# Patient Record
Sex: Female | Born: 1940 | Race: White | Hispanic: No | Marital: Married | Smoking: Former smoker
Health system: Southern US, Community
[De-identification: ages and names within clinical notes are randomized; demographics above are authoritative.]

## PROBLEM LIST (undated history)

## (undated) DIAGNOSIS — I10 Essential (primary) hypertension: Secondary | ICD-10-CM

## (undated) DIAGNOSIS — Z8719 Personal history of other diseases of the digestive system: Secondary | ICD-10-CM

## (undated) DIAGNOSIS — K219 Gastro-esophageal reflux disease without esophagitis: Secondary | ICD-10-CM

## (undated) DIAGNOSIS — N393 Stress incontinence (female) (male): Secondary | ICD-10-CM

## (undated) DIAGNOSIS — E785 Hyperlipidemia, unspecified: Secondary | ICD-10-CM

## (undated) DIAGNOSIS — H269 Unspecified cataract: Secondary | ICD-10-CM

## (undated) DIAGNOSIS — T7840XA Allergy, unspecified, initial encounter: Secondary | ICD-10-CM

## (undated) DIAGNOSIS — K759 Inflammatory liver disease, unspecified: Secondary | ICD-10-CM

## (undated) DIAGNOSIS — I701 Atherosclerosis of renal artery: Secondary | ICD-10-CM

## (undated) DIAGNOSIS — N952 Postmenopausal atrophic vaginitis: Secondary | ICD-10-CM

## (undated) DIAGNOSIS — M169 Osteoarthritis of hip, unspecified: Secondary | ICD-10-CM

## (undated) DIAGNOSIS — M1990 Unspecified osteoarthritis, unspecified site: Secondary | ICD-10-CM

## (undated) HISTORY — DX: Essential (primary) hypertension: I10

## (undated) HISTORY — PX: EYE SURGERY: SHX253

## (undated) HISTORY — PX: KNEE ARTHROSCOPY: SHX127

## (undated) HISTORY — PX: OTHER SURGICAL HISTORY: SHX169

## (undated) HISTORY — DX: Hyperlipidemia, unspecified: E78.5

## (undated) HISTORY — DX: Unspecified cataract: H26.9

## (undated) HISTORY — PX: CATARACT EXTRACTION: SUR2

## (undated) HISTORY — DX: Stress incontinence (female) (male): N39.3

## (undated) HISTORY — DX: Postmenopausal atrophic vaginitis: N95.2

## (undated) HISTORY — DX: Allergy, unspecified, initial encounter: T78.40XA

## (undated) HISTORY — DX: Atherosclerosis of renal artery: I70.1

---

## 1978-02-21 HISTORY — PX: AUGMENTATION MAMMAPLASTY: SUR837

## 1993-02-21 HISTORY — PX: CARPAL TUNNEL RELEASE: SHX101

## 2000-05-30 ENCOUNTER — Ambulatory Visit (HOSPITAL_BASED_OUTPATIENT_CLINIC_OR_DEPARTMENT_OTHER): Admission: RE | Admit: 2000-05-30 | Discharge: 2000-05-30 | Payer: Self-pay | Admitting: Orthopedic Surgery

## 2000-11-27 ENCOUNTER — Other Ambulatory Visit: Admission: RE | Admit: 2000-11-27 | Discharge: 2000-11-27 | Payer: Self-pay | Admitting: Obstetrics and Gynecology

## 2001-12-10 ENCOUNTER — Other Ambulatory Visit: Admission: RE | Admit: 2001-12-10 | Discharge: 2001-12-10 | Payer: Self-pay | Admitting: Obstetrics and Gynecology

## 2002-11-05 ENCOUNTER — Encounter: Admission: RE | Admit: 2002-11-05 | Discharge: 2002-11-05 | Payer: Self-pay | Admitting: Sports Medicine

## 2002-12-03 ENCOUNTER — Encounter: Admission: RE | Admit: 2002-12-03 | Discharge: 2002-12-03 | Payer: Self-pay | Admitting: Sports Medicine

## 2002-12-11 ENCOUNTER — Encounter: Payer: Self-pay | Admitting: Sports Medicine

## 2002-12-11 ENCOUNTER — Encounter: Admission: RE | Admit: 2002-12-11 | Discharge: 2002-12-11 | Payer: Self-pay | Admitting: Sports Medicine

## 2002-12-31 ENCOUNTER — Encounter: Admission: RE | Admit: 2002-12-31 | Discharge: 2002-12-31 | Payer: Self-pay | Admitting: Sports Medicine

## 2003-03-18 ENCOUNTER — Encounter: Admission: RE | Admit: 2003-03-18 | Discharge: 2003-03-18 | Payer: Self-pay | Admitting: Sports Medicine

## 2003-03-26 ENCOUNTER — Other Ambulatory Visit: Admission: RE | Admit: 2003-03-26 | Discharge: 2003-03-26 | Payer: Self-pay | Admitting: Obstetrics and Gynecology

## 2003-04-01 ENCOUNTER — Ambulatory Visit (HOSPITAL_COMMUNITY): Admission: RE | Admit: 2003-04-01 | Discharge: 2003-04-01 | Payer: Self-pay | Admitting: Gastroenterology

## 2003-05-05 ENCOUNTER — Ambulatory Visit (HOSPITAL_COMMUNITY): Admission: RE | Admit: 2003-05-05 | Discharge: 2003-05-05 | Payer: Self-pay | Admitting: Nephrology

## 2003-05-05 ENCOUNTER — Encounter: Payer: Self-pay | Admitting: Nephrology

## 2003-05-22 ENCOUNTER — Ambulatory Visit (HOSPITAL_COMMUNITY): Admission: RE | Admit: 2003-05-22 | Discharge: 2003-05-22 | Payer: Self-pay | Admitting: Nephrology

## 2004-05-10 ENCOUNTER — Ambulatory Visit (HOSPITAL_COMMUNITY): Admission: RE | Admit: 2004-05-10 | Discharge: 2004-05-10 | Payer: Self-pay | Admitting: Family Medicine

## 2004-05-13 ENCOUNTER — Other Ambulatory Visit: Admission: RE | Admit: 2004-05-13 | Discharge: 2004-05-13 | Payer: Self-pay | Admitting: Addiction Medicine

## 2004-10-06 ENCOUNTER — Other Ambulatory Visit: Admission: RE | Admit: 2004-10-06 | Discharge: 2004-10-06 | Payer: Self-pay | Admitting: Obstetrics and Gynecology

## 2004-12-22 ENCOUNTER — Other Ambulatory Visit: Admission: RE | Admit: 2004-12-22 | Discharge: 2004-12-22 | Payer: Self-pay | Admitting: Obstetrics and Gynecology

## 2005-05-04 ENCOUNTER — Ambulatory Visit (HOSPITAL_COMMUNITY): Admission: RE | Admit: 2005-05-04 | Discharge: 2005-05-04 | Payer: Self-pay | Admitting: Nephrology

## 2005-06-09 ENCOUNTER — Other Ambulatory Visit: Admission: RE | Admit: 2005-06-09 | Discharge: 2005-06-09 | Payer: Self-pay | Admitting: Obstetrics and Gynecology

## 2005-09-13 ENCOUNTER — Emergency Department (HOSPITAL_COMMUNITY): Admission: EM | Admit: 2005-09-13 | Discharge: 2005-09-13 | Payer: Self-pay | Admitting: Family Medicine

## 2006-02-21 HISTORY — PX: OTHER SURGICAL HISTORY: SHX169

## 2006-02-27 ENCOUNTER — Ambulatory Visit: Payer: Self-pay

## 2006-06-14 ENCOUNTER — Other Ambulatory Visit: Admission: RE | Admit: 2006-06-14 | Discharge: 2006-06-14 | Payer: Self-pay | Admitting: Obstetrics and Gynecology

## 2006-09-11 ENCOUNTER — Ambulatory Visit: Payer: Self-pay

## 2007-03-07 ENCOUNTER — Other Ambulatory Visit: Admission: RE | Admit: 2007-03-07 | Discharge: 2007-03-07 | Payer: Self-pay | Admitting: Obstetrics and Gynecology

## 2007-03-26 ENCOUNTER — Ambulatory Visit: Payer: Self-pay

## 2007-12-21 ENCOUNTER — Encounter: Payer: Self-pay | Admitting: Women's Health

## 2007-12-21 ENCOUNTER — Other Ambulatory Visit: Admission: RE | Admit: 2007-12-21 | Discharge: 2007-12-21 | Payer: Self-pay | Admitting: Obstetrics and Gynecology

## 2007-12-21 ENCOUNTER — Ambulatory Visit: Payer: Self-pay | Admitting: Women's Health

## 2008-03-10 ENCOUNTER — Ambulatory Visit: Payer: Self-pay

## 2008-07-17 ENCOUNTER — Ambulatory Visit: Payer: Self-pay | Admitting: Internal Medicine

## 2008-11-11 ENCOUNTER — Ambulatory Visit: Payer: Self-pay | Admitting: Obstetrics and Gynecology

## 2009-01-05 ENCOUNTER — Ambulatory Visit: Payer: Self-pay | Admitting: Internal Medicine

## 2009-02-05 ENCOUNTER — Ambulatory Visit: Payer: Self-pay | Admitting: Internal Medicine

## 2009-04-20 ENCOUNTER — Encounter: Payer: Self-pay | Admitting: Cardiovascular Disease

## 2009-04-20 ENCOUNTER — Ambulatory Visit: Payer: Self-pay

## 2009-04-20 DIAGNOSIS — N183 Chronic kidney disease, stage 3 unspecified: Secondary | ICD-10-CM | POA: Insufficient documentation

## 2009-04-30 ENCOUNTER — Ambulatory Visit: Payer: Self-pay | Admitting: Women's Health

## 2009-04-30 ENCOUNTER — Other Ambulatory Visit: Admission: RE | Admit: 2009-04-30 | Discharge: 2009-04-30 | Payer: Self-pay | Admitting: Obstetrics and Gynecology

## 2009-05-11 ENCOUNTER — Ambulatory Visit: Payer: Self-pay | Admitting: Sports Medicine

## 2009-05-11 DIAGNOSIS — M7520 Bicipital tendinitis, unspecified shoulder: Secondary | ICD-10-CM | POA: Insufficient documentation

## 2009-05-11 DIAGNOSIS — M25519 Pain in unspecified shoulder: Secondary | ICD-10-CM | POA: Insufficient documentation

## 2009-05-20 ENCOUNTER — Telehealth (INDEPENDENT_AMBULATORY_CARE_PROVIDER_SITE_OTHER): Payer: Self-pay | Admitting: *Deleted

## 2009-06-30 ENCOUNTER — Ambulatory Visit: Payer: Self-pay | Admitting: Internal Medicine

## 2009-11-21 DEATH — deceased

## 2010-01-22 ENCOUNTER — Ambulatory Visit: Payer: Self-pay | Admitting: Internal Medicine

## 2010-02-01 ENCOUNTER — Ambulatory Visit: Payer: Self-pay | Admitting: Internal Medicine

## 2010-02-23 ENCOUNTER — Ambulatory Visit: Admit: 2010-02-23 | Payer: Self-pay

## 2010-02-23 ENCOUNTER — Ambulatory Visit: Admission: RE | Admit: 2010-02-23 | Discharge: 2010-02-23 | Payer: Self-pay | Source: Home / Self Care

## 2010-02-23 DIAGNOSIS — M2420 Disorder of ligament, unspecified site: Secondary | ICD-10-CM | POA: Insufficient documentation

## 2010-02-23 DIAGNOSIS — M629 Disorder of muscle, unspecified: Secondary | ICD-10-CM | POA: Insufficient documentation

## 2010-02-23 DIAGNOSIS — M25559 Pain in unspecified hip: Secondary | ICD-10-CM | POA: Insufficient documentation

## 2010-03-01 ENCOUNTER — Encounter (INDEPENDENT_AMBULATORY_CARE_PROVIDER_SITE_OTHER): Payer: Self-pay | Admitting: *Deleted

## 2010-03-01 ENCOUNTER — Telehealth (INDEPENDENT_AMBULATORY_CARE_PROVIDER_SITE_OTHER): Payer: Self-pay | Admitting: *Deleted

## 2010-03-09 ENCOUNTER — Ambulatory Visit: Admit: 2010-03-09 | Payer: Self-pay | Admitting: Family Medicine

## 2010-03-09 ENCOUNTER — Ambulatory Visit
Admission: RE | Admit: 2010-03-09 | Discharge: 2010-03-09 | Payer: Self-pay | Source: Home / Self Care | Attending: Family Medicine | Admitting: Family Medicine

## 2010-03-09 ENCOUNTER — Encounter
Admission: RE | Admit: 2010-03-09 | Discharge: 2010-03-09 | Payer: Self-pay | Source: Home / Self Care | Attending: Family Medicine | Admitting: Family Medicine

## 2010-03-09 DIAGNOSIS — M549 Dorsalgia, unspecified: Secondary | ICD-10-CM | POA: Insufficient documentation

## 2010-03-14 ENCOUNTER — Encounter: Payer: Self-pay | Admitting: Family Medicine

## 2010-03-14 ENCOUNTER — Encounter: Payer: Self-pay | Admitting: Nephrology

## 2010-03-23 NOTE — Progress Notes (Signed)
  FAxed Doppler over to [facility] fax [phone] [facility]  May 20, 2009 3:47 PM

## 2010-03-23 NOTE — Assessment & Plan Note (Signed)
Summary: R BICEP PAIN X MARCH 4TH   Vital Signs:  Patient profile:   70 year old female Height:      61 inches Weight:      138 pounds BMI:     26.17 BP sitting:   119 / 76  Vitals Entered By: Lillia Pauls CMA (May 11, 2009 9:14 AM)  History of Present Illness: Charlotte Duarte had a sudden jerk of her arm while holding a horse on march 3 she had pain, swelling and bruising into her RT upper arm pain is much better and discoloration is almost faded comes for eval feels that RT arm is weak now has some pain up into shoulder as well - more post  Physical Exam  General:  Well-developed,well-nourished,in no acute distress; alert,appropriate and cooperative throughout examination Msk:  Inspection reveals no abnormalities or assymetry; no atrophy noted; palpation is unremarkable;  ROM is full in all planes. specific strength testing of Rotator cuff mm reveals good strength throughout; no signs of impingement;  speeds and yergason's tests mild pain but uncomfortable to any strong resistance     negative painful arc and no drop arm sign.  ER was somewhat painful to testing and seems weaker than left  RT humerus shows some discoloration in mid arm tender at distal biceps tendon and actually less so at prox biceps tendon  Additional Exam:  MSK [place] There is a split biceps tendon noted in the groove no excessive edema in that area about 4 cms distal there is fluid lining tendon sheath there is a discreet area of calcifcation in tendon and a fluid pocket with 3 calcifications  note quick scan of Infraspinatus reveals a rounded calcific density in the MM belly distally  images saved   Impression & Recommendations:  Problem # 1:  SHOULDER PAIN, RIGHT (ICD-719.41)  This is upper shoulder and arm I think most of sxs are referred from biceps I think findings in RC are from older chronic probs - I did inject her in past years for SAB  Orders: [place] LIMITED ([id number])  consider full shoulde scan  with [place] if sxs persist  Problem # 2:  BICEPS TENDINITIS, RIGHT (ICD-726.12)  Calcific change and swelling in tendon sheath suspect this is a healing partial tear no hx of abnorm calcification with her renal Dz which is not problematic at this point  Will give her a series of biceps exercises - theraband x 2 wks progress to lt weight p 2 wks ice if needed as needed pain meds  reck 1 mo to repeat scan to see if swelling resolving  should see steady progress with strength  Orders: [place] LIMITED ([id number])

## 2010-03-23 NOTE — Miscellaneous (Signed)
Summary: Orders Update  Clinical Lists Changes  Problems: Added new problem of RENAL DISEASE, CHRONIC, STAGE III (ICD-585.3) Orders: Added new Test order of Renal Artery Duplex (Renal Artery Duplex) - Signed 

## 2010-03-25 NOTE — Assessment & Plan Note (Signed)
Summary: It band problem x 3 wks/bmc   Vital Signs:  Patient profile:   70 year old female Height:      60 inches Weight:      145 pounds BMI:     28.42 Pulse rate:   76 / minute BP sitting:   112 / 75  (right arm)  Vitals Entered By: Lillia Pauls CMA (February 23, 2010 2:10 PM)  History of Present Illness: 3 weeks of increasing dull aching pain in R posterior and lateral hip that radiates down somewhat and is associated with R shin pain.  No numbness or tingling.  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    Pain limits activity to 23m/day walk.  Cannot hike or do 11m walk, gardening.    No NSAIDs normally but 3 ibuprofen in last 3 weeks. (reduced GFR baseline)  Heat does not help but ice and stretching help some.  Did some deep tissue myofascial work with Ms. Grant Ruts, 3 sessions, helped some. Has been doing some self massage with a tennis ball, R posterior buttock.  Past History:  Past medical, surgical, family and social histories (including risk factors) reviewed, and no changes noted (except as noted below).  Past Medical History: reduced GFR biceps tendonitis  Family History: Reviewed history and no changes required.  Social History: Reviewed history and no changes required. 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 Otherwise the pertinent positives of the ROS are noted above.    Physical Exam  General:  Well-developed,well-nourished,in no acute distress; alert,appropriate and cooperative throughout examination Head:  Normocephalic and atraumatic without obvious abnormalities. No apparent alopecia  or balding. Ears:  no external deformities.   Nose:  no external deformity.   Neck:  No deformities, masses, or tenderness noted. Lungs:  normal respiratory effort.   Msk:  Full ROM in back with no pain on palpation.  R Hip: Some limitation of ROM in R hip internal and external rotation. Slight on abduction, but a negative C sign   Pain along medial aspect with external rotation.    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  Will give exercise regimen to strengthen hip and leg abductors.  >25 minutes spent in face to face time with patient, >50% spent in counselling or coordination of care  The patient was given a handout from Dr. Ailene Ards book "The Sports Medicine Patient Advisor" describing the anatomy and rehabilitation of the following condition: ITBS, new book  also hip flexion, abd, ext  I think this is secondary, poor pelvic control, weak abductors, ITBS, TFL irritation.  Problem # 2:  HIP PAIN, RIGHT (ICD-719.45) Assessment: New   Orders Added: 1)  Est. Patient Level IV [16109]

## 2010-03-25 NOTE — Miscellaneous (Signed)
Summary: Orders Update  Clinical Lists Changes  Problems: Added new problem of RENAL DISEASE, CHRONIC, STAGE III (ICD-585.3) Orders: Added new Test order of Renal Artery Duplex (Renal Artery Duplex) - Signed 

## 2010-03-25 NOTE — Progress Notes (Signed)
  Per pt she would like to try tramadol to have something on had for severe pain.  [facility] [place] [facility].   ---- Converted from flag ---- ---- 03/01/2010 2:57 PM, Hannah Beat MD wrote: with a GFR of 37, i would avoid the routine NSAIDS. I am not sure how helpful they will be with her problem. Encourage continued rehab and massage, strengthening to help long term. Tylenol OK.  Tramadol 50 mg, 1 by mouth  4 times daily  #50 if needed for pain 4 times daily    ---- 03/01/2010 2:52 PM, Rochele Pages RN wrote: Her creat as of 01/19/10 was 1.4 and GFR was 37   ---- 03/01/2010 9:39 AM, Hannah Beat MD wrote: find out what her creatinine and GFR are -- i think she will know what they are. if not, try to find by calling the lab.  ---- 03/01/2010 9:31 AM, Jceon Alverio Jake Shark RN wrote: Pt called states her ITB pain is better at night, but worse during the day now.  States she feels weakness in hip and knee.  Has been compliant with exercises since appt.  She says NSAIDs are very helpful for her discomfort, but she has only taken 4 ibuprofen in the past 2 weeks d/t decreased GFR.  Wants to know if it is ok to take NSAIDs? ------------------------------

## 2010-03-25 NOTE — Assessment & Plan Note (Signed)
Summary: ITB STILL HAVING A LOT OF PAIN/MJD   Vital Signs:  Patient profile:   70 year old female BP sitting:   113 / 72  Vitals Entered By: Rochele Pages RN (March 09, 2010 2:34 PM)  History of Present Illness: 70yo female to office for f/u on R hip/leg pain.  Last evaluated 02/23/10 & dx'd with ITB syndrome.  Has been doing stretches/exercises as directed, but pain is not improving.  Pain currently in her right buttock, but radiates down lateral aspect of her leg, past the knee, & into lateral lower leg & top of foot.  Pain described as deep achey pain in the buttock area, but more of a stinging/burning pain in lower leg.  Does have some groin pain.  Pain worse through the day & is affecting simple ADLs.   Sleeping ok, but feels restless.  Taking ibuprofen 200mg  three times a day without improvement, unable to take higher doses of NSAIDs due to CKD stage III.  Denies any low back pain.  Denies any numbness or tingling.  Denies change in bowel/bladder.  Scheduled to go on trip in [place] on Saturday.  Past History:  Past Medical History: reduced GFR - CKD stage III biceps tendonitis  Social History: Reviewed history from 02/23/2010 and no changes required. 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 Otherwise the pertinent positives of the ROS are noted above.     Physical Exam  General:  Well-developed,well-nourished,in no acute distress; alert,appropriate and cooperative throughout examination Msk:  HIPS:  - R hip: slightly decreased ROM in all planes, reproducible buttock & groin pain with internal & external rotation.  (+)log roll.  Mild weakness with flexion, ext, abduction, adduction compared to L  hip, but improved from last evaluation.  No TTP over greater troch, illiac crest, or proximal ITB.  (+)medial thigh pain with FABER, neg pretzel stretch, overall good SI-joint mobility. - L hip: FROM without pain.  normal strength.  Neg FABER, neg log roll, neg pretzel stretch.  good SI-joint mobility.  KNEES: FROM without pain.  Tight ITB on right.  No joint line tenderness.  No effusion.  No ligamentous laxity.  BACK: no deformity or scoliosis.  ROM with normal flexion, decreased extension with some pain, decreases Rt sidebending, decreased Rt rotation.  No midline or paraspinal muscle tenderness.  No tenderness over SI-joint, piriformis b/l.  neg SLR.  Able to toe walk & heel walk. Pulses:  +2/4 lower ext b/l Neurologic:  alert & oriented X3 and sensation intact to light touch.   DTR +2/4 achilles, patella b/l   Impression & Recommendations:  Problem # 1:  HIP PAIN, RIGHT (ICD-719.45) - R leg symptoms may be related to referred pain from the hip or possible the back. - check x-ray of R hip to evaluate for arthritits - Cont. activity as tolerated - Start prednisone dose pack to help with symptoms.  Should not take ibuprofen while using this medication.  Ok to stop medication if having any signs of psychosis related to steroids. - f/u 47-month for re-evaluation.  Her updated medication list for this problem includes:    Tramadol Hcl 50 Mg Tabs (Tramadol hcl) .Marland Kitchen... Take 1 by mouth four times per day  as needed for pain  Orders: Radiology other (Radiology Other)  Her updated medication list for this problem includes:    Tramadol Hcl 50 Mg Tabs (Tramadol hcl) .Marland Kitchen... Take 1 by mouth four times per day as needed for pain  Problem # 2:  BACK PAIN (ICD-724.5) - R leg symptoms may be related to referred pain from the back or hip - Check x-ray of lumbar spine - Cont. activity as tolerated - Prednisone dose pack as stated above - f/u 25-month for re-evaluation  Her updated medication list for  this problem includes:    Tramadol Hcl 50 Mg Tabs (Tramadol hcl) .Marland Kitchen... Take 1 by mouth four times per day as needed for pain  Her updated medication list for this problem includes:    Tramadol Hcl 50 Mg Tabs (Tramadol hcl) .Marland Kitchen... Take 1 by mouth four times per day as needed for pain  Complete Medication List: 1)  Tramadol Hcl 50 Mg Tabs (Tramadol hcl) .... Take 1 by mouth four times per day as needed for pain 2)  Prednisone 10 Mg Tabs (Prednisone) .... 4 tabs by mouth x 5 days, then 3 tabs by mouth x 3 days, then 2 tabs by mouth x 3 days, then 1 tab by mouth x 3 days 3)  Benazepril  4)  Triamterine  5)  Plavix 75 Mg Tabs (Clopidogrel bisulfate) .Marland Kitchen.. 1 tab by mouth daily 6)  Lipitor   Patient Instructions: 1)  XRAYS 2)  follow-up in 1 month Prescriptions: PREDNISONE 10 MG TABS (PREDNISONE) 4 tabs by mouth x 5 days, then 3 tabs by mouth x 3 days, then 2 tabs by mouth x 3 days, then 1 tab by mouth x 3 days  #38 x 0   Entered and Authorized by:   Hannah Beat MD   Signed by:   Hannah Beat MD on 03/09/2010   Method used:   [facility]  [place] [address]       [address] [place] [address]       [place], [place]  [address]       Ph: [phone] or [phone]       Fax: [phone]   RxID:   [id number]    Orders Added: 1)  Radiology other [Radiology Other] 2)  Est. Patient Level IV [03474]

## 2010-03-25 NOTE — Miscellaneous (Signed)
  Clinical Lists Changes  Medications: Added new medication of TRAMADOL HCL 50 MG TABS (TRAMADOL HCL) take 1 by mouth four times per day as needed for pain - Signed Rx of TRAMADOL HCL 50 MG TABS (TRAMADOL HCL) take 1 by mouth four times per day as needed for pain;  #50 x 0;  Signed;  Entered by: Rochele Pages RN;  Authorized by: Hannah Beat MD;  Method used: [facility]  [place] [address], [place], [place]  [address], Ph: [phone] or [phone], Fax: [phone]    Prescriptions: TRAMADOL HCL 50 MG TABS (TRAMADOL HCL) take 1 by mouth four times per day as needed for pain  #50 x 0   Entered by:   Rochele Pages RN   Authorized by:   Hannah Beat MD   Signed by:   Rochele Pages RN on 03/01/2010   Method used:   [facility]phoned to ...       [facility]  [place] [address] (retail)       [address]       [place], [place]  [address]       Ph: [phone] or [phone]       Fax: [phone]   RxID:   [id number]  Called into pharmacy per Dr. Patsy Lager and pt request. Rochele Pages RN  March 01, 2010 4:24 PM

## 2010-07-09 NOTE — Op Note (Signed)
[facility]  Patient:    GRACIELA, PLATO [facility]                         MRN: [id number] Proc. Date: 05/30/00 Attending:  Elana Alm. Thurston Hole, M.D.                           Operative Report  PREOPERATIVE DIAGNOSIS:  Left knee medial meniscus tear.  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  PROCEDURE: 1. Left knee examination under anesthesia followed by arthroscopic partial    medial meniscectomy. 2. Left knee chondroplasty.  SURGEON:  Elana Alm. Thurston Hole, M.D.  ASSISTANT:  Julien Girt, P.A.  ANESTHESIA:  Local and MAC.  OPERATIVE TIME:  30 minutes.  COMPLICATIONS:  None.  INDICATIONS:  Ms. Crosley is a 70 year old woman, who has had significant left knee pain for the past three months, increasing in nature, with signs and symptoms consistent with medial meniscus tear, confirmed by MRI, who has failed conservative care and is now to undergo arthroscopy.  DESCRIPTION OF PROCEDURE:  Ms. Pfarr was brought to the operating room on May 30, 2000, after a block had been placed in the holding room.  Placed on the operating table in the supine position.  Her left knee was examined under anesthesia.  Range of motion 0-125 degrees, 1-2+ crepitation.  Knee stable. Ligament is examined and normal patella tracking.  Left leg was prepped using sterile Betadine and draped using sterile technique.  Originally through an inferolateral portal, the arthroscope with the pump attachment was placed in through an inferior medial portal and arthroscopic probe was placed.  On initial inspection of the medial compartment, she was found to have 50-60% grade 3 chondromalacia which was thoroughly debrided.  Medial meniscus was probed.  She had a complex tear of the posterior medial horn of which 50% was resected back to a stable rim.  Intercondylar notch inspected.  Anterior and posterior  cruciate ligaments were normal.  Lateral compartment inspected. Only mild grade 1 and 2 chondromalacia noted.  Lateral meniscus was intact. Patellofemoral joint inspected.  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  The patella tracked normally.  Moderate synovitis in the medial and lateral gutters was debrided. Otherwise this was free of pathology.  After this was done, it was felt that all pathology had been satisfactorily addressed.  The instruments were removed.  The portals were closed with 3-0 nylon suture and injected with 0.25% Marcaine with epinephrine and 4 mg of morphine.  Sterile dressing applied, and the patient awakened and taken to the recovery room in stable condition.  FOLLOW-UP CARE:  Ms. Stuckert will be followed as an outpatient on Vicodin and Naprosyn.  See her back in the office for sutures out and follow-up. DD:  05/30/00 TD:  05/30/00 Job: [id number]

## 2010-07-26 ENCOUNTER — Other Ambulatory Visit: Payer: Medicare Other | Admitting: Internal Medicine

## 2010-07-26 ENCOUNTER — Other Ambulatory Visit: Payer: Self-pay | Admitting: Internal Medicine

## 2010-07-26 DIAGNOSIS — Z79899 Other long term (current) drug therapy: Secondary | ICD-10-CM

## 2010-07-26 DIAGNOSIS — E785 Hyperlipidemia, unspecified: Secondary | ICD-10-CM

## 2010-07-26 LAB — HEPATIC FUNCTION PANEL
ALT: 20 U/L (ref 0–35)
AST: 28 U/L (ref 0–37)
Albumin: 4.5 g/dL (ref 3.5–5.2)
Total Protein: 6.6 g/dL (ref 6.0–8.3)

## 2010-07-26 LAB — LIPID PANEL
Cholesterol: 157 mg/dL (ref 0–200)
HDL: 45 mg/dL (ref >39)
Total CHOL/HDL Ratio: 3.5 Ratio
Triglycerides: 136 mg/dL (ref <150)
VLDL: 27 mg/dL (ref 0–40)

## 2010-07-27 ENCOUNTER — Other Ambulatory Visit: Payer: Self-pay | Admitting: Internal Medicine

## 2010-07-27 ENCOUNTER — Encounter: Payer: Self-pay | Admitting: Internal Medicine

## 2010-07-27 ENCOUNTER — Ambulatory Visit (INDEPENDENT_AMBULATORY_CARE_PROVIDER_SITE_OTHER): Payer: Medicare Other | Admitting: Internal Medicine

## 2010-07-27 DIAGNOSIS — E785 Hyperlipidemia, unspecified: Secondary | ICD-10-CM

## 2010-07-27 DIAGNOSIS — I10 Essential (primary) hypertension: Secondary | ICD-10-CM

## 2010-07-27 LAB — BASIC METABOLIC PANEL
CO2: 25 mEq/L (ref 19–32)
Calcium: 9.9 mg/dL (ref 8.4–10.5)
Chloride: 102 mEq/L (ref 96–112)
Glucose, Bld: 93 mg/dL (ref 70–99)
Potassium: 4.1 mEq/L (ref 3.5–5.3)
Sodium: 139 mEq/L (ref 135–145)

## 2010-07-27 NOTE — Progress Notes (Signed)
  Subjective:    Patient ID: Charlotte Duarte, female    DOB: 30-Jan-1941, 70 y.o.   MRN: [id number]  HPI Pt has had considerable problem with right hip pain. Had MRI at [facility] and no herniated disc was found. For four months had considerable pain. She cancelled appt with Dr. Despina Hick for consult re: hip pain. Has been doing rolffing which has helped. Also here for followup of hyperlipidemia, CKD Stage 3, and HTN.    Review of SystemsNoncontributory- retired at end of December- able to walk and exercise again.     Objective:   Physical ExamNeck: No JVD, No thyromegaly, Chest clear Cor RRR without murmur. Ext without edema.          Assessment & Plan:  1-CKD Stage 3 with hx renal artery stenosis--B-met added to labs 2-Hyperlipidemia- Lipid panel and liver fxs reviewed with pt. 3- Right hip pain- improving with conservative therapy 4- Health maintenance-order given for Mammogram at [facility], Pt to contact [facility] about colonoscopy. Last one was with Dr. Sherin Quarry who has retired. Book CPE early Dec 2012 here and see Dr. Eda Paschal, GYN soon

## 2010-07-27 NOTE — Patient Instructions (Signed)
Continue same meds. Return in 6 months. See GYN. Get mammogram and colonoscopy.

## 2010-08-02 ENCOUNTER — Other Ambulatory Visit: Payer: Self-pay | Admitting: *Deleted

## 2010-08-02 MED ORDER — ATORVASTATIN CALCIUM 20 MG PO TABS: 20.0000 mg | ORAL_TABLET | Freq: Every day | ORAL | Status: DC

## 2010-08-02 MED ORDER — TRIAMTERENE-HCTZ 75-50 MG PO TABS: 1.0000 | ORAL_TABLET | Freq: Every day | ORAL | Status: DC

## 2010-08-02 MED ORDER — BENAZEPRIL HCL 10 MG PO TABS: 10.0000 mg | ORAL_TABLET | Freq: Every day | ORAL | Status: DC

## 2010-08-02 MED ORDER — FLUTICASONE PROPIONATE 50 MCG/ACT NA SUSP: 2.0000 | Freq: Every day | NASAL | Status: DC

## 2010-08-02 MED ORDER — CLOPIDOGREL BISULFATE 75 MG PO TABS: 75.0000 mg | ORAL_TABLET | Freq: Every day | ORAL | Status: DC

## 2010-08-27 ENCOUNTER — Encounter: Payer: Medicare Other | Admitting: Obstetrics and Gynecology

## 2010-09-03 ENCOUNTER — Encounter (INDEPENDENT_AMBULATORY_CARE_PROVIDER_SITE_OTHER): Payer: Medicare Other | Admitting: Obstetrics and Gynecology

## 2010-09-03 ENCOUNTER — Other Ambulatory Visit: Payer: Self-pay | Admitting: Obstetrics and Gynecology

## 2010-09-03 ENCOUNTER — Other Ambulatory Visit (HOSPITAL_COMMUNITY)
Admission: RE | Admit: 2010-09-03 | Discharge: 2010-09-03 | Disposition: A | Discharge status: Home / Self Care | Payer: Medicare Other | Source: Ambulatory Visit | Attending: Obstetrics and Gynecology | Admitting: Obstetrics and Gynecology

## 2010-09-03 DIAGNOSIS — N952 Postmenopausal atrophic vaginitis: Secondary | ICD-10-CM

## 2010-09-03 DIAGNOSIS — Z124 Encounter for screening for malignant neoplasm of cervix: Secondary | ICD-10-CM | POA: Insufficient documentation

## 2010-09-03 DIAGNOSIS — R350 Frequency of micturition: Secondary | ICD-10-CM

## 2010-09-03 DIAGNOSIS — N393 Stress incontinence (female) (male): Secondary | ICD-10-CM

## 2010-09-07 DIAGNOSIS — N393 Stress incontinence (female) (male): Secondary | ICD-10-CM | POA: Insufficient documentation

## 2010-09-07 DIAGNOSIS — N952 Postmenopausal atrophic vaginitis: Secondary | ICD-10-CM | POA: Insufficient documentation

## 2010-09-14 ENCOUNTER — Encounter: Payer: Self-pay | Admitting: Internal Medicine

## 2011-01-25 ENCOUNTER — Other Ambulatory Visit: Payer: Medicare Other | Admitting: Internal Medicine

## 2011-01-25 DIAGNOSIS — E785 Hyperlipidemia, unspecified: Secondary | ICD-10-CM

## 2011-01-25 DIAGNOSIS — I10 Essential (primary) hypertension: Secondary | ICD-10-CM

## 2011-01-25 DIAGNOSIS — Z0000 Encounter for general adult medical examination without abnormal findings: Secondary | ICD-10-CM

## 2011-01-25 LAB — COMPREHENSIVE METABOLIC PANEL
AST: 27 U/L (ref 0–37)
Alkaline Phosphatase: 70 U/L (ref 39–117)
BUN: 33 mg/dL — ABNORMAL HIGH (ref 6–23)
Creat: 1.69 mg/dL — ABNORMAL HIGH (ref 0.50–1.10)

## 2011-01-25 LAB — CBC WITH DIFFERENTIAL/PLATELET
Basophils Absolute: 0 10*3/uL (ref 0.0–0.1)
Basophils Relative: 1 % (ref 0–1)
Eosinophils Relative: 4 % (ref 0–5)
HCT: 39.1 % (ref 36.0–46.0)
MCHC: 33.2 g/dL (ref 30.0–36.0)
MCV: 88.7 fL (ref 78.0–100.0)
Monocytes Absolute: 0.7 10*3/uL (ref 0.1–1.0)
Neutro Abs: 5.1 10*3/uL (ref 1.7–7.7)
Platelets: 258 10*3/uL (ref 150–400)
RDW: 13.3 % (ref 11.5–15.5)

## 2011-01-25 LAB — LIPID PANEL
Cholesterol: 159 mg/dL (ref 0–200)
HDL: 51 mg/dL (ref >39)
Total CHOL/HDL Ratio: 3.1 Ratio
Triglycerides: 86 mg/dL (ref <150)
VLDL: 17 mg/dL (ref 0–40)

## 2011-01-27 ENCOUNTER — Ambulatory Visit (INDEPENDENT_AMBULATORY_CARE_PROVIDER_SITE_OTHER): Payer: Medicare Other | Admitting: Internal Medicine

## 2011-01-27 VITALS — BP 118/74 | HR 68 | Temp 97.9°F | Resp 12 | Ht 60.0 in | Wt 147.5 lb

## 2011-01-27 DIAGNOSIS — K219 Gastro-esophageal reflux disease without esophagitis: Secondary | ICD-10-CM

## 2011-01-27 DIAGNOSIS — E785 Hyperlipidemia, unspecified: Secondary | ICD-10-CM

## 2011-01-27 DIAGNOSIS — N183 Chronic kidney disease, stage 3 unspecified: Secondary | ICD-10-CM

## 2011-01-27 DIAGNOSIS — I10 Essential (primary) hypertension: Secondary | ICD-10-CM

## 2011-01-27 DIAGNOSIS — Z0000 Encounter for general adult medical examination without abnormal findings: Secondary | ICD-10-CM

## 2011-01-27 LAB — POCT URINALYSIS DIPSTICK
Protein, UA: NEGATIVE
Spec Grav, UA: 1.01
Urobilinogen, UA: NEGATIVE
pH, UA: 7

## 2011-02-02 ENCOUNTER — Other Ambulatory Visit: Payer: Self-pay | Admitting: Internal Medicine

## 2011-02-17 ENCOUNTER — Other Ambulatory Visit: Payer: Self-pay | Admitting: Internal Medicine

## 2011-02-21 ENCOUNTER — Encounter: Payer: Self-pay | Admitting: Internal Medicine

## 2011-02-21 DIAGNOSIS — K219 Gastro-esophageal reflux disease without esophagitis: Secondary | ICD-10-CM | POA: Insufficient documentation

## 2011-02-21 NOTE — Patient Instructions (Signed)
Continue same medications and return in 6 months 

## 2011-02-28 ENCOUNTER — Other Ambulatory Visit: Payer: Self-pay | Admitting: Women's Health

## 2011-02-28 ENCOUNTER — Encounter: Payer: Self-pay | Admitting: Women's Health

## 2011-02-28 ENCOUNTER — Ambulatory Visit (INDEPENDENT_AMBULATORY_CARE_PROVIDER_SITE_OTHER): Payer: Medicare Other | Admitting: Women's Health

## 2011-02-28 DIAGNOSIS — R350 Frequency of micturition: Secondary | ICD-10-CM

## 2011-02-28 DIAGNOSIS — N899 Noninflammatory disorder of vagina, unspecified: Secondary | ICD-10-CM

## 2011-02-28 DIAGNOSIS — N898 Other specified noninflammatory disorders of vagina: Secondary | ICD-10-CM

## 2011-02-28 LAB — URINALYSIS, ROUTINE W REFLEX MICROSCOPIC
Protein, ur: >300 mg/dL — AB
Urobilinogen, UA: 0.2 mg/dL (ref 0.0–1.0)

## 2011-02-28 LAB — WET PREP, GENITAL: Yeast Wet Prep HPF POC: NONE SEEN

## 2011-02-28 LAB — URINALYSIS, MICROSCOPIC ONLY: Casts: NONE SEEN

## 2011-02-28 MED ORDER — NITROFURANTOIN MONOHYD MACRO 100 MG PO CAPS: 100.0000 mg | ORAL_CAPSULE | Freq: Two times a day (BID) | ORAL | Status: AC

## 2011-02-28 MED ORDER — ESTRADIOL 0.1 MG/GM VA CREA: 2.0000 g | TOPICAL_CREAM | Freq: Every day | VAGINAL | Status: DC

## 2011-02-28 NOTE — Progress Notes (Signed)
Patient ID: Charlotte Duarte, female   DOB: 1940-10-08, 71 y.o.   MRN: [id number] Presents with a complaint of increased frequency, urgency, and  blood with urination for one day. Denies a fever. States has some vaginal irritation, mostly external, no discharge or odor. Uses Estrace cream twice weekly, small amount for dryness.  Exam: No CVAT, UA: large amount of blood, trace leukocytes. 11 to 20 - WBCs, TNTC - RBCs. External genitalia erythemic at introitus. Wet prep done with a Q-tip. Wet prep negative. No noted blood.  UTI  Plan: Macrobid one by mouth twice a day for 7 days with food. Will check urine culture. Encouraged to use A&D ointment at introitus. Aware of UTI prevention. Instructed to call if symptoms do not resolve.

## 2011-03-03 ENCOUNTER — Telehealth: Payer: Self-pay | Admitting: *Deleted

## 2011-03-03 MED ORDER — FLUCONAZOLE 150 MG PO TABS: 150.0000 mg | ORAL_TABLET | Freq: Once | ORAL | Status: AC

## 2011-03-03 NOTE — Telephone Encounter (Signed)
Patient was just treated with Macrobid for UTI this week.  Now c/o yeast.  Wants rx called in.  Please advise

## 2011-03-03 NOTE — Telephone Encounter (Signed)
Lm for patient to call

## 2011-03-03 NOTE — Telephone Encounter (Signed)
Patient informed.  Will use Diflucan.  Rx called in

## 2011-03-03 NOTE — Telephone Encounter (Signed)
Please call in Diflucan 150 mg for patient or Terazol 3 if patient prefers.

## 2011-03-20 ENCOUNTER — Encounter: Payer: Self-pay | Admitting: Internal Medicine

## 2011-03-20 NOTE — Progress Notes (Signed)
Subjective:    Patient ID: Charlotte Duarte, female    DOB: 1940/10/15, 71 y.o.   MRN: [id number]  HPI 71 year old White female with a hypertension, hyperlipidemia, history of renal artery stenosis, history of chronic kidney disease, history of right leg pain. History of MRI ordered by orthopedist February 2012. She had moderate canal stenosis with asymmetric disc bulge to the right with mild mass effect on the L5 nerve root at the L4-L5 spinal level. A grade 1 spondylolisthesis at L4-L5 due to severe facet arthrosis. Had anterior listhesis L3-L4 due to severe facet arthrosis.  Patient has history of renal artery stenosis with left renal artery PTCA with stent placed 05/22/2003. Had PCI of in stent stenosis 05/04/2005.Marland Kitchen She is a lifelong Plavix. Blood pressure control has been excellent. Stable creatinine in the 1.46-1.48 range. No proteinuria. Cholesterol has been well controlled. Estimated GFR has been 37 cc per minute on prior labs. She is followed by Dr. Eliott Nine for kidney disease. Has serial Doppler reassessments of renal artery since 2008. Kidney size is been stable.  Patient had breast augmentation in 1980 and subsequently had implant removal in 2007. Has had arthroscopy of both knees in 1990 and 1995 by Dr. Thurston Hole. History of allergic rhinitis. Had prior colonoscopy by Dr. Barnett Abu who has since retired. She is to check and see when next that he is due.  Patient has social alcohol consumption consisting approximately 10 alcoholic beverages per week. Has a lacto- ovo vegetarian. History of right bundle branch block. History of GE reflux. Zostavax vaccine given 10/27/2005.  Patient quit smoking in 1975. History of carpal, release 1993. Flexible sigmoidoscopy 1998. Old records indicate patient was scheduled to see Dr. Barnett Abu for colonoscopy in 2003. All records indicate she had tetanus immunization 1998.  Family history: Father died of acute MI at age 27. Mother with history of breast cancer and  aneurysm in her leg. Brother with history of brain aneurysm. Another brother with history of ALS who has been to [place] for stem cell transplantation. Patient has accompanied him to [place] for that treatment. Both parents with history of hypertension.  Patient is employed as a Theatre stage manager admission. Has a 4 year college degree. Is married.    Review of Systems  Constitutional: Negative.   HENT: Negative.   Eyes: Negative.   Respiratory: Negative.   Cardiovascular: Negative.   Gastrointestinal: Negative.   Genitourinary: Negative.   Musculoskeletal: Negative.   Neurological: Negative.   Hematological: Negative.   Psychiatric/Behavioral: Negative.        Objective:   Physical Exam  Vitals reviewed. Constitutional: She is oriented to person, place, and time. She appears well-developed and well-nourished. No distress.  HENT:  Head: Normocephalic and atraumatic.  Right Ear: External ear normal.  Left Ear: External ear normal.  Mouth/Throat: Oropharynx is clear and moist. No oropharyngeal exudate.  Eyes: Conjunctivae are normal. Pupils are equal, round, and reactive to light. Right eye exhibits no discharge. Left eye exhibits no discharge.  Neck: Normal range of motion. Neck supple. No JVD present. No thyromegaly present.  Cardiovascular: Normal rate, regular rhythm, normal heart sounds and intact distal pulses.   No murmur heard. Pulmonary/Chest: Effort normal and breath sounds normal. She has no wheezes. She has no rales.       Normal female   Abdominal: Soft. She exhibits no mass. There is no tenderness. There is no rebound.  Genitourinary:       Deferred to GYN  Lymphadenopathy:    She  has no cervical adenopathy.  Neurological: She is alert and oriented to person, place, and time. She has normal reflexes. No cranial nerve deficit. Coordination normal.  Skin: Skin is warm and dry. No rash noted.  Psychiatric: She has a normal mood and affect. Her behavior is normal. Judgment  and thought content normal.          Assessment & Plan:  Chronic kidney disease stage III with creatinine stable. Generally runs approximately 1.46. History of renal artery stent placement and PCI. On chronic Plavix therapy for that.  Hypertension  Hyperlipidemia  Allergic rhinitis  Lumbar spinal stenosis and anterioro listhesis  Lumbar disc L5 impingement her MRI February 2012 treated by [facility]  History of breast augmentation with removal of implants in 2007  Plan: Patient is to return for followup in 6 months with fasting lipid panel, liver functions, blood pressure check, and office visit. Continue followup for kidney disease with Dr. Eliott Nine.

## 2011-04-24 ENCOUNTER — Encounter: Payer: Self-pay | Admitting: Internal Medicine

## 2011-05-20 ENCOUNTER — Other Ambulatory Visit: Payer: Self-pay | Admitting: Internal Medicine

## 2011-06-03 ENCOUNTER — Other Ambulatory Visit: Payer: Self-pay | Admitting: Cardiology

## 2011-06-03 DIAGNOSIS — N189 Chronic kidney disease, unspecified: Secondary | ICD-10-CM

## 2011-06-03 DIAGNOSIS — I701 Atherosclerosis of renal artery: Secondary | ICD-10-CM

## 2011-06-09 ENCOUNTER — Encounter (INDEPENDENT_AMBULATORY_CARE_PROVIDER_SITE_OTHER): Payer: Medicare Other

## 2011-06-09 DIAGNOSIS — N183 Chronic kidney disease, stage 3 unspecified: Secondary | ICD-10-CM

## 2011-06-09 DIAGNOSIS — N189 Chronic kidney disease, unspecified: Secondary | ICD-10-CM

## 2011-06-09 DIAGNOSIS — I701 Atherosclerosis of renal artery: Secondary | ICD-10-CM

## 2011-08-02 ENCOUNTER — Other Ambulatory Visit: Payer: Medicare Other | Admitting: Internal Medicine

## 2011-08-02 DIAGNOSIS — E785 Hyperlipidemia, unspecified: Secondary | ICD-10-CM

## 2011-08-02 DIAGNOSIS — R69 Illness, unspecified: Secondary | ICD-10-CM

## 2011-08-02 DIAGNOSIS — I10 Essential (primary) hypertension: Secondary | ICD-10-CM

## 2011-08-02 DIAGNOSIS — N183 Chronic kidney disease, stage 3 unspecified: Secondary | ICD-10-CM

## 2011-08-02 LAB — LIPID PANEL
Cholesterol: 160 mg/dL (ref 0–200)
HDL: 50 mg/dL (ref >39)
LDL Cholesterol: 87 mg/dL (ref 0–99)
Triglycerides: 115 mg/dL (ref <150)

## 2011-08-02 LAB — HEPATIC FUNCTION PANEL
ALT: 21 U/L (ref 0–35)
AST: 29 U/L (ref 0–37)
Albumin: 4.4 g/dL (ref 3.5–5.2)
Alkaline Phosphatase: 67 U/L (ref 39–117)
Total Protein: 6.7 g/dL (ref 6.0–8.3)

## 2011-08-04 ENCOUNTER — Ambulatory Visit (INDEPENDENT_AMBULATORY_CARE_PROVIDER_SITE_OTHER): Payer: Medicare Other | Admitting: Internal Medicine

## 2011-08-04 ENCOUNTER — Encounter: Payer: Self-pay | Admitting: Internal Medicine

## 2011-08-04 VITALS — BP 106/68 | HR 80 | Temp 97.7°F | Wt 144.0 lb

## 2011-08-04 DIAGNOSIS — N183 Chronic kidney disease, stage 3 unspecified: Secondary | ICD-10-CM

## 2011-08-04 DIAGNOSIS — R7309 Other abnormal glucose: Secondary | ICD-10-CM

## 2011-08-04 DIAGNOSIS — M4800 Spinal stenosis, site unspecified: Secondary | ICD-10-CM

## 2011-08-04 DIAGNOSIS — Z8679 Personal history of other diseases of the circulatory system: Secondary | ICD-10-CM

## 2011-08-04 DIAGNOSIS — J309 Allergic rhinitis, unspecified: Secondary | ICD-10-CM

## 2011-08-04 DIAGNOSIS — E785 Hyperlipidemia, unspecified: Secondary | ICD-10-CM

## 2011-08-04 DIAGNOSIS — I10 Essential (primary) hypertension: Secondary | ICD-10-CM

## 2011-08-04 DIAGNOSIS — R7302 Impaired glucose tolerance (oral): Secondary | ICD-10-CM

## 2011-08-04 DIAGNOSIS — Z87448 Personal history of other diseases of urinary system: Secondary | ICD-10-CM

## 2011-08-04 DIAGNOSIS — K219 Gastro-esophageal reflux disease without esophagitis: Secondary | ICD-10-CM

## 2011-08-14 ENCOUNTER — Other Ambulatory Visit: Payer: Self-pay | Admitting: Internal Medicine

## 2011-08-15 ENCOUNTER — Other Ambulatory Visit: Payer: Self-pay

## 2011-08-15 MED ORDER — ATORVASTATIN CALCIUM 20 MG PO TABS: 20.0000 mg | ORAL_TABLET | Freq: Every day | ORAL | Status: DC

## 2011-08-16 ENCOUNTER — Other Ambulatory Visit: Payer: Self-pay | Admitting: Internal Medicine

## 2011-09-08 ENCOUNTER — Other Ambulatory Visit: Payer: Self-pay

## 2011-09-08 MED ORDER — TRIAMTERENE-HCTZ 75-50 MG PO TABS: 1.0000 | ORAL_TABLET | Freq: Every day | ORAL | Status: DC

## 2011-09-08 MED ORDER — ATORVASTATIN CALCIUM 20 MG PO TABS: 20.0000 mg | ORAL_TABLET | Freq: Every day | ORAL | Status: DC

## 2011-09-08 MED ORDER — FLUTICASONE PROPIONATE 50 MCG/ACT NA SUSP: 2.0000 | Freq: Every day | NASAL | Status: DC

## 2011-09-08 MED ORDER — BENAZEPRIL HCL 10 MG PO TABS: 10.0000 mg | ORAL_TABLET | Freq: Every day | ORAL | Status: DC

## 2011-09-08 MED ORDER — CLOPIDOGREL BISULFATE 75 MG PO TABS: 75.0000 mg | ORAL_TABLET | Freq: Every day | ORAL | Status: DC

## 2011-09-20 DIAGNOSIS — R7302 Impaired glucose tolerance (oral): Secondary | ICD-10-CM | POA: Insufficient documentation

## 2011-09-20 DIAGNOSIS — M4800 Spinal stenosis, site unspecified: Secondary | ICD-10-CM | POA: Insufficient documentation

## 2011-09-20 DIAGNOSIS — J309 Allergic rhinitis, unspecified: Secondary | ICD-10-CM | POA: Insufficient documentation

## 2011-09-20 DIAGNOSIS — Z8679 Personal history of other diseases of the circulatory system: Secondary | ICD-10-CM | POA: Insufficient documentation

## 2011-09-20 NOTE — Progress Notes (Signed)
  Subjective:    Patient ID: Charlotte Duarte, female    DOB: 1941/02/20, 71 y.o.   MRN: [id number]  HPI 71 year old white female with history of hypertension, hyperlipidemia, chronic kidney disease stage III, GE reflux in today for six-month recheck appointment. Blood pressure is under excellent control on Lotensin and Maxzide 75/50. She takes generic Lipitor for hyperlipidemia. Is on Plavix and baby aspirin. History of atrophic vaginitis treated with Estrace. History of allergic rhinitis treated with generic Allegra and generic Flonase nasal spray. History of GE reflux treated with Prilosec.  History of renal artery stenosis. Had left renal artery PTCA and stenting March 2005 with PCI of an in stent stenosis 05/04/2005. She is to be on Plavix for life.  She has not had any problems with fluid retention, no problems with potassium balance, no proteinuria. Dr. Eliott Nine has suggested she have another duplex study in 2014.  History of lumbar spinal stenosis with mass effect on L5 nerve root diagnosed July 2012 with grade 1 spondylolisthesis at L4-L5 do to facet arthrosis. She will need to be off Plavix for 5 days if she needs an epidural steroid injection.  Nonsmoker, 2 glasses of wine daily.    Review of Systems     Objective:   Physical Exam skin is warm and dry; nodes none. Neck is supple without JVD thyromegaly or carotid bruits; chest clear to auscultation; cardiac exam regular rate and rhythm normal S1 and S2 extremities without edema        Assessment & Plan:  Lab work reviewed: Hemoglobin A1c 5.7%. Patient needs to watch diet. TSH normal. Lipid panel liver functions are within normal limits.  History of chronic kidney disease stage III in the setting of renal artery stenosis status post PCI and stenting followed by Dr. Eliott Nine  Spinal stenosis with L5 radiculopathy  Hypertension  Hyperlipidemia  GE reflux  Allergic rhinitis  Plan: Continue same medications and return in 6 months  for physical examination. Needs to be off Plavix for 5 days if she decides on epidural steroid injection for spinal stenosis with radiculopathy

## 2011-09-20 NOTE — Patient Instructions (Addendum)
Continue same medications. Watch diet because of prediabetes. Return in 6 months for physical exam. He will need to be off Plavix for 5 days should he decide on an epidural steroid injection for spinal stenosis with radiculopathy.

## 2011-09-23 ENCOUNTER — Telehealth: Payer: Self-pay

## 2011-09-23 NOTE — Telephone Encounter (Signed)
Left message for patient to the office to schedule lab work/ C Met needed.

## 2011-10-26 ENCOUNTER — Other Ambulatory Visit (HOSPITAL_COMMUNITY): Payer: Self-pay | Admitting: Orthopaedic Surgery

## 2011-11-01 ENCOUNTER — Encounter (HOSPITAL_COMMUNITY): Payer: Self-pay | Admitting: Pharmacy Technician

## 2011-11-04 ENCOUNTER — Ambulatory Visit (HOSPITAL_COMMUNITY)
Admission: RE | Admit: 2011-11-04 | Discharge: 2011-11-04 | Disposition: A | Discharge status: Home / Self Care | Payer: Medicare Other | Source: Ambulatory Visit | Attending: Orthopaedic Surgery | Admitting: Orthopaedic Surgery

## 2011-11-04 ENCOUNTER — Encounter (HOSPITAL_COMMUNITY): Payer: Self-pay

## 2011-11-04 ENCOUNTER — Telehealth: Payer: Self-pay

## 2011-11-04 ENCOUNTER — Encounter (HOSPITAL_COMMUNITY)
Admission: RE | Admit: 2011-11-04 | Discharge: 2011-11-04 | Disposition: A | Discharge status: Home / Self Care | Payer: Medicare Other | Source: Ambulatory Visit | Attending: Orthopaedic Surgery | Admitting: Orthopaedic Surgery

## 2011-11-04 DIAGNOSIS — M169 Osteoarthritis of hip, unspecified: Secondary | ICD-10-CM | POA: Insufficient documentation

## 2011-11-04 DIAGNOSIS — M1610 Unilateral primary osteoarthritis, unspecified hip: Secondary | ICD-10-CM | POA: Insufficient documentation

## 2011-11-04 DIAGNOSIS — M47814 Spondylosis without myelopathy or radiculopathy, thoracic region: Secondary | ICD-10-CM | POA: Insufficient documentation

## 2011-11-04 DIAGNOSIS — Z01812 Encounter for preprocedural laboratory examination: Secondary | ICD-10-CM | POA: Insufficient documentation

## 2011-11-04 HISTORY — DX: Unspecified osteoarthritis, unspecified site: M19.90

## 2011-11-04 HISTORY — DX: Gastro-esophageal reflux disease without esophagitis: K21.9

## 2011-11-04 HISTORY — DX: Personal history of other diseases of the digestive system: Z87.19

## 2011-11-04 HISTORY — DX: Inflammatory liver disease, unspecified: K75.9

## 2011-11-04 LAB — URINALYSIS, ROUTINE W REFLEX MICROSCOPIC
Bilirubin Urine: NEGATIVE
Hgb urine dipstick: NEGATIVE
Ketones, ur: NEGATIVE mg/dL
Protein, ur: NEGATIVE mg/dL
Urobilinogen, UA: 0.2 mg/dL (ref 0.0–1.0)

## 2011-11-04 LAB — CBC
MCH: 29.6 pg (ref 26.0–34.0)
MCHC: 34.3 g/dL (ref 30.0–36.0)
Platelets: 320 10*3/uL (ref 150–400)
RBC: 4.59 MIL/uL (ref 3.87–5.11)

## 2011-11-04 LAB — APTT: aPTT: 30 seconds (ref 24–37)

## 2011-11-04 LAB — BASIC METABOLIC PANEL
BUN: 39 mg/dL — ABNORMAL HIGH (ref 6–23)
Calcium: 10.2 mg/dL (ref 8.4–10.5)
GFR calc non Af Amer: 36 mL/min — ABNORMAL LOW (ref >90)
Glucose, Bld: 88 mg/dL (ref 70–99)
Sodium: 137 mEq/L (ref 135–145)

## 2011-11-04 NOTE — Patient Instructions (Addendum)
20 Hibo Blasdell  11/04/2011   Your procedure is scheduled on:  11-11-2011  Report to [facility] at 1000  AM.  Call this number if you have problems the morning of surgery: [phone]   Remember:   Do not eat food or drink liquids:After Midnight.  .  Take these medicines the morning of surgery with A SIP OF WATER: flonase nasal spray if needed, lipitor, allergra, prilosec   Do not wear jewelry or make up.  Do not wear lotions, powders, or perfumes.Do not wear deodorant.    Do not bring valuables to the hospital.  Contacts, dentures or bridgework may not be worn into surgery.  Leave suitcase in the car. After surgery it may be brought to your room.  For patients admitted to the hospital, checkout time is 11:00 AM the day of discharge                             Patients discharged the day of surgery will not be allowed to drive home. If going home same day of surgery, you must have someone stay with you the first 24 hours at home and arrange for some one to drive you home from hospital.    Special Instructions: CHG Shower Use Special Wash: 1/2 bottle night before surgery and 1/2 bottle morning  of surgery, use regular soap on face and front and back private area. Women do not shave legs or underarms for 2 days before showers. Men may shave face morning of surgery.    Please read over the following fact sheets that you were given: MRSA Information, blood  fact sheet  Cain Sieve [facility] pre op nurse phone number [phone], call if needed

## 2011-11-04 NOTE — Telephone Encounter (Signed)
Spoke with patient today re: lab work. Has not had a Bmet since 04/2011 at Dr. Elza Rafter office. She is having hip replacement surgery in a few weeks, and will request that it be done with her pre-op labs.

## 2011-11-04 NOTE — Pre-Procedure Instructions (Signed)
bmet results faxed to dr c blackman, fax confirmation received and placed on pt chart

## 2011-11-11 ENCOUNTER — Encounter (HOSPITAL_COMMUNITY): Payer: Self-pay | Admitting: Anesthesiology

## 2011-11-11 ENCOUNTER — Inpatient Hospital Stay (HOSPITAL_COMMUNITY): Payer: Medicare Other

## 2011-11-11 ENCOUNTER — Inpatient Hospital Stay (HOSPITAL_COMMUNITY)
Admission: RE | Admit: 2011-11-11 | Discharge: 2011-11-14 | DRG: 470 | Disposition: A | Discharge status: Home Health | Payer: Medicare Other | Source: Ambulatory Visit | Attending: Orthopaedic Surgery | Admitting: Orthopaedic Surgery

## 2011-11-11 ENCOUNTER — Encounter (HOSPITAL_COMMUNITY)
Admission: RE | Disposition: A | Discharge status: Home Health | Payer: Self-pay | Source: Ambulatory Visit | Attending: Orthopaedic Surgery

## 2011-11-11 ENCOUNTER — Inpatient Hospital Stay (HOSPITAL_COMMUNITY): Payer: Medicare Other | Admitting: Anesthesiology

## 2011-11-11 ENCOUNTER — Encounter (HOSPITAL_COMMUNITY): Payer: Self-pay | Admitting: *Deleted

## 2011-11-11 DIAGNOSIS — I10 Essential (primary) hypertension: Secondary | ICD-10-CM | POA: Diagnosis present

## 2011-11-11 DIAGNOSIS — E785 Hyperlipidemia, unspecified: Secondary | ICD-10-CM | POA: Diagnosis present

## 2011-11-11 DIAGNOSIS — N393 Stress incontinence (female) (male): Secondary | ICD-10-CM | POA: Diagnosis present

## 2011-11-11 DIAGNOSIS — K449 Diaphragmatic hernia without obstruction or gangrene: Secondary | ICD-10-CM | POA: Diagnosis present

## 2011-11-11 DIAGNOSIS — Z7902 Long term (current) use of antithrombotics/antiplatelets: Secondary | ICD-10-CM

## 2011-11-11 DIAGNOSIS — K219 Gastro-esophageal reflux disease without esophagitis: Secondary | ICD-10-CM | POA: Diagnosis present

## 2011-11-11 DIAGNOSIS — Z87891 Personal history of nicotine dependence: Secondary | ICD-10-CM

## 2011-11-11 DIAGNOSIS — M1610 Unilateral primary osteoarthritis, unspecified hip: Principal | ICD-10-CM | POA: Diagnosis present

## 2011-11-11 DIAGNOSIS — Z79899 Other long term (current) drug therapy: Secondary | ICD-10-CM

## 2011-11-11 DIAGNOSIS — Z7982 Long term (current) use of aspirin: Secondary | ICD-10-CM

## 2011-11-11 DIAGNOSIS — M169 Osteoarthritis of hip, unspecified: Secondary | ICD-10-CM

## 2011-11-11 DIAGNOSIS — Z8619 Personal history of other infectious and parasitic diseases: Secondary | ICD-10-CM

## 2011-11-11 DIAGNOSIS — N289 Disorder of kidney and ureter, unspecified: Secondary | ICD-10-CM | POA: Diagnosis present

## 2011-11-11 HISTORY — DX: Osteoarthritis of hip, unspecified: M16.9

## 2011-11-11 HISTORY — PX: TOTAL HIP ARTHROPLASTY: SHX124

## 2011-11-11 LAB — ABO/RH: ABO/RH(D): AB POS

## 2011-11-11 LAB — TYPE AND SCREEN

## 2011-11-11 SURGERY — ARTHROPLASTY, HIP, TOTAL, ANTERIOR APPROACH
Anesthesia: Spinal | Site: Hip | Laterality: Right | Wound class: Clean

## 2011-11-11 MED ORDER — METHOCARBAMOL 500 MG PO TABS
500.0000 mg | ORAL_TABLET | Freq: Four times a day (QID) | ORAL | Status: DC | PRN
  Administered 2011-11-12 – 2011-11-14 (×4): 500 mg via ORAL
  Filled 2011-11-11 (×4): qty 1

## 2011-11-11 MED ORDER — HYDROMORPHONE HCL PF 1 MG/ML IJ SOLN: 0.2500 mg | INTRAMUSCULAR | Status: DC | PRN

## 2011-11-11 MED ORDER — PHENOL 1.4 % MT LIQD: 1.0000 | OROMUCOSAL | Status: DC | PRN

## 2011-11-11 MED ORDER — PROMETHAZINE HCL 25 MG/ML IJ SOLN: 6.2500 mg | INTRAMUSCULAR | Status: DC | PRN

## 2011-11-11 MED ORDER — CEFAZOLIN SODIUM-DEXTROSE 2-3 GM-% IV SOLR
2.0000 g | INTRAVENOUS | Status: AC
  Administered 2011-11-11: 2 g via INTRAVENOUS

## 2011-11-11 MED ORDER — PROPOFOL INFUSION 10 MG/ML OPTIME
INTRAVENOUS | Status: DC | PRN
  Administered 2011-11-11: 50 ug/kg/min via INTRAVENOUS

## 2011-11-11 MED ORDER — CLOPIDOGREL BISULFATE 75 MG PO TABS
75.0000 mg | ORAL_TABLET | Freq: Every day | ORAL | Status: DC
  Administered 2011-11-12 – 2011-11-14 (×3): 75 mg via ORAL
  Filled 2011-11-11 (×4): qty 1

## 2011-11-11 MED ORDER — KETOROLAC TROMETHAMINE 15 MG/ML IJ SOLN
7.5000 mg | Freq: Four times a day (QID) | INTRAMUSCULAR | Status: AC
  Administered 2011-11-11 – 2011-11-12 (×4): 7.5 mg via INTRAVENOUS
  Filled 2011-11-11 (×5): qty 1

## 2011-11-11 MED ORDER — DEXTROSE 5 % IV SOLN
500.0000 mg | Freq: Four times a day (QID) | INTRAVENOUS | Status: DC | PRN
  Administered 2011-11-11: 500 mg via INTRAVENOUS
  Filled 2011-11-11 (×2): qty 5

## 2011-11-11 MED ORDER — ONDANSETRON HCL 4 MG/2ML IJ SOLN: 4.0000 mg | Freq: Four times a day (QID) | INTRAMUSCULAR | Status: DC | PRN

## 2011-11-11 MED ORDER — METOCLOPRAMIDE HCL 5 MG/ML IJ SOLN: 5.0000 mg | Freq: Three times a day (TID) | INTRAMUSCULAR | Status: DC | PRN

## 2011-11-11 MED ORDER — 0.9 % SODIUM CHLORIDE (POUR BTL) OPTIME
TOPICAL | Status: DC | PRN
  Administered 2011-11-11: 1000 mL

## 2011-11-11 MED ORDER — TRIAMTERENE-HCTZ 75-50 MG PO TABS
1.0000 | ORAL_TABLET | Freq: Every day | ORAL | Status: DC
  Filled 2011-11-11 (×4): qty 1

## 2011-11-11 MED ORDER — ATORVASTATIN CALCIUM 20 MG PO TABS
20.0000 mg | ORAL_TABLET | Freq: Every day | ORAL | Status: DC
  Administered 2011-11-12: 20 mg via ORAL
  Filled 2011-11-11 (×2): qty 1

## 2011-11-11 MED ORDER — MIDAZOLAM HCL 5 MG/5ML IJ SOLN
INTRAMUSCULAR | Status: DC | PRN
  Administered 2011-11-11: 2 mg via INTRAVENOUS

## 2011-11-11 MED ORDER — MORPHINE SULFATE 2 MG/ML IJ SOLN
2.0000 mg | INTRAMUSCULAR | Status: DC | PRN
  Administered 2011-11-11: 1 mg via INTRAVENOUS
  Administered 2011-11-11 (×2): 2 mg via INTRAVENOUS
  Filled 2011-11-11 (×3): qty 1

## 2011-11-11 MED ORDER — ZOLPIDEM TARTRATE 5 MG PO TABS: 5.0000 mg | ORAL_TABLET | Freq: Every evening | ORAL | Status: DC | PRN

## 2011-11-11 MED ORDER — ACETAMINOPHEN 650 MG RE SUPP: 650.0000 mg | Freq: Four times a day (QID) | RECTAL | Status: DC | PRN

## 2011-11-11 MED ORDER — FLUTICASONE PROPIONATE 50 MCG/ACT NA SUSP
2.0000 | NASAL | Status: DC | PRN
  Filled 2011-11-11: qty 16

## 2011-11-11 MED ORDER — ONDANSETRON HCL 4 MG PO TABS: 4.0000 mg | ORAL_TABLET | Freq: Four times a day (QID) | ORAL | Status: DC | PRN

## 2011-11-11 MED ORDER — ALUM & MAG HYDROXIDE-SIMETH 200-200-20 MG/5ML PO SUSP: 30.0000 mL | ORAL | Status: DC | PRN

## 2011-11-11 MED ORDER — ONDANSETRON HCL 4 MG/2ML IJ SOLN
INTRAMUSCULAR | Status: DC | PRN
  Administered 2011-11-11: 4 mg via INTRAVENOUS

## 2011-11-11 MED ORDER — ASPIRIN EC 325 MG PO TBEC
325.0000 mg | DELAYED_RELEASE_TABLET | Freq: Every day | ORAL | Status: DC
  Administered 2011-11-12 – 2011-11-14 (×3): 325 mg via ORAL
  Filled 2011-11-11 (×4): qty 1

## 2011-11-11 MED ORDER — METOCLOPRAMIDE HCL 10 MG PO TABS
5.0000 mg | ORAL_TABLET | Freq: Three times a day (TID) | ORAL | Status: DC | PRN
  Administered 2011-11-12: 10 mg via ORAL
  Filled 2011-11-11: qty 1

## 2011-11-11 MED ORDER — BUPIVACAINE HCL (PF) 0.5 % IJ SOLN
INTRAMUSCULAR | Status: AC
  Filled 2011-11-11: qty 30

## 2011-11-11 MED ORDER — CEFAZOLIN SODIUM 1-5 GM-% IV SOLN
1.0000 g | Freq: Four times a day (QID) | INTRAVENOUS | Status: AC
  Administered 2011-11-11 – 2011-11-12 (×2): 1 g via INTRAVENOUS
  Filled 2011-11-11 (×2): qty 50

## 2011-11-11 MED ORDER — ACETAMINOPHEN 325 MG PO TABS: 650.0000 mg | ORAL_TABLET | Freq: Four times a day (QID) | ORAL | Status: DC | PRN

## 2011-11-11 MED ORDER — EPHEDRINE SULFATE 50 MG/ML IJ SOLN
INTRAMUSCULAR | Status: DC | PRN
  Administered 2011-11-11 (×2): 5 mg via INTRAVENOUS
  Administered 2011-11-11: 10 mg via INTRAVENOUS
  Administered 2011-11-11 (×3): 5 mg via INTRAVENOUS

## 2011-11-11 MED ORDER — LACTATED RINGERS IV SOLN
INTRAVENOUS | Status: DC
Start: 2011-11-11 — End: 2011-11-11
  Administered 2011-11-11: 1000 mL via INTRAVENOUS
  Administered 2011-11-11: 14:00:00 via INTRAVENOUS

## 2011-11-11 MED ORDER — BENAZEPRIL HCL 10 MG PO TABS
10.0000 mg | ORAL_TABLET | Freq: Every day | ORAL | Status: DC
Start: 2011-11-12 — End: 2011-11-14
  Filled 2011-11-11 (×4): qty 1

## 2011-11-11 MED ORDER — MENTHOL 3 MG MT LOZG: 1.0000 | LOZENGE | OROMUCOSAL | Status: DC | PRN

## 2011-11-11 MED ORDER — LIDOCAINE HCL (CARDIAC) 20 MG/ML IV SOLN
INTRAVENOUS | Status: DC | PRN
  Administered 2011-11-11: 60 mg via INTRAVENOUS

## 2011-11-11 MED ORDER — BUPIVACAINE HCL (PF) 0.5 % IJ SOLN
INTRAMUSCULAR | Status: DC | PRN
  Administered 2011-11-11: 3 mL

## 2011-11-11 MED ORDER — DOCUSATE SODIUM 100 MG PO CAPS
100.0000 mg | ORAL_CAPSULE | Freq: Two times a day (BID) | ORAL | Status: DC
  Administered 2011-11-11 – 2011-11-14 (×6): 100 mg via ORAL

## 2011-11-11 MED ORDER — FERROUS SULFATE 325 (65 FE) MG PO TABS
325.0000 mg | ORAL_TABLET | Freq: Three times a day (TID) | ORAL | Status: DC
  Administered 2011-11-12 – 2011-11-14 (×7): 325 mg via ORAL
  Filled 2011-11-11 (×11): qty 1

## 2011-11-11 MED ORDER — SODIUM CHLORIDE 0.9 % IV SOLN
INTRAVENOUS | Status: DC
  Administered 2011-11-11 – 2011-11-13 (×3): via INTRAVENOUS

## 2011-11-11 MED ORDER — LORATADINE 10 MG PO TABS
10.0000 mg | ORAL_TABLET | Freq: Every day | ORAL | Status: DC
  Administered 2011-11-12 – 2011-11-14 (×3): 10 mg via ORAL
  Filled 2011-11-11 (×3): qty 1

## 2011-11-11 MED ORDER — PANTOPRAZOLE SODIUM 40 MG PO TBEC
40.0000 mg | DELAYED_RELEASE_TABLET | Freq: Every day | ORAL | Status: DC
  Administered 2011-11-12 – 2011-11-13 (×2): 40 mg via ORAL
  Filled 2011-11-11 (×3): qty 1

## 2011-11-11 MED ORDER — DIPHENHYDRAMINE HCL 12.5 MG/5ML PO ELIX: 12.5000 mg | ORAL_SOLUTION | ORAL | Status: DC | PRN

## 2011-11-11 MED ORDER — LACTATED RINGERS IV SOLN: INTRAVENOUS | Status: DC

## 2011-11-11 MED ORDER — ACETAMINOPHEN 10 MG/ML IV SOLN
INTRAVENOUS | Status: DC | PRN
  Administered 2011-11-11: 1000 mg via INTRAVENOUS

## 2011-11-11 MED ORDER — FENTANYL CITRATE 0.05 MG/ML IJ SOLN
INTRAMUSCULAR | Status: DC | PRN
  Administered 2011-11-11 (×2): 50 ug via INTRAVENOUS

## 2011-11-11 MED ORDER — MEPERIDINE HCL 50 MG/ML IJ SOLN: 6.2500 mg | INTRAMUSCULAR | Status: DC | PRN

## 2011-11-11 MED ORDER — OXYCODONE HCL 5 MG PO TABS
5.0000 mg | ORAL_TABLET | ORAL | Status: DC | PRN
  Administered 2011-11-11: 10 mg via ORAL
  Administered 2011-11-11: 5 mg via ORAL
  Administered 2011-11-12: 10 mg via ORAL
  Administered 2011-11-12: 5 mg via ORAL
  Administered 2011-11-12: 10 mg via ORAL
  Administered 2011-11-13 – 2011-11-14 (×2): 5 mg via ORAL
  Filled 2011-11-11: qty 1
  Filled 2011-11-11 (×2): qty 2
  Filled 2011-11-11: qty 1
  Filled 2011-11-11 (×2): qty 2
  Filled 2011-11-11: qty 1

## 2011-11-11 SURGICAL SUPPLY — 32 items
BAG ZIPLOCK 12X15 (MISCELLANEOUS) ×4 IMPLANT
BLADE SAW SGTL 18X1.27X75 (BLADE) ×2 IMPLANT
CLOTH BEACON ORANGE TIMEOUT ST (SAFETY) ×2 IMPLANT
DRAPE C-ARM 42X72 X-RAY (DRAPES) ×2 IMPLANT
DRAPE STERI IOBAN 125X83 (DRAPES) ×2 IMPLANT
DRAPE U-SHAPE 47X51 STRL (DRAPES) ×6 IMPLANT
DRSG MEPILEX BORDER 4X8 (GAUZE/BANDAGES/DRESSINGS) ×2 IMPLANT
DURAPREP 26ML APPLICATOR (WOUND CARE) ×2 IMPLANT
ELECT BLADE TIP CTD 4 INCH (ELECTRODE) ×2 IMPLANT
ELECT REM PT RETURN 9FT ADLT (ELECTROSURGICAL) ×2
ELECTRODE REM PT RTRN 9FT ADLT (ELECTROSURGICAL) ×1 IMPLANT
FACESHIELD LNG OPTICON STERILE (SAFETY) ×8 IMPLANT
GAUZE XEROFORM 1X8 LF (GAUZE/BANDAGES/DRESSINGS) ×2 IMPLANT
GLOVE BIO SURGEON STRL SZ7 (GLOVE) ×2 IMPLANT
GLOVE BIO SURGEON STRL SZ7.5 (GLOVE) ×2 IMPLANT
GLOVE BIOGEL PI IND STRL 7.5 (GLOVE) IMPLANT
GLOVE BIOGEL PI IND STRL 8 (GLOVE) ×1 IMPLANT
GLOVE BIOGEL PI INDICATOR 7.5 (GLOVE)
GLOVE BIOGEL PI INDICATOR 8 (GLOVE) ×1
GLOVE ECLIPSE 7.0 STRL STRAW (GLOVE) ×2 IMPLANT
GOWN STRL REIN XL XLG (GOWN DISPOSABLE) ×4 IMPLANT
KIT BASIN OR (CUSTOM PROCEDURE TRAY) ×2 IMPLANT
PACK TOTAL JOINT (CUSTOM PROCEDURE TRAY) ×2 IMPLANT
PADDING CAST COTTON 6X4 STRL (CAST SUPPLIES) ×2 IMPLANT
STAPLER VISISTAT 35W (STAPLE) IMPLANT
SUT ETHIBOND NAB CT1 #1 30IN (SUTURE) ×4 IMPLANT
SUT VIC AB 1 CT1 36 (SUTURE) ×4 IMPLANT
SUT VIC AB 2-0 CT1 27 (SUTURE) ×2
SUT VIC AB 2-0 CT1 TAPERPNT 27 (SUTURE) ×2 IMPLANT
TOWEL OR 17X26 10 PK STRL BLUE (TOWEL DISPOSABLE) ×4 IMPLANT
TOWEL OR NON WOVEN STRL DISP B (DISPOSABLE) ×2 IMPLANT
TRAY FOLEY CATH 14FRSI W/METER (CATHETERS) ×2 IMPLANT

## 2011-11-11 NOTE — Anesthesia Postprocedure Evaluation (Signed)
  Anesthesia Post-op Note  Patient: Charlotte Duarte  Procedure(s) Performed: Procedure(s) (LRB): TOTAL HIP ARTHROPLASTY ANTERIOR APPROACH (Right)  Patient Location: PACU  Anesthesia Type: Spinal  Level of Consciousness: awake and alert   Airway and Oxygen Therapy: Patient Spontanous Breathing  Post-op Pain: mild  Post-op Assessment: Post-op Vital signs reviewed, Patient's Cardiovascular Status Stable, Respiratory Function Stable, Patent Airway and No signs of Nausea or vomiting  Post-op Vital Signs: stable  Complications: No apparent anesthesia complications

## 2011-11-11 NOTE — Anesthesia Procedure Notes (Signed)
Spinal  Patient location during procedure: OR Staffing Anesthesiologist: Dacian Orrico Performed by: anesthesiologist  Preanesthetic Checklist Completed: patient identified, site marked, surgical consent, pre-op evaluation, timeout performed, IV checked, risks and benefits discussed and monitors and equipment checked Spinal Block Patient position: sitting Prep: Betadine Patient monitoring: heart rate, continuous pulse ox and blood pressure Approach: right paramedian Location: L3-4 Injection technique: single-shot Needle Needle type: Spinocan  Needle gauge: 22 G Needle length: 9 cm Additional Notes Expiration date of kit checked and confirmed. Patient tolerated procedure well, without complications.     

## 2011-11-11 NOTE — Preoperative (Signed)
Beta Blockers   Reason not to administer Beta Blockers:Not Applicable 

## 2011-11-11 NOTE — Plan of Care (Signed)
Problem: Consults Goal: Diagnosis- Total Joint Replacement Outcome: Completed/Met Date Met:  11/11/11 Right anterior hip

## 2011-11-11 NOTE — Transfer of Care (Signed)
Immediate Anesthesia Transfer of Care Note  Patient: Charlotte Duarte  Procedure(s) Performed: Procedure(s) (LRB) with comments: TOTAL HIP ARTHROPLASTY ANTERIOR APPROACH (Right) - Right Total Hip Arthroplasty  Patient Location: PACU  Anesthesia Type: MAC and Spinal  Level of Consciousness: awake, alert , oriented and patient cooperative  Airway & Oxygen Therapy: Patient Spontanous Breathing and Patient connected to face mask oxygen  Post-op Assessment: Report given to PACU RN and Post -op Vital signs reviewed and stable  Post vital signs: Reviewed and stable  Complications: No apparent anesthesia complications

## 2011-11-11 NOTE — Anesthesia Preprocedure Evaluation (Addendum)
Anesthesia Evaluation  Patient identified by MRN, date of birth, ID band Patient awake    Reviewed: Allergy & Precautions, H&P , NPO status , Patient's Chart, lab work & pertinent test results  Airway Mallampati: II TM Distance: >3 FB Neck ROM: Full    Dental No notable dental hx.    Pulmonary neg pulmonary ROS,  breath sounds clear to auscultation  Pulmonary exam normal       Cardiovascular hypertension, Pt. on medications negative cardio ROS  Rhythm:Regular Rate:Normal     Neuro/Psych negative neurological ROS  negative psych ROS   GI/Hepatic negative GI ROS, Neg liver ROS, hiatal hernia, GERD-  Controlled,(+) Hepatitis -, B  Endo/Other  negative endocrine ROS  Renal/GU Renal InsufficiencyRenal diseasenegative Renal ROS  negative genitourinary   Musculoskeletal negative musculoskeletal ROS (+)   Abdominal   Peds negative pediatric ROS (+)  Hematology negative hematology ROS (+)   Anesthesia Other Findings Multiple crowns  Reproductive/Obstetrics negative OB ROS                           Anesthesia Physical Anesthesia Plan  ASA: II  Anesthesia Plan: Spinal   Post-op Pain Management:    Induction:   Airway Management Planned: Simple Face Mask  Additional Equipment:   Intra-op Plan:   Post-operative Plan:   Informed Consent: I have reviewed the patients History and Physical, chart, labs and discussed the procedure including the risks, benefits and alternatives for the proposed anesthesia with the patient or authorized representative who has indicated his/her understanding and acceptance.   Dental advisory given  Plan Discussed with: CRNA  Anesthesia Plan Comments:         Anesthesia Quick Evaluation

## 2011-11-11 NOTE — Brief Op Note (Signed)
11/11/2011  2:55 PM  PATIENT:  Charlotte Duarte  71 y.o. female  PRE-OPERATIVE DIAGNOSIS:  Severe osteoarthritis right hip  POST-OPERATIVE DIAGNOSIS:  Severe osteoarthritis right hip  PROCEDURE:  Procedure(s) (LRB) with comments: TOTAL HIP ARTHROPLASTY ANTERIOR APPROACH (Right) - Right Total Hip Arthroplasty  SURGEON:  Surgeon(s) and Role:    * Kathryne Hitch, MD - Primary  PHYSICIAN ASSISTANT:   ASSISTANTS: none   ANESTHESIA:   spinal  EBL:  Total I/O In: 2300 [I.V.:2300] Out: 775 [Urine:350; Blood:425]  BLOOD ADMINISTERED:none  DRAINS: none   LOCAL MEDICATIONS USED:  NONE  SPECIMEN:  No Specimen  DISPOSITION OF SPECIMEN:  N/A  COUNTS:  YES  TOURNIQUET:  * No tourniquets in log *  DICTATION: .Other Dictation: Dictation Number [id number]  PLAN OF CARE: Admit to inpatient   PATIENT DISPOSITION:  PACU - hemodynamically stable.   Delay start of Pharmacological VTE agent (>24hrs) due to surgical blood loss or risk of bleeding: no

## 2011-11-11 NOTE — H&P (Signed)
Charlotte Duarte is an 71 y.o. female.   Chief Complaint:   Severe right hip pain; known OA HPI:   71 yo female with well-documented bone-on-bone wear of her right hip.  Given the failure of conservative treatment, she wishes to proceed with a right total hip replacement.  The risks are blood loss, nerve injury, fracture, DVT and infection.  The goals are increased mobility/quality of life and decreased pain.  Past Medical History  Diagnosis Date  . Hyperlipidemia   . Hypertension   . Atrophic vaginitis   . SUI (stress urinary incontinence, female)   . GERD (gastroesophageal reflux disease)   . H/O hiatal hernia   . Arthritis   . 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 Renal artery stenosis   . Degenerative arthritis of hip 11/11/2011    Past Surgical History  Procedure Date  . Carpal tunnel release 1995    right  . Knee arthroscopy     BOTH KNEES  . Cataract extraction 20 yrs ago    both eyes   . Renal stint     RENAL STINT LEFT RENAL ARTERY IN 2005.  RENAL STINT REPLACE 03/2005.  . Bilateral capsulectomy 2008  . Augmentation mammaplasty 1980     both breasts implants REMOVED IN 2005    Family History  Problem Relation Age of Onset  . Mental illness Mother   . Breast cancer Mother   . Hypertension Mother   . Heart disease Father   . Stroke Brother    Social History:  reports that she quit smoking about 41 years ago. Her smoking use included Cigarettes. She has a 23 pack-year smoking history. She has never used smokeless tobacco. She reports that she drinks about 4.2 ounces of alcohol per week. She reports that she does not use illicit drugs.  Allergies: No Known Allergies  Medications Prior to Admission  Medication Sig Dispense Refill  . aspirin 81 MG EC tablet Take 81 mg by mouth daily.        Marland Kitchen atorvastatin (LIPITOR) 20 MG tablet Take 20 mg by mouth daily with breakfast.      . benazepril (LOTENSIN) 10 MG tablet Take 10 mg by mouth daily with breakfast.      .  calcium carbonate (TUMS - DOSED IN MG ELEMENTAL CALCIUM) 500 MG chewable tablet Chew 1 tablet by mouth as needed. Prn reflux      . fexofenadine (ALLEGRA) 180 MG tablet Take 180 mg by mouth every morning.       . fluticasone (FLONASE) 50 MCG/ACT nasal spray Place 2 sprays into the nose as needed.      Marland Kitchen ibuprofen (ADVIL,MOTRIN) 200 MG tablet Take 200 mg by mouth every 6 (six) hours as needed. Pain      . omeprazole (PRILOSEC) 20 MG capsule Take 20 mg by mouth as needed. Prn only      . triamterene-hydrochlorothiazide (MAXZIDE) 75-50 MG per tablet Take 1 tablet by mouth daily with breakfast.      . clopidogrel (PLAVIX) 75 MG tablet Take 75 mg by mouth daily with breakfast.        Results for orders placed during the hospital encounter of 11/11/11 (from the past 48 hour(s))  ABO/RH     Status: Normal   Collection Time   11/11/11 10:30 AM      Component Value Range Comment   ABO/RH(D) AB POS     TYPE AND SCREEN  Status: Normal   Collection Time   11/11/11 10:35 AM      Component Value Range Comment   ABO/RH(D) AB POS      Antibody Screen NEG      Sample Expiration 11/14/2011      No results found.  Review of Systems  All other systems reviewed and are negative.    Blood pressure 120/75, pulse 69, temperature 97.2 F (36.2 C), resp. rate 20, SpO2 100.00%. Physical Exam  Constitutional: She is oriented to person, place, and time. She appears well-developed and well-nourished.  HENT:  Head: Normocephalic and atraumatic.  Eyes: EOM are normal. Pupils are equal, round, and reactive to light.  Neck: Normal range of motion. Neck supple.  Cardiovascular: Normal rate and regular rhythm.   Respiratory: Effort normal and breath sounds normal.  GI: Soft. Bowel sounds are normal.  Musculoskeletal:       Right hip: She exhibits decreased range of motion, decreased strength, bony tenderness and crepitus.  Neurological: She is alert and oriented to person, place, and time.  Skin: Skin is  warm and dry.  Psychiatric: She has a normal mood and affect.     Assessment/Plan Severe end-stage arthritis right hip with bone-on-bone wear 1)  To the OR today for a right total hip replacement  Charlotte Duarte Y 11/11/2011, 12:26 PM

## 2011-11-11 NOTE — Progress Notes (Signed)
Utilization review completed.  

## 2011-11-12 LAB — CBC
Hemoglobin: 9.8 g/dL — ABNORMAL LOW (ref 12.0–15.0)
MCH: 30.1 pg (ref 26.0–34.0)
MCHC: 34.8 g/dL (ref 30.0–36.0)
MCV: 86.5 fL (ref 78.0–100.0)
RBC: 3.26 MIL/uL — ABNORMAL LOW (ref 3.87–5.11)

## 2011-11-12 LAB — BASIC METABOLIC PANEL
CO2: 24 mEq/L (ref 19–32)
Calcium: 8.1 mg/dL — ABNORMAL LOW (ref 8.4–10.5)
Creatinine, Ser: 1.44 mg/dL — ABNORMAL HIGH (ref 0.50–1.10)
Glucose, Bld: 118 mg/dL — ABNORMAL HIGH (ref 70–99)

## 2011-11-12 MED ORDER — SODIUM CHLORIDE 0.9 % IV SOLN
Freq: Once | INTRAVENOUS | Status: AC
  Administered 2011-11-12: 500 mL via INTRAVENOUS

## 2011-11-12 MED ORDER — ATORVASTATIN CALCIUM 20 MG PO TABS
20.0000 mg | ORAL_TABLET | Freq: Every day | ORAL | Status: DC
  Administered 2011-11-13: 20 mg via ORAL
  Filled 2011-11-12 (×2): qty 1

## 2011-11-12 NOTE — Evaluation (Signed)
Occupational Therapy Evaluation Patient Details Name: Charlotte Duarte MRN: [id number] DOB: 1940/09/21 Today's Date: 11/12/2011 Time: 4098-1191 OT Time Calculation (min): 22 min  OT Assessment / Plan / Recommendation Clinical Impression  Pt doing very well POD 1 R THR. All education completed. Pt will have necessary level of A from family upon d/c.    OT Assessment  Patient does not need any further OT services    Follow Up Recommendations  No OT follow up    Barriers to Discharge      Equipment Recommendations  None recommended by OT    Recommendations for Other Services    Frequency       Precautions / Restrictions Precautions Precautions: None   Pertinent Vitals/Pain No c/o pain    ADL  Grooming: Performed;Wash/dry hands;Supervision/safety Where Assessed - Grooming: Supported standing Upper Body Bathing: Simulated;Set up Where Assessed - Upper Body Bathing: Unsupported sitting Lower Body Bathing: Simulated;Minimal assistance Where Assessed - Lower Body Bathing: Supported sit to stand Upper Body Dressing: Simulated;Set up Where Assessed - Upper Body Dressing: Unsupported sitting Lower Body Dressing: Performed;Set up Where Assessed - Lower Body Dressing: Supported sit to stand Toilet Transfer: Performed;Min Pension scheme manager Method: Sit to Barista: Regular height toilet;Grab bars Toileting - Architect and Hygiene: Performed;Supervision/safety Where Assessed - Engineer, mining and Hygiene: Sit to stand from 3-in-1 or toilet Transfers/Ambulation Related to ADLs: Pt ambulated to the bathroom with supervision. Educated pt how to safely step into tub and how to don/doff pants with good return demo.    OT Diagnosis:    OT Problem List:   OT Treatment Interventions:     OT Goals    Visit Information  Last OT Received On: 11/12/11 Assistance Needed: +1    Subjective Data  Subjective: Its amazing. Im not having any  pain. Patient Stated Goal: I want to hike , garden, walk the dog & ride my motorcycle.   Prior Functioning  Vision/Perception  Home Living Lives With: Spouse Available Help at Discharge: Friend(s);Family Type of Home: House Home Access: Stairs to enter Secretary/administrator of Steps: 3 Entrance Stairs-Rails: None Home Layout: One level Bathroom Shower/Tub: Network engineer:  (will borrow toilet riser from friend) Prior Function Level of Independence: Independent Able to Take Stairs?: Yes Driving: Yes Vocation: Retired Musician: No difficulties Dominant Hand: Right      Cognition  Overall Cognitive Status: Appears within functional limits for tasks assessed/performed Arousal/Alertness: Awake/alert Orientation Level: Appears intact for tasks assessed Behavior During Session: [facility] for tasks performed    Extremity/Trunk Assessment Right Upper Extremity Assessment RUE ROM/Strength/Tone: [facility] for tasks assessed Left Upper Extremity Assessment LUE ROM/Strength/Tone: [facility] for tasks assessed   Mobility  Shoulder Instructions  Transfers Transfers: Sit to Stand;Stand to Sit Sit to Stand: 4: Min guard;With upper extremity assist;From chair/3-in-1;From toilet Stand to Sit: 5: Supervision;With upper extremity assist;To toilet;To chair/3-in-1 Details for Transfer Assistance: min VCs for hand placement RLE management and technique.       Exercise     Balance     End of Session OT - End of Session Equipment Utilized During Treatment: Gait belt Activity Tolerance: Patient tolerated treatment well Patient left: in chair;with call bell/phone within reach;with family/visitor present  GO     Brevan Luberto A OTR/L [phone] 11/12/2011, 12:37 PM

## 2011-11-12 NOTE — Progress Notes (Signed)
PT Cancellation Note  Treatment cancelled today due to pt  vomiting this pm, assisted back to bed.  Charlotte Duarte 11/12/2011, 2:09 PM

## 2011-11-12 NOTE — Op Note (Signed)
NAME:  KASHONDA, SARKISYAN NO.:  [id number]  MEDICAL RECORD NO.:  [id number]  LOCATION:  1616                         FACILITY:  [facility]  PHYSICIAN:  Vanita Panda. Magnus Ivan, M.D.DATE OF BIRTH:  09/17/1940  DATE OF PROCEDURE:  11/11/2011 DATE OF DISCHARGE:                              OPERATIVE REPORT   PREOPERATIVE DIAGNOSIS:  End-stage arthritis, right hip.  POSTOPERATIVE DIAGNOSIS:  End-stage arthritis, right hip.  PROCEDURE:  Right total hip arthroplasty through direct anterior approach.  IMPLANTS:  DePuy Sector Gription acetabular component size 52, size 36+ 4 neutral polyethylene liner, size 9 Corail femoral component with standard offset, size 36 -2 metal hip ball.  SURGEON:  Vanita Panda. Magnus Ivan, MD  ANESTHESIA:  Spinal.  BLOOD LOSS:  Between 500 and 700 mL.  COMPLICATIONS:  None.  INDICATIONS:  Ms. Rizzolo is a 71 year old female with end-stage arthritis and debilitating pain involving her right hip.  This has greatly affected her activities of daily living, and she has failed conservative treatment.  At this point, she wished to proceed with a total hip arthroplasty.  The risks and benefits of surgery were explained to her in detail, and she does wish to proceed with surgery.  PROCEDURE DESCRIPTION:  After informed consent was obtained, appropriate right hip was marked.  She was brought to the operating room, and while she was on her stretcher, spinal anesthesia was obtained.  She was then laid in supine position, a Foley catheter was placed and in-line traction boots were placed on both feet.  She was then placed supine on the Hana table with both feet in inline skeletal traction with no traction applied, placed in the traction device and a peroneal post was placed as well.  The right hip was then assessed fluoroscopically so we could obtain the hip center and be able to measure the leg length and offset.  We then prepped the right hip with  DuraPrep and sterile drapes. A time-out was called to identify the correct patient, correct right hip.  I then made an incision just distal and posterior to the anterior- superior iliac spine and carried this obliquely down the leg.  I dissected down to the tensor fascia lata.  The tensor fascia was divided obliquely.  I then proceeded with a direct anterior approach to the hip. A Cobra retractor was placed around the lateral neck and up underneath the rectus femoris, Cobra retractor was placed medially.  I then cauterized the lateral femoral circumflex vessels.  I removed the tissue from about the joint capsule and then divided the joint capsule.  I placed the Cobra retractor within the hip capsule and then made my femoral neck cut just proximal to the lesser trochanter.  This was made with an oscillating saw and I finished this with an osteotome.  I then placed a corkscrew guide in the femoral head and removed the femoral head in its entirety.  I cleaned the acetabulum debris and remnants of the labrum and then began reaming from a size 42 reamer in 2 mm increments all the way up to a size 50 with all reamers placed under direct visualization and the last  reamer placed under direct fluoroscopy.  I then obtained my depth of reaming, my inclination and version.  I then placed the real DePuy Sector Gription acetabular component size 52.  I then placed the apex hole eliminator guide and a single screw.  I placed the real 36+ 4 neutral polyethylene liner and attention was turned to the femur.  With the leg externally rotated to 90 degrees, extended and adducted, I was able to gain access to the femoral canal using a box cutting guide releasing the piriformis and placing retractors medially and laterally.  I then lateralized with a rongeur and began broaching from a size 8 broached up to only a size 9 to fill the canal.  I then trialed various hip balls so I was pleased with my hip ball size,  bringing the leg up and over reduced the hip.  We measured under direct fluoroscopy and this was placed in a leg length and positioning of the components.  I then re-dislocated the hip and placed the real size 9 femoral component which is a Corail with standard offset and the real 36 -2 metal hip ball.  We reduced this back into the acetabulum and was stable.  I was pleased with the range of motion, as well as minimal shuck and again under direct fluoroscopy, I was pleased with the alignment.  We then copiously irrigated the soft tissues with normal saline solution.  I closed the joint capsule with interrupted #1 Ethibond suture, followed by a running 0 V-Loc to the tensor fascia lata, 2-0 Vicryl in subcutaneous tissue, and staples on the skin.  Well- padded sterile dressing was applied.  She was taken off the [facility] table. Her leg lengths were measured to be equal.  She was taken to the recovery room in stable condition.  All final counts correct.  There were no complications noted.     Vanita Panda. Magnus Ivan, M.D.     CYB/MEDQ  D:  11/11/2011  T:  11/12/2011  Job:  [id number]

## 2011-11-12 NOTE — Progress Notes (Signed)
Subjective: 1 Day Post-Op Procedure(s) (LRB): TOTAL HIP ARTHROPLASTY ANTERIOR APPROACH (Right) Patient reports pain as mild.   Has been controlled with PO pain meds.   Objective: Vital signs in last 24 hours: Temp:  [94.4 F (34.7 C)-98.8 F (37.1 C)] 98.5 F (36.9 C) (09/21 1014) Pulse Rate:  [66-114] 85  (09/21 1014) Resp:  [14-18] 14  (09/21 1014) BP: (69-130)/(52-95) 100/63 mmHg (09/21 1014) SpO2:  [97 %-100 %] 100 % (09/21 1014) Weight:  [64.864 kg (143 lb)] 64.864 kg (143 lb) (09/20 1545)  Intake/Output from previous day: 09/20 0701 - 09/21 0700 In: 3887.5 [P.O.:460; I.V.:3272.5; IV Piggyback:155] Out: 1525 [Urine:1100; Blood:425] Intake/Output this shift: Total I/O In: 120 [P.O.:120] Out: -    Basename 11/12/11 0530  HGB 9.8*    Basename 11/12/11 0530  WBC 7.4  RBC 3.26*  HCT 28.2*  PLT 196    Basename 11/12/11 0530  NA 133*  K 3.4*  CL 100  CO2 24  BUN 28*  CREATININE 1.44*  GLUCOSE 118*  CALCIUM 8.1*   No results found for this basename: LABPT:2,INR:2 in the last 72 hours  Neurologically intact  Assessment/Plan: 1 Day Post-Op Procedure(s) (LRB): TOTAL HIP ARTHROPLASTY ANTERIOR APPROACH (Right) Up with therapy  Recheck Hgb in AM,      Saline lock IV  Maxie Debose C 11/12/2011, 10:48 AM

## 2011-11-12 NOTE — Progress Notes (Signed)
Pt not yet voided. Bladder soft, not distended, scanned for 38cc. Dr Ophelia Charter notified & order received. Azir Muzyka, [employer]

## 2011-11-13 LAB — CBC
MCH: 30.5 pg (ref 26.0–34.0)
MCHC: 35.4 g/dL (ref 30.0–36.0)
MCV: 86.1 fL (ref 78.0–100.0)
Platelets: 175 10*3/uL (ref 150–400)
RDW: 13.5 % (ref 11.5–15.5)

## 2011-11-13 NOTE — Progress Notes (Signed)
Physical Therapy Treatment 11/13/11 1122  PT Visit Information  Last PT Received On 11/13/11  Assistance Needed +1  PT Time Calculation  PT Start Time 1055  PT Stop Time 1122  PT Time Calculation (min) 27 min  Subjective Data  Subjective "I'm ready"  Patient Stated Goal to go home tomorrow  Precautions  Precautions None  Restrictions  Weight Bearing Restrictions No  Cognition  Overall Cognitive Status Appears within functional limits for tasks assessed/performed  Arousal/Alertness Awake/alert  Orientation Level Appears intact for tasks assessed  Behavior During Session [facility] for tasks performed  Bed Mobility  Bed Mobility Supine to Sit  Supine to Sit 5: Supervision  Transfers  Transfers Sit to Stand;Stand to Sit  Sit to Stand 5: Supervision  Stand to Sit 5: Supervision  Ambulation/Gait  Ambulation/Gait Assistance 5: Supervision  Ambulation Distance (Feet) 150 Feet  Assistive device Rolling walker  Gait Pattern Step-through pattern  Gait velocity decreased  General Gait Details pt received her RW for home and is able to manage well with it  Stairs Yes  Stairs Assistance Other (comment);4: Min assist (husband present and able to assist pt)  Stair Management Technique No rails;With walker;Backwards  Number of Stairs 3   Wheelchair Mobility  Wheelchair Mobility No  Exercises  Exercises Total Joint  Total Joint Exercises  Ankle Circles/Pumps AROM;Both;10 reps;Supine  Quad Sets AROM;Both;10 reps  Gluteal Sets AROM;Both;10 reps;Standing  Short Arc Quad AROM;Both;15 reps;Supine  Hip ABduction/ADduction Standing;10 reps;AROM;Right  Long Arc Quad Both;10 reps;Seated;AROM  Marching in Standing AROM;Right;10 reps;Standing  Other Exercises  Other Exercises bilateral hip to hip for core activation  PT - End of Session  Activity Tolerance Patient tolerated treatment well  Patient left in chair;with call bell/phone within reach;with family/visitor present  Nurse Communication  Mobility status  PT - Assessment/Plan  Comments on Treatment Session pt improving.  she still c/o some swelling in hip with decreased ability to do heel slide exercise, but she is at supervision level with gait .  Recommend she walk ad lib with nursing and family. Anticipate d/c in am  PT Plan Discharge plan remains appropriate;Frequency remains appropriate  PT Frequency 7X/week  Follow Up Recommendations Home health PT  Acute Rehab PT Goals  PT Goal: Supine/Side to Sit - Progress Progressing toward goal  PT Goal: Sit to Supine/Side - Progress Progressing toward goal  PT Goal: Sit to Stand - Progress Progressing toward goal  PT Goal: Stand to Sit - Progress Progressing toward goal  PT Goal: Ambulate - Progress Progressing toward goal  PT Goal: Up/Down Stairs - Progress Met  PT Goal: Perform Home Exercise Program - Progress Met

## 2011-11-13 NOTE — Progress Notes (Addendum)
Subjective: 2 Days Post-Op Procedure(s) (LRB): TOTAL HIP ARTHROPLASTY ANTERIOR APPROACH (Right) Patient reports pain as mild.    Objective: Vital signs in last 24 hours: Temp:  [97 F (36.1 C)-99.7 F (37.6 C)] 99.7 F (37.6 C) (09/22 0624) Pulse Rate:  [77-98] 90  (09/22 0624) Resp:  [14-20] 20  (09/22 0624) BP: (100-104)/(62-69) 103/69 mmHg (09/22 0624) SpO2:  [92 %-98 %] 92 % (09/22 0624)  Intake/Output from previous day: 09/21 0701 - 09/22 0700 In: 2458.8 [P.O.:960; I.V.:1498.8] Out: 1450 [Urine:950] Intake/Output this shift: Total I/O In: 240 [P.O.:240] Out: 850 [Urine:850]   Basename 11/13/11 0500 11/12/11 0530  HGB 9.2* 9.8*    Basename 11/13/11 0500 11/12/11 0530  WBC 7.6 7.4  RBC 3.02* 3.26*  HCT 26.0* 28.2*  PLT 175 196    Basename 11/12/11 0530  NA 133*  K 3.4*  CL 100  CO2 24  BUN 28*  CREATININE 1.44*  GLUCOSE 118*  CALCIUM 8.1*   No results found for this basename: LABPT:2,INR:2 in the last 72 hours  Neurologically intact  Assessment/Plan: 2 Days Post-Op Procedure(s) (LRB): TOTAL HIP ARTHROPLASTY ANTERIOR APPROACH (Right) Up with therapy  Did stairs,  Wants to go home in AM , has friend who will be helping her.   Charlotte Duarte C 11/13/2011, 11:51 AM  D/C IV

## 2011-11-13 NOTE — Plan of Care (Signed)
Problem: Phase III Progression Outcomes Goal: Anticoagulant follow-up in place Outcome: Not Applicable Date Met:  11/13/11 Not on coumadin

## 2011-11-13 NOTE — Progress Notes (Signed)
Cm spoke with patient concerning dc planning. Per pt choice [facility] to provide [facility] services upon discharge. Cm spoke with on-call RN Maralyn Sago confirming new referral. H/P, demographics, & progress notes faxed to [facility] at [phone]. Awaiting MD orders to fax to agency. Pt request RW. [facility] notified of DME referral. Dme scheduled delivery to room prior to discharge.   Charlotte Duarte [phone]

## 2011-11-13 NOTE — Progress Notes (Signed)
PT Cancellation Note  Treatment cancelled today due to pt sleeping this afternoon. Will walk later with husband.  Donnetta Hail 11/13/2011, 4:10 PM

## 2011-11-13 NOTE — Progress Notes (Signed)
Physical Therapy Evaluation: late note entry 11/12/11 1130  PT Visit Information  Last PT Received On 11/12/11  Assistance Needed +1  PT Time Calculation  PT Start Time 1110  PT Stop Time 1130  PT Time Calculation (min) 20 min  Subjective Data  Subjective pt is anxious to walk  Patient Stated Goal to hike, to ride on a motorcycle  Precautions  Precautions None  Restrictions  Weight Bearing Restrictions No  Home Living  Lives With Spouse  Available Help at Discharge Friend(s);Family  Type of Home House  Home Access Stairs to enter  Entrance Stairs-Number of Steps 3  Entrance Stairs-Rails None  Home Layout One level  Bathroom Radiation protection practitioner chair without back;Walker - rolling  Prior Function  Level of Independence Independent  Able to Take Stairs? Yes  Driving Yes  Vocation Retired  Probation officer Status Appears within functional limits for tasks assessed/performed  Arousal/Alertness Awake/alert  Orientation Level Oriented X4 / Intact  Behavior During Session [facility] for tasks performed  Right Lower Extremity Assessment  RLE ROM/Strength/Tone WFL for tasks assessed  RLE Sensation WFL - Light Touch;WFL - Proprioception  RLE Coordination WFL - gross/fine motor  Left Lower Extremity Assessment  LLE ROM/Strength/Tone WFL  LLE Sensation WFL - Light Touch;WFL - Proprioception  LLE Coordination WFL - gross/fine motor  Trunk Assessment  Trunk Assessment Normal  Bed Mobility  Bed Mobility Supine to Sit  Supine to Sit 4: Min assist  Details for Bed Mobility Assistance pt assisted to EOB, pt with some c/o swelling in RLE  Transfers  Transfers Sit to Stand;Stand to Sit  Sit to Stand 4: Min assist  Stand to Sit 5: Supervision  Details for Transfer Assistance verbal cues for hand placement  Ambulation/Gait  Ambulation/Gait Assistance 4: Min guard  Ambulation Distance (Feet) 100 Feet  Assistive  device Rolling walker  Gait Pattern Step-through pattern  Gait velocity decreased  General Gait Details pt did well with intial gait with RW  Stairs No  Wheelchair Mobility  Wheelchair Mobility No  Balance  Balance Assessed No  PT - End of Session  Equipment Utilized During Treatment Gait belt  Activity Tolerance Patient tolerated treatment well  Patient left in chair;with call bell/phone within reach  Nurse Communication Mobility status  PT Assessment  Clinical Impression Statement 71 yo female s/p anterior THR who is doing very well initially.  Anticipate she will pregress according to path for post op recovery and d/c to home with HHPT  PT Recommendation/Assessment Patient needs continued PT services  PT Problem List Decreased strength;Decreased activity tolerance;Decreased mobility;Decreased knowledge of use of DME  PT Therapy Diagnosis  Difficulty walking;Abnormality of gait  PT Plan  PT Frequency 7X/week  PT Treatment/Interventions DME instruction;Gait training;Stair training;Therapeutic exercise  PT Recommendation  Follow Up Recommendations Home health PT  Equipment Recommended Rolling walker with 5" wheels  Individuals Consulted  Consulted and Agree with Results and Recommendations Patient  Acute Rehab PT Goals  PT Goal Formulation With patient  Time For Goal Achievement 11/14/11  Potential to Achieve Goals Good  Pt will go Supine/Side to Sit Independently  PT Goal: Supine/Side to Sit - Progress Goal set today  Pt will go Sit to Supine/Side Independently  PT Goal: Sit to Supine/Side - Progress Goal set today  Pt will go Sit to Stand Independently  PT Goal: Sit to Stand - Progress Goal set today  Pt will go Stand to  Sit Independently  PT Goal: Stand to Sit - Progress Goal set today  Pt will Ambulate >150 feet;with modified independence  PT Goal: Ambulate - Progress Goal set today  Pt will Go Up / Down Stairs 3-5 stairs;with modified independence;with least restrictive  assistive device  PT Goal: Up/Down Stairs - Progress Goal set today  Pt will Perform Home Exercise Program with supervision, verbal cues required/provided  PT Goal: Perform Home Exercise Program - Progress Goal set today  PT General Charges  $$ ACUTE PT VISIT 1 Procedure  PT Evaluation  $Initial PT Evaluation Tier I 1 Procedure  PT Treatments  $Gait Training 8-22 mins

## 2011-11-14 ENCOUNTER — Encounter (HOSPITAL_COMMUNITY): Payer: Self-pay | Admitting: Orthopaedic Surgery

## 2011-11-14 LAB — CBC
MCHC: 34.5 g/dL (ref 30.0–36.0)
Platelets: 183 10*3/uL (ref 150–400)
RDW: 13.3 % (ref 11.5–15.5)

## 2011-11-14 MED ORDER — HYDROCODONE-ACETAMINOPHEN 5-325 MG PO TABS: 1.0000 | ORAL_TABLET | ORAL | Status: DC | PRN

## 2011-11-14 MED ORDER — FERROUS SULFATE 325 (65 FE) MG PO TABS: 325.0000 mg | ORAL_TABLET | Freq: Three times a day (TID) | ORAL | Status: DC

## 2011-11-14 MED ORDER — METHOCARBAMOL 500 MG PO TABS: 500.0000 mg | ORAL_TABLET | Freq: Four times a day (QID) | ORAL | Status: DC | PRN

## 2011-11-14 MED ORDER — ASPIRIN 325 MG PO TBEC: 325.0000 mg | DELAYED_RELEASE_TABLET | Freq: Every day | ORAL | Status: DC

## 2011-11-14 NOTE — Progress Notes (Signed)
Patient ID: Charlotte Duarte, female   DOB: [birth year], 71 y.o.   MRN: [id number] Looks good. Asymptomatic acute blood loss anemia.  Doing well. Incision C/D/I.  D/C to home today with HHPT.

## 2011-11-14 NOTE — Discharge Summary (Signed)
Patient ID: Charlotte Duarte MRN: [id number] DOB/AGE: 69-20-1942 69 y.o.  Admit date: 11/11/2011 Discharge date: 11/14/2011  Admission Diagnoses:  Principal Problem:  *Degenerative arthritis of hip   Discharge Diagnoses:  Same  Past Medical History  Diagnosis Date  . Hyperlipidemia   . Hypertension   . Atrophic vaginitis   . SUI (stress urinary incontinence, female)   . GERD (gastroesophageal reflux disease)   . H/O hiatal hernia   . Arthritis   . 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 Renal artery stenosis   . Degenerative arthritis of hip 11/11/2011    Surgeries: Procedure(s): TOTAL HIP ARTHROPLASTY ANTERIOR APPROACH on 11/11/2011   Consultants:    Discharged Condition: Improved  Hospital Course: Charlotte Duarte is an 71 y.o. female who was admitted 11/11/2011 for operative treatment ofDegenerative arthritis of hip. Patient has severe unremitting pain that affects sleep, daily activities, and work/hobbies. After pre-op clearance the patient was taken to the operating room on 11/11/2011 and underwent  Procedure(s): TOTAL HIP ARTHROPLASTY ANTERIOR APPROACH.    Patient was given perioperative antibiotics: Anti-infectives     Start     Dose/Rate Route Frequency Ordered Stop   11/11/11 1930   ceFAZolin (ANCEF) IVPB 1 g/50 mL premix        1 g 100 mL/hr over 30 Minutes Intravenous Every 6 hours 11/11/11 1616 11/12/11 0158   11/11/11 1006   ceFAZolin (ANCEF) IVPB 2 g/50 mL premix        2 g 100 mL/hr over 30 Minutes Intravenous 60 min pre-op 11/11/11 1006 11/11/11 1317           Patient was given sequential compression devices, early ambulation, and chemoprophylaxis to prevent DVT.  Patient benefited maximally from hospital stay and there were no complications.    Recent vital signs: Patient Vitals for the past 24 hrs:  BP Temp Temp src Pulse Resp SpO2  11/14/11 0457 100/64 mmHg 99.7 F (37.6 C) Oral 73  16  94 %  12-11-2011 2149 105/66 mmHg 99.5 F (37.5 C) Oral  75  16  97 %  12-11-11 1415 110/53 mmHg 98.4 F (36.9 C) Oral 92  18  95 %     Recent laboratory studies:  Basename 11/14/11 0403 12-11-2011 0500 11/12/11 0530  WBC 6.8 7.6 --  HGB 8.8* 9.2* --  HCT 25.5* 26.0* --  PLT 183 175 --  NA -- -- 133*  K -- -- 3.4*  CL -- -- 100  CO2 -- -- 24  BUN -- -- 28*  CREATININE -- -- 1.44*  GLUCOSE -- -- 118*  INR -- -- --  CALCIUM -- -- 8.1*     Discharge Medications:     Medication List     As of 11/14/2011  6:43 AM    TAKE these medications         aspirin 325 MG EC tablet   Take 1 tablet (325 mg total) by mouth daily with breakfast.      atorvastatin 20 MG tablet   Commonly known as: LIPITOR   Take 20 mg by mouth daily with breakfast.      benazepril 10 MG tablet   Commonly known as: LOTENSIN   Take 10 mg by mouth daily with breakfast.      calcium carbonate 500 MG chewable tablet   Commonly known as: TUMS - dosed in mg elemental calcium   Chew 1 tablet by mouth as needed. Prn reflux  clopidogrel 75 MG tablet   Commonly known as: PLAVIX   Take 75 mg by mouth daily with breakfast.      ferrous sulfate 325 (65 FE) MG tablet   Take 1 tablet (325 mg total) by mouth 3 (three) times daily after meals.      fexofenadine 180 MG tablet   Commonly known as: ALLEGRA   Take 180 mg by mouth every morning.      fluticasone 50 MCG/ACT nasal spray   Commonly known as: FLONASE   Place 2 sprays into the nose as needed.      HYDROcodone-acetaminophen 5-325 MG per tablet   Commonly known as: NORCO/VICODIN   Take 1-2 tablets by mouth every 4 (four) hours as needed for pain.      ibuprofen 200 MG tablet   Commonly known as: ADVIL,MOTRIN   Take 200 mg by mouth every 6 (six) hours as needed. Pain      methocarbamol 500 MG tablet   Commonly known as: ROBAXIN   Take 1 tablet (500 mg total) by mouth every 6 (six) hours as needed.      omeprazole 20 MG capsule   Commonly known as: PRILOSEC   Take 20 mg by mouth as needed. Prn only       triamterene-hydrochlorothiazide 75-50 MG per tablet   Commonly known as: MAXZIDE   Take 1 tablet by mouth daily with breakfast.        Diagnostic Studies: Dg Chest 2 View  11/04/2011  *RADIOLOGY REPORT*  Clinical Data: Preop for right hip replacement  CHEST - 2 VIEW  Comparison: None.  Findings: The cardiomediastinal silhouette is unremarkable.  No acute infiltrate or pleural effusion.  No pulmonary edema. Degenerative changes are noted mid and lower thoracic spine.  IMPRESSION: No active disease.  Degenerative changes mid and lower thoracic spine.   Original Report Authenticated By: Natasha Mead, M.D.    Dg Hip Complete Right  11/11/2011  *RADIOLOGY REPORT*  Clinical Data: Right-sided hip arthroplasty.  RIGHT HIP - COMPLETE 2+ VIEW  Comparison: 03/09/2010.  Findings: Two intraoperative fluoroscopic spot films demonstrates interval right total hip arthroplasty.  The femoral and acetabular components of the prosthesis appear well seated, without obvious periprosthetic fracture or other immediate complicating features. The prosthetic femoral head appears to be located on these two AP projections (no lateral view was submitted).  IMPRESSION: 1.  Postoperative changes of a right total hip arthroplasty without immediate complicating features, as above.   Original Report Authenticated By: Florencia Reasons, M.D.    Dg Pelvis Portable  11/11/2011  *RADIOLOGY REPORT*  Clinical Data: Postop right hip replacement  PORTABLE PELVIS  Comparison: None.  Findings: The right total hip replacement is in good position.  Air is noted in the adjacent soft tissues postoperatively.  No acute bony abnormality is seen.  The pelvic rami are intact.  The SI joints appear normal.  IMPRESSION: Right total hip replacement in good position.   Original Report Authenticated By: Juline Patch, M.D.    Dg Hip Portable 1 View Right  11/11/2011  *RADIOLOGY REPORT*  Clinical Data: Postop right hip replacement  PORTABLE RIGHT HIP -  1 VIEW  Comparison: Intraoperative films of 11/11/2011  Findings: A cross-table lateral view portably shows the femoral stem of the right total hip replacement to be in good position.  No acute fracture is seen.  IMPRESSION: The femoral stem of the right hip replacement is in good position.   Original Report Authenticated By: Renae Fickle  D. Gery Pray, M.D.    Dg C-arm 1-60 Min-no Report  11/11/2011  CLINICAL DATA: right anteriorhip   C-ARM 1-60 MINUTES  Fluoroscopy was utilized by the requesting physician.  No radiographic  interpretation.      Disposition:   To home      Discharge Orders    Future Appointments: Provider: Department: Dept Phone: Center:   01/30/2012 9:05 AM Margaree Mackintosh, MD [facility] [phone] [facility]   01/31/2012 11:00 AM Margaree Mackintosh, MD [facility] [phone] [facility]     Future Orders Please Complete By Expires   Diet - low sodium heart healthy      Call MD / Call 911      Comments:   If you experience chest pain or shortness of breath, CALL 911 and be transported to the hospital emergency room.  If you develope a fever above 101 F, pus (white drainage) or increased drainage or redness at the wound, or calf pain, call your surgeon's office.   Constipation Prevention      Comments:   Drink plenty of fluids.  Prune juice may be helpful.  You may use a stool softener, such as Colace (over the counter) 100 mg twice a day.  Use MiraLax (over the counter) for constipation as needed.   Increase activity slowly as tolerated      Discharge instructions      Comments:   Increase your activities as comfort allows. You can get your current dressing wet in the shower. You can get your actual incision wet starting 11/16/11. Expect thigh and leg/foot swelling.   Discharge patient         Follow-up Information    Follow up with Kathryne Hitch, MD. In 2 weeks.   Contact information:   [facility] [address] [place]  [address] [phone]           Signed: Kathryne Hitch 11/14/2011, 6:43 AM

## 2011-11-14 NOTE — Progress Notes (Signed)
Physical Therapy Treatment Patient Details Name: Charlotte Duarte MRN: [id number] DOB: [birth year] Today's Date: 11/14/2011 Time: 4010-2725 PT Time Calculation (min): 24 min  PT Assessment / Plan / Recommendation Comments on Treatment Session  Pt plans to D/c to home today.  Practiced stairs.    Follow Up Recommendations  Home health PT    Barriers to Discharge        Equipment Recommendations  Other (comment) (RW delivered and in room)    Recommendations for Other Services    Frequency 7X/week   Plan Discharge plan remains appropriate    Precautions / Restrictions Precautions Precautions: None Precaution Comments: Direct Anterior Approach Restrictions Weight Bearing Restrictions: No   Pertinent Vitals/Pain C/o "soreness"    Mobility  Bed Mobility Bed Mobility: Not assessed Details for Bed Mobility Assistance: Pt OOB in recliner  Transfers Transfers: Sit to Stand;Stand to Sit Sit to Stand: 6: Modified independent (Device/Increase time);From chair/3-in-1 Stand to Sit: 6: Modified independent (Device/Increase time);To chair/3-in-1 Details for Transfer Assistance: good safety tech and use of hands  Ambulation/Gait Ambulation/Gait Assistance: 6: Modified independent (Device/Increase time) Ambulation Distance (Feet): 225 Feet Assistive device: Rolling walker Ambulation/Gait Assistance Details: <25% VC's on safety with turns and backward gait. Gait Pattern: Step-through pattern;Decreased stance time - right;Decreased stride length  Stairs: Yes Stairs Assistance: 4: Min assist Stairs Assistance Details (indicate cue type and reason): one initial VC on sequencing, otherwise very well done as she had practiced yesterday with spouse. Stair Management Technique: No rails;Backwards Number of Stairs: 4      PT Goals    progressing    Visit Information  Last PT Received On: 11/14/11 Assistance Needed: +1                   End of Session PT - End of Session Equipment  Utilized During Treatment: Gait belt Activity Tolerance: Patient tolerated treatment well Patient left: in chair;with call bell/phone within reach;with family/visitor present Nurse Communication:  (Pt ready for D/C to home)  Felecia Shelling  PTA [facility]  Acute  Rehab Pager     [phone]

## 2011-11-14 NOTE — Progress Notes (Signed)
CARE MANAGEMENT NOTE 11/14/2011  Patient:  [facility]   Account Number:  [id number]  Date Initiated:  11/13/2011  Documentation initiated by:  DAVIS,TYMEEKA  Subjective/Objective Assessment:   71 yo female admitted s/p total right hip replacement.     Action/Plan:   Home when stable   Anticipated DC Date:  11/14/2011   Anticipated DC Plan:  HOME W HOME HEALTH SERVICES  In-house referral  NA      DC Planning Services  CM consult      PAC Choice  DURABLE MEDICAL EQUIPMENT  HOME HEALTH   Choice offered to / List presented to:  C-1 Patient   DME arranged  Levan Hurst      DME agency  [facility]     HH arranged  HH-2 PT      [facility] agency  [facility]   Status of service:  Completed, signed off Medicare Important Message given?  NA - LOS <3 / Initial given by admissions (If response is "NO", the following Medicare IM given date fields will be blank) Date Medicare IM given:   Date Additional Medicare IM given:    Discharge Disposition:  HOME W HOME HEALTH SERVICES  Per UR Regulation:  Reviewed for med. necessity/level of care/duration of stay  Comments:  11/14/2011 Raynelle Bring BSN CCM [facility] notified of patient 's discharge to home. They will call MD office to get telephone order for HHPT.

## 2011-11-23 DIAGNOSIS — Z96649 Presence of unspecified artificial hip joint: Secondary | ICD-10-CM

## 2011-12-29 ENCOUNTER — Encounter: Payer: Self-pay | Admitting: Obstetrics and Gynecology

## 2012-01-30 ENCOUNTER — Other Ambulatory Visit: Payer: Medicare Other | Admitting: Internal Medicine

## 2012-01-31 ENCOUNTER — Encounter: Payer: Medicare Other | Admitting: Internal Medicine

## 2012-02-22 ENCOUNTER — Ambulatory Visit (INDEPENDENT_AMBULATORY_CARE_PROVIDER_SITE_OTHER): Payer: Medicare Other | Admitting: Family Medicine

## 2012-02-22 VITALS — BP 100/65 | HR 87 | Temp 98.4°F | Resp 18 | Wt 149.0 lb

## 2012-02-22 DIAGNOSIS — I10 Essential (primary) hypertension: Secondary | ICD-10-CM

## 2012-02-22 DIAGNOSIS — Z1159 Encounter for screening for other viral diseases: Secondary | ICD-10-CM

## 2012-02-22 DIAGNOSIS — E7800 Pure hypercholesterolemia, unspecified: Secondary | ICD-10-CM

## 2012-02-22 DIAGNOSIS — J029 Acute pharyngitis, unspecified: Secondary | ICD-10-CM

## 2012-02-22 DIAGNOSIS — E781 Pure hyperglyceridemia: Secondary | ICD-10-CM

## 2012-02-22 LAB — POCT CBC
Granulocyte percent: 72.3 %G (ref 37–80)
Lymph, poc: 1.7 (ref 0.6–3.4)
MCHC: 31.1 g/dL — AB (ref 31.8–35.4)
MPV: 8.6 fL (ref 0–99.8)
POC Granulocyte: 5.6 (ref 2–6.9)
POC LYMPH PERCENT: 21.3 %L (ref 10–50)
POC MID %: 6.4 %M (ref 0–12)
Platelet Count, POC: 338 10*3/uL (ref 142–424)
RDW, POC: 13.7 %

## 2012-02-22 NOTE — Patient Instructions (Addendum)
Let us know if you are not better in the next couple of weeks- sooner if you get worse.  Please come by at your convenience for your blood work in the next couple of months.  Then we can do a physical when it is convenient for you.

## 2012-02-22 NOTE — Progress Notes (Signed)
[facility] [address] [place] [address] [phone] [phone]  Date:  02/22/2012   Name:  Charlotte Duarte   DOB:  11/11/1940   MRN:  [id number]  PCP:  Margaree Mackintosh, MD    Chief Complaint: URI   History of Present Illness:  Charlotte Duarte is a 72 y.o. very pleasant female patient who presents with the following:  She is here today with a URI.  She will be visiting her brother in [place] soon and wants to be sure she does not have anything contagious.  He is quite ill with ALS and is just getting out of the ICU and planning to come home.    She notes a little bit of sinus pressure, some ear discomfort and congestion in her throat.  She is coughing a little bit.  No fever, no aches or chills. She has had these symptoms for 2 or 3 weeks and thinks they may be due to allergies.  She does take allegra every day.   No GI symptoms.   She is interested in becoming our regular patient here.  Let her know I am happy to take care of her- she wants to come in for fasting labs in the next few weeks so I will order these today.  She also wishes to have a Hep C screening   Patient Active Problem List  Diagnosis  . RENAL DISEASE, CHRONIC, STAGE III  . SHOULDER PAIN, RIGHT  . BICEPS TENDINITIS, RIGHT  . HIP PAIN, RIGHT  . BACK PAIN  . ITBS, RIGHT KNEE  . Hypertension  . Hyperlipidemia  . Atrophic vaginitis  . SUI (stress urinary incontinence, female)  . GE reflux  . Spinal stenosis  . History of renal artery stenosis  . Impaired glucose tolerance  . Allergic rhinitis  . Degenerative arthritis of hip    Past Medical History  Diagnosis Date  . Hyperlipidemia   . Hypertension   . Atrophic vaginitis   . SUI (stress urinary incontinence, female)   . GERD (gastroesophageal reflux disease)   . H/O hiatal hernia   . Arthritis   . 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 Renal artery stenosis   . Degenerative arthritis of hip 11/11/2011    Past Surgical History   Procedure Date  . Carpal tunnel release 1995    right  . Knee arthroscopy     BOTH KNEES  . Cataract extraction 20 yrs ago    both eyes   . Renal stint     RENAL STINT LEFT RENAL ARTERY IN 2005.  RENAL STINT REPLACE 03/2005.  . Bilateral capsulectomy 2008  . Augmentation mammaplasty 1980     both breasts implants REMOVED IN 2005  . Total hip arthroplasty 11/11/2011    Procedure: TOTAL HIP ARTHROPLASTY ANTERIOR APPROACH;  Surgeon: Kathryne Hitch, MD;  Location: [facility];  Service: Orthopedics;  Laterality: Right;  Right Total Hip Arthroplasty    History  Substance Use Topics  . Smoking status: Former Smoker -- 1.0 packs/day for 23 years    Types: Cigarettes    Quit date: 07/27/1970  . Smokeless tobacco: Never Used  . Alcohol Use: 4.2 oz/week    7 Glasses of wine per week     Comment: 1-2 glasses of wine daily    Family History  Problem Relation Age of Onset  . Mental illness Mother   . Breast cancer Mother   . Hypertension Mother   .  Heart disease Father   . Stroke Brother     No Known Allergies  Medication list has been reviewed and updated.  Current Outpatient Prescriptions on File Prior to Visit  Medication Sig Dispense Refill  . aspirin EC 325 MG EC tablet Take 1 tablet (325 mg total) by mouth daily with breakfast.  30 tablet  0  . atorvastatin (LIPITOR) 20 MG tablet Take 20 mg by mouth daily with breakfast.      . benazepril (LOTENSIN) 10 MG tablet Take 10 mg by mouth daily with breakfast.      . calcium carbonate (TUMS - DOSED IN MG ELEMENTAL CALCIUM) 500 MG chewable tablet Chew 1 tablet by mouth as needed. Prn reflux      . clopidogrel (PLAVIX) 75 MG tablet Take 75 mg by mouth daily with breakfast.      . ferrous sulfate 325 (65 FE) MG tablet Take 1 tablet (325 mg total) by mouth 3 (three) times daily after meals.  30 tablet  0  . fexofenadine (ALLEGRA) 180 MG tablet Take 180 mg by mouth every morning.       . fluticasone (FLONASE) 50 MCG/ACT nasal spray  Place 2 sprays into the nose as needed.      Marland Kitchen ibuprofen (ADVIL,MOTRIN) 200 MG tablet Take 200 mg by mouth every 6 (six) hours as needed. Pain      . omeprazole (PRILOSEC) 20 MG capsule Take 20 mg by mouth as needed. Prn only      . triamterene-hydrochlorothiazide (MAXZIDE) 75-50 MG per tablet Take 1 tablet by mouth daily with breakfast.        Review of Systems:  As per HPI- otherwise negative.   Physical Examination: Filed Vitals:   02/22/12 1530  BP: 100/65  Pulse: 87  Temp: 98.4 F (36.9 C)  Resp: 18   Filed Vitals:   02/22/12 1530  Weight: 149 lb (67.586 kg)   There is no height on file to calculate BMI. Ideal Body Weight:    GEN: WDWN, NAD, Non-toxic, A & O x 3 HEENT: Atraumatic, Normocephalic. Neck supple. No masses, No LAD. Bilateral TM wnl, oropharynx normal.  PEERL,EOMI.   Ears and Nose: No external deformity. CV: RRR, No M/G/R. No JVD. No thrill. No extra heart sounds. PULM: CTA B, no wheezes, crackles, rhonchi. No retractions. No resp. distress. No accessory muscle use. ABD: S, NT, ND. No rebound. No HSM. EXTR: No c/c/e NEURO Normal gait.  PSYCH: Normally interactive. Conversant. Not depressed or anxious appearing.  Calm demeanor.   Results for orders placed in visit on 02/22/12  POCT RAPID Duarte A (OFFICE)      Component Value Range   Rapid Duarte A Screen Negative  Negative  POCT CBC      Component Value Range   WBC 7.8  4.6 - 10.2 K/uL   Lymph, poc 1.7  0.6 - 3.4   POC LYMPH PERCENT 21.3  10 - 50 %L   MID (cbc) 0.5  0 - 0.9   POC MID % 6.4  0 - 12 %M   POC Granulocyte 5.6  2 - 6.9   Granulocyte percent 72.3  37 - 80 %G   RBC 4.49  4.04 - 5.48 M/uL   Hemoglobin 12.3  12.2 - 16.2 g/dL   HCT, POC 96.0  45.4 - 47.9 %   MCV 87.9  80 - 97 fL   MCH, POC 27.4  27 - 31.2 pg   MCHC 31.1 (*) 31.8 - 35.4  g/dL   RDW, POC 16.1     Platelet Count, POC 338  142 - 424 K/uL   MPV 8.6  0 - 99.8 fL   Anemia resolved- she had a total hip a few months ago and lost  blood during her operation.    Assessment and Plan: 1. Sore throat  POCT rapid Duarte A, POCT CBC  2. High cholesterol  Lipid panel  3. HTN (hypertension)  Comprehensive metabolic panel, TSH  4. Need for hepatitis C screening test  Hepatitis C antibody   Likely viral URI or allergies.  Reassured Charlotte Duarte, but any viral illness can be contagious.  She will practice careful hand hygiene and use a mask if she is coughing.    She would like to have fasting labs ordered for later- we will check Hep C as well per her request.  These labs are in the system and she will return at her convenience for a blood draw.   Abbe Amsterdam, MD

## 2012-03-26 ENCOUNTER — Other Ambulatory Visit: Payer: Medicare Other | Admitting: Internal Medicine

## 2012-03-26 DIAGNOSIS — Z0000 Encounter for general adult medical examination without abnormal findings: Secondary | ICD-10-CM

## 2012-03-26 DIAGNOSIS — Z79899 Other long term (current) drug therapy: Secondary | ICD-10-CM

## 2012-03-26 DIAGNOSIS — E785 Hyperlipidemia, unspecified: Secondary | ICD-10-CM

## 2012-03-26 LAB — CBC WITH DIFFERENTIAL/PLATELET
Basophils Absolute: 0 10*3/uL (ref 0.0–0.1)
Eosinophils Relative: 6 % — ABNORMAL HIGH (ref 0–5)
Lymphocytes Relative: 27 % (ref 12–46)
Neutro Abs: 2.5 10*3/uL (ref 1.7–7.7)
Neutrophils Relative %: 55 % (ref 43–77)
Platelets: 284 10*3/uL (ref 150–400)
RDW: 14.9 % (ref 11.5–15.5)
WBC: 4.6 10*3/uL (ref 4.0–10.5)

## 2012-03-26 LAB — TSH: TSH: 1.517 u[IU]/mL (ref 0.350–4.500)

## 2012-03-26 LAB — COMPREHENSIVE METABOLIC PANEL
ALT: 18 U/L (ref 0–35)
AST: 25 U/L (ref 0–37)
Calcium: 9.8 mg/dL (ref 8.4–10.5)
Chloride: 105 mEq/L (ref 96–112)
Creat: 1.44 mg/dL — ABNORMAL HIGH (ref 0.50–1.10)
Potassium: 4.2 mEq/L (ref 3.5–5.3)
Sodium: 140 mEq/L (ref 135–145)

## 2012-03-26 LAB — LIPID PANEL: Total CHOL/HDL Ratio: 2.9 Ratio

## 2012-03-27 ENCOUNTER — Encounter: Payer: Self-pay | Admitting: Internal Medicine

## 2012-03-27 ENCOUNTER — Ambulatory Visit (INDEPENDENT_AMBULATORY_CARE_PROVIDER_SITE_OTHER): Payer: Medicare Other | Admitting: Internal Medicine

## 2012-03-27 VITALS — BP 118/74 | HR 76 | Temp 98.6°F | Ht 60.5 in | Wt 148.0 lb

## 2012-03-27 DIAGNOSIS — Z8679 Personal history of other diseases of the circulatory system: Secondary | ICD-10-CM

## 2012-03-27 DIAGNOSIS — Z87448 Personal history of other diseases of urinary system: Secondary | ICD-10-CM

## 2012-03-27 DIAGNOSIS — N189 Chronic kidney disease, unspecified: Secondary | ICD-10-CM

## 2012-03-27 DIAGNOSIS — Z0000 Encounter for general adult medical examination without abnormal findings: Secondary | ICD-10-CM

## 2012-03-27 DIAGNOSIS — E785 Hyperlipidemia, unspecified: Secondary | ICD-10-CM

## 2012-03-27 DIAGNOSIS — M48061 Spinal stenosis, lumbar region without neurogenic claudication: Secondary | ICD-10-CM

## 2012-03-27 DIAGNOSIS — R7302 Impaired glucose tolerance (oral): Secondary | ICD-10-CM

## 2012-03-27 DIAGNOSIS — K219 Gastro-esophageal reflux disease without esophagitis: Secondary | ICD-10-CM

## 2012-03-27 LAB — POCT URINALYSIS DIPSTICK
Blood, UA: NEGATIVE
Glucose, UA: NEGATIVE
Nitrite, UA: NEGATIVE
Urobilinogen, UA: NEGATIVE
pH, UA: 6

## 2012-03-27 LAB — VITAMIN D 25 HYDROXY (VIT D DEFICIENCY, FRACTURES): Vit D, 25-Hydroxy: 32 ng/mL (ref 30–89)

## 2012-03-27 NOTE — Progress Notes (Signed)
Subjective:    Patient ID: Charlotte Duarte, female    DOB: 1940/11/20, 72 y.o.   MRN: [id number]  HPI Patient  Had  total right hip replacement by Dr. Rayburn Ma Sept 2013 and has done well. Labs reviewed and are acceptable. Hx chronic kidney disease Stage III, HTN, hyperlipidemia, GE reflux. BP stable on Lotensin and Maxzide. Also takes Plavix for hx of renal artery stenosis s/p left renal artery PTCA and stenting in 2005 with PCI of an in stent stenosis 2007. Had epidural steroid injection for lumbar spinal stenosis 2013.  Her creatinine ranges around 1.4. No proteinuria. Blood pressure control as been excellent. Kidney size stable. Serial Doppler assessments of renal artery since 2008 stable.  Patient had breast augmentation in 1980 and subsequently had implant removal in 2007. Has had arthroscopy of both knees in 1990 and 1995 by Dr. Wyline Mood. History of allergic rhinitis. Prior colonoscopy by Dr. Sherin Quarry who has since retired. She needs to check about when next colonoscopy is due.  Social alcohol consumption consisting approximately 10 alcoholic beverages per week. She is a lack toe ovo-vegetarian. History of right bundle branch block on EKG. History of GE reflux. Zostavax vaccine given 10/27/2005. History of carpal tunnel release 1993. Sigmoidoscopy 1998. . Quit smoking in 1975.  Family history: Father died of acute MI at age 54. Mother with history of breast cancer and aneurysm in her leg. Brother with history of brain aneurysm. Another brother with history of ALS. Both parents with history of hypertension.  Patient is employed as a Human resources officer. Has a 4 year college degree and is married.    Review of Systems  Constitutional: Negative.   HENT: Negative.   Eyes: Negative.   Respiratory: Negative.   Cardiovascular: Negative.   Gastrointestinal: Negative.   Endocrine: Negative.   Genitourinary: Negative.   Allergic/Immunologic: Negative.   Neurological: Negative.    Hematological: Negative.   Psychiatric/Behavioral: Negative.        Objective:   Physical Exam  Vitals reviewed. Constitutional: She is oriented to person, place, and time. She appears well-developed and well-nourished. No distress.  HENT:  Head: Normocephalic and atraumatic.  Right Ear: External ear normal.  Left Ear: External ear normal.  Mouth/Throat: No oropharyngeal exudate.  Eyes: Conjunctivae and EOM are normal. Pupils are equal, round, and reactive to light. Right eye exhibits no discharge. Left eye exhibits no discharge. No scleral icterus.  Neck: Neck supple. No JVD present. No thyromegaly present.  Cardiovascular: Normal rate, regular rhythm, normal heart sounds and intact distal pulses.   No murmur heard. Pulmonary/Chest: Effort normal and breath sounds normal. She has no wheezes. She has no rales.  Abdominal: Soft. Bowel sounds are normal. She exhibits no distension and no mass. There is no tenderness. There is no rebound and no guarding.  Musculoskeletal: She exhibits no edema.  Lymphadenopathy:    She has no cervical adenopathy.  Neurological: She is alert and oriented to person, place, and time. She has normal reflexes. No cranial nerve deficit.  Skin: Skin is warm and dry. No rash noted. She is not diaphoretic.  Psychiatric: She has a normal mood and affect. Her behavior is normal. Judgment and thought content normal.          Assessment & Plan:  Chronic kidney disease stage III.  Hypertension-stable  Hyperlipidemia-stable  History of allergic rhinitis  50s lumbar spinal stenosis  History of right hip replacement  History of breast augmentation with removal of implants 2007  Plan: Patient  is to return in 6 months for fasting lipid panel liver functions blood pressure check office visit hemoglobin A1c. Continues to see Dr. Eliott Nine for followup of kidney disease.

## 2012-04-02 ENCOUNTER — Telehealth: Payer: Self-pay | Admitting: Internal Medicine

## 2012-04-02 NOTE — Telephone Encounter (Signed)
Please refill meds requested for one year

## 2012-04-04 ENCOUNTER — Other Ambulatory Visit: Payer: Self-pay

## 2012-04-04 MED ORDER — TRIAMTERENE-HCTZ 75-50 MG PO TABS: 1.0000 | ORAL_TABLET | Freq: Every day | ORAL | Status: DC

## 2012-04-04 MED ORDER — CLOPIDOGREL BISULFATE 75 MG PO TABS: 75.0000 mg | ORAL_TABLET | Freq: Every day | ORAL | Status: DC

## 2012-04-04 MED ORDER — ATORVASTATIN CALCIUM 20 MG PO TABS: 20.0000 mg | ORAL_TABLET | Freq: Every day | ORAL | Status: DC

## 2012-04-04 MED ORDER — BENAZEPRIL HCL 10 MG PO TABS: 10.0000 mg | ORAL_TABLET | Freq: Every day | ORAL | Status: DC

## 2012-05-16 NOTE — Patient Instructions (Addendum)
Continue same medications and return in 6 months 

## 2012-06-21 ENCOUNTER — Ambulatory Visit: Payer: Medicare Other

## 2012-06-21 ENCOUNTER — Ambulatory Visit (INDEPENDENT_AMBULATORY_CARE_PROVIDER_SITE_OTHER): Payer: Medicare Other | Admitting: Family Medicine

## 2012-06-21 VITALS — BP 118/62 | HR 84 | Temp 98.1°F | Resp 16 | Ht 61.5 in | Wt 150.6 lb

## 2012-06-21 DIAGNOSIS — M7989 Other specified soft tissue disorders: Secondary | ICD-10-CM

## 2012-06-21 DIAGNOSIS — M79609 Pain in unspecified limb: Secondary | ICD-10-CM

## 2012-06-21 DIAGNOSIS — M79674 Pain in right toe(s): Secondary | ICD-10-CM

## 2012-06-21 LAB — POCT CBC
Granulocyte percent: 72.3 %G (ref 37–80)
HCT, POC: 42.8 % (ref 37.7–47.9)
Hemoglobin: 13.5 g/dL (ref 12.2–16.2)
Lymph, poc: 1.7 (ref 0.6–3.4)
POC Granulocyte: 5.8 (ref 2–6.9)
RBC: 4.87 M/uL (ref 4.04–5.48)

## 2012-06-21 LAB — URIC ACID: Uric Acid, Serum: 7.1 mg/dL — ABNORMAL HIGH (ref 2.4–7.0)

## 2012-06-21 MED ORDER — CEPHALEXIN 500 MG PO CAPS: 500.0000 mg | ORAL_CAPSULE | Freq: Three times a day (TID) | ORAL | Status: DC

## 2012-06-21 NOTE — Progress Notes (Signed)
[address]   [place], [place]  [address]   [phone]  Subjective:    Patient ID: Charlotte Duarte, female    DOB: 1941-01-30, 72 y.o.   MRN: [id number]  HPI This 72 y.o. female presents for evaluation of R second toe distal pain, swelling, redness.  No injury; no trauma.  No history of gout.  Some mild pain with ROM of DIP.  Did have pedicure three weeks ago.  No drainage from nail.   No ingrown toenail.  Has artificial hip so worried about infection; also has renal stent so cannot use NSAIDs regularly; did take Advil last night for pain, swelling.   Review of Systems  Constitutional: Negative for fever, chills, diaphoresis and fatigue.  Musculoskeletal: Positive for joint swelling and arthralgias.  Skin: Positive for color change. Negative for pallor, rash and wound.    Past Medical History  Diagnosis Date  . Hyperlipidemia   . Hypertension   . Atrophic vaginitis   . SUI (stress urinary incontinence, female)   . GERD (gastroesophageal reflux disease)   . H/O hiatal hernia   . Arthritis   . 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 Renal artery stenosis   . Degenerative arthritis of hip 11/11/2011    Past Surgical History  Procedure Laterality Date  . Carpal tunnel release  1995    right  . Knee arthroscopy      BOTH KNEES  . Cataract extraction  20 yrs ago    both eyes   . Renal stint      RENAL STINT LEFT RENAL ARTERY IN 2005.  RENAL STINT REPLACE 03/2005.  . Bilateral capsulectomy  2008  . Augmentation mammaplasty  1980     both breasts implants REMOVED IN 2005  . Total hip arthroplasty  11/11/2011    Procedure: TOTAL HIP ARTHROPLASTY ANTERIOR APPROACH;  Surgeon: Kathryne Hitch, MD;  Location: [facility];  Service: Orthopedics;  Laterality: Right;  Right Total Hip Arthroplasty    Prior to Admission medications   Medication Sig Start Date End Date Taking? Authorizing Provider  aspirin EC 325 MG EC tablet Take 1 tablet (325 mg total) by mouth daily with  breakfast. 11/14/11  Yes Kathryne Hitch, MD  atorvastatin (LIPITOR) 20 MG tablet Take 1 tablet (20 mg total) by mouth daily with breakfast. 04/04/12  Yes Margaree Mackintosh, MD  benazepril (LOTENSIN) 10 MG tablet Take 1 tablet (10 mg total) by mouth daily with breakfast. 04/04/12  Yes Margaree Mackintosh, MD  calcium carbonate (TUMS - DOSED IN MG ELEMENTAL CALCIUM) 500 MG chewable tablet Chew 1 tablet by mouth as needed. Prn reflux   Yes Historical Provider, MD  clopidogrel (PLAVIX) 75 MG tablet Take 1 tablet (75 mg total) by mouth daily with breakfast. 04/04/12  Yes Margaree Mackintosh, MD  fexofenadine (ALLEGRA) 180 MG tablet Take 180 mg by mouth every morning.    Yes Historical Provider, MD  fluticasone (FLONASE) 50 MCG/ACT nasal spray Place 2 sprays into the nose as needed. 09/08/11  Yes Margaree Mackintosh, MD  ibuprofen (ADVIL,MOTRIN) 200 MG tablet Take 200 mg by mouth every 6 (six) hours as needed. Pain   Yes Historical Provider, MD  omeprazole (PRILOSEC) 20 MG capsule Take 20 mg by mouth as needed. Prn only   Yes Historical Provider, MD  triamterene-hydrochlorothiazide (MAXZIDE) 75-50 MG per tablet Take 1 tablet by mouth daily with breakfast. 04/04/12  Yes Margaree Mackintosh, MD  cephALEXin (KEFLEX) 500 MG capsule Take 1 capsule (500 mg total) by mouth 3 (three) times daily. 06/21/12   Ethelda Chick, MD    No Known Allergies  History   Social History  . Marital Status: Married    Spouse Name: N/A    Number of Children: N/A  . Years of Education: N/A   Occupational History  . Not on file.   Social History Main Topics  . Smoking status: Former Smoker -- 1.00 packs/day for 23 years    Types: Cigarettes    Quit date: 07/27/1970  . Smokeless tobacco: Never Used  . Alcohol Use: 4.2 oz/week    7 Glasses of wine per week     Comment: 1-2 glasses of wine daily  . Drug Use: No  . Sexually Active: Yes    Birth Control/ Protection: Post-menopausal   Other Topics Concern  . Not on file   Social History  Narrative  . No narrative on file    Family History  Problem Relation Age of Onset  . Mental illness Mother   . Breast cancer Mother   . Hypertension Mother   . Heart disease Father   . Stroke Brother        Objective:   Physical Exam  Nursing note and vitals reviewed. Constitutional: She is oriented to person, place, and time. She appears well-developed and well-nourished. No distress.  Musculoskeletal:  R SECOND TOE:  SWELLING DISTAL TOE WITH ASSOCIATED ERYTHEMA, SWELLING; MILD PAIN WITH ROM OF DIP.  Neurological: She is alert and oriented to person, place, and time.  Skin: Skin is warm. No rash noted. She is not diaphoretic. There is erythema.  R second distal toe with erythema, swelling, tenderness proximal to nailbed.  No fluctuants; no streaking.  No drainage along nailbed.  Psychiatric: She has a normal mood and affect. Her behavior is normal.       Results for orders placed in visit on 06/21/12  POCT CBC      Result Value Range   WBC 8.0  4.6 - 10.2 K/uL   Lymph, poc 1.7  0.6 - 3.4   POC LYMPH PERCENT 21.1  10 - 50 %L   MID (cbc) 0.5  0 - 0.9   POC MID % 6.6  0 - 12 %M   POC Granulocyte 5.8  2 - 6.9   Granulocyte percent 72.3  37 - 80 %G   RBC 4.87  4.04 - 5.48 M/uL   Hemoglobin 13.5  12.2 - 16.2 g/dL   HCT, POC 40.9  81.1 - 47.9 %   MCV 87.9  80 - 97 fL   MCH, POC 27.7  27 - 31.2 pg   MCHC 31.5 (*) 31.8 - 35.4 g/dL   RDW, POC 91.4     Platelet Count, POC 340  142 - 424 K/uL   MPV 7.9  0 - 99.8 fL   [facility] reading (PRIMARY) by  Dr. Katrinka Blazing.  R 2nd toe:  Old fracture distal phalynx; no acute fracture or bony abnormality.   Assessment & Plan:  Toe pain, right - Plan: POCT CBC, Uric acid, DG Toe 2nd Right  Pain in toe of right foot  Toe swelling - Plan: cephALEXin (KEFLEX) 500 MG capsule   1. Pain R toe 2nd:  New. Obtain CBC, Uric Acid.  Recommend Tylenol PRN. 2.  Toe swelling R with erythema:  New. Most consistent with cellulitis/paronychia/ingrown  toenail. Recommend Tylenol for pain; recommend soaking toe twice daily; also  prescribed Keflex.  RTC increased swelling, pain, redness.  Obtain uric acid yet unusual location for gout.   Meds ordered this encounter  Medications  . cephALEXin (KEFLEX) 500 MG capsule    Sig: Take 1 capsule (500 mg total) by mouth 3 (three) times daily.    Dispense:  30 capsule    Refill:  0

## 2012-06-21 NOTE — Patient Instructions (Addendum)
1. Soak foot for 15-20 minutes twice daily for next week. 2. Return for increased swelling, pain, redness.

## 2012-09-13 ENCOUNTER — Encounter: Payer: Self-pay | Admitting: Internal Medicine

## 2012-09-13 ENCOUNTER — Ambulatory Visit (INDEPENDENT_AMBULATORY_CARE_PROVIDER_SITE_OTHER): Payer: Medicare Other | Admitting: Internal Medicine

## 2012-09-13 VITALS — BP 114/76 | HR 76 | Temp 98.4°F | Wt 148.0 lb

## 2012-09-13 DIAGNOSIS — M25551 Pain in right hip: Secondary | ICD-10-CM

## 2012-09-13 DIAGNOSIS — M19049 Primary osteoarthritis, unspecified hand: Secondary | ICD-10-CM | POA: Insufficient documentation

## 2012-09-13 DIAGNOSIS — R5381 Other malaise: Secondary | ICD-10-CM

## 2012-09-13 DIAGNOSIS — M79609 Pain in unspecified limb: Secondary | ICD-10-CM

## 2012-09-13 DIAGNOSIS — M25559 Pain in unspecified hip: Secondary | ICD-10-CM

## 2012-09-13 LAB — COMPREHENSIVE METABOLIC PANEL
AST: 23 U/L (ref 0–37)
Albumin: 4.1 g/dL (ref 3.5–5.2)
BUN: 34 mg/dL — ABNORMAL HIGH (ref 6–23)
Calcium: 9.8 mg/dL (ref 8.4–10.5)
Chloride: 101 mEq/L (ref 96–112)
Glucose, Bld: 86 mg/dL (ref 70–99)
Potassium: 3.9 mEq/L (ref 3.5–5.3)
Sodium: 138 mEq/L (ref 135–145)
Total Protein: 6.6 g/dL (ref 6.0–8.3)

## 2012-09-13 MED ORDER — TRIAMTERENE-HCTZ 75-50 MG PO TABS: 1.0000 | ORAL_TABLET | Freq: Every day | ORAL | Status: DC

## 2012-09-13 NOTE — Patient Instructions (Addendum)
Await lab results for further instructions. Stop Lipitor x 3 weeks and see if joint pain improves

## 2012-09-14 LAB — ANA: Anti Nuclear Antibody(ANA): NEGATIVE

## 2012-09-14 LAB — CBC WITH DIFFERENTIAL/PLATELET
Basophils Absolute: 0 10*3/uL (ref 0.0–0.1)
HCT: 37.4 % (ref 36.0–46.0)
Hemoglobin: 12.6 g/dL (ref 12.0–15.0)
Lymphocytes Relative: 14 % (ref 12–46)
Monocytes Absolute: 0.8 10*3/uL (ref 0.1–1.0)
Monocytes Relative: 9 % (ref 3–12)
Neutro Abs: 6.8 10*3/uL (ref 1.7–7.7)
RBC: 4.34 MIL/uL (ref 3.87–5.11)
WBC: 9 10*3/uL (ref 4.0–10.5)

## 2012-09-14 LAB — ROCKY MTN SPOTTED FVR ABS PNL(IGG+IGM): RMSF IgM: 0.12 IV

## 2012-10-11 ENCOUNTER — Other Ambulatory Visit: Payer: Medicare Other | Admitting: Internal Medicine

## 2012-10-11 DIAGNOSIS — Z79899 Other long term (current) drug therapy: Secondary | ICD-10-CM

## 2012-10-11 DIAGNOSIS — I10 Essential (primary) hypertension: Secondary | ICD-10-CM

## 2012-10-11 DIAGNOSIS — E785 Hyperlipidemia, unspecified: Secondary | ICD-10-CM

## 2012-10-11 LAB — HEPATIC FUNCTION PANEL
ALT: 16 U/L (ref 0–35)
AST: 21 U/L (ref 0–37)
Albumin: 4.3 g/dL (ref 3.5–5.2)
Alkaline Phosphatase: 72 U/L (ref 39–117)
Total Protein: 6.5 g/dL (ref 6.0–8.3)

## 2012-10-11 LAB — LIPID PANEL
Cholesterol: 209 mg/dL — ABNORMAL HIGH (ref 0–200)
HDL: 48 mg/dL (ref >39)
LDL Cholesterol: 136 mg/dL — ABNORMAL HIGH (ref 0–99)
Triglycerides: 124 mg/dL (ref <150)

## 2012-10-11 LAB — BASIC METABOLIC PANEL
CO2: 25 mEq/L (ref 19–32)
Calcium: 10 mg/dL (ref 8.4–10.5)
Chloride: 102 mEq/L (ref 96–112)
Glucose, Bld: 78 mg/dL (ref 70–99)
Potassium: 4.4 mEq/L (ref 3.5–5.3)
Sodium: 137 mEq/L (ref 135–145)

## 2012-10-12 ENCOUNTER — Encounter: Payer: Self-pay | Admitting: Internal Medicine

## 2012-10-12 ENCOUNTER — Ambulatory Visit (INDEPENDENT_AMBULATORY_CARE_PROVIDER_SITE_OTHER): Payer: Medicare Other | Admitting: Internal Medicine

## 2012-10-12 VITALS — BP 108/72 | HR 76 | Wt 149.0 lb

## 2012-10-12 DIAGNOSIS — I10 Essential (primary) hypertension: Secondary | ICD-10-CM

## 2012-10-12 DIAGNOSIS — N183 Chronic kidney disease, stage 3 unspecified: Secondary | ICD-10-CM

## 2012-10-12 DIAGNOSIS — E785 Hyperlipidemia, unspecified: Secondary | ICD-10-CM

## 2012-10-12 DIAGNOSIS — Z789 Other specified health status: Secondary | ICD-10-CM

## 2012-10-12 DIAGNOSIS — Z888 Allergy status to other drugs, medicaments and biological substances status: Secondary | ICD-10-CM

## 2012-10-12 NOTE — Progress Notes (Signed)
  Subjective:    Patient ID: Charlotte Duarte, female    DOB: [birth year], 72 y.o.   MRN: [id number]  HPI At last visit, we decided she would discontinue statin therapy because of complaint of arthralgias and myalgias. We did extensive lab work and did not turn up any sort of inflammatory arthritis or tickborne illness. She's feeling better off Lipitor 20 mg daily. Myalgias and arthralgias have improved. However she is willing to try Crestor. With her history of chronic kidney disease stage III and stent placement, she likely needs cholesterol-lowering medication. Fatigue has improved somewhat. She's worried about her blood pressure being too low. Sometimes feels a bit dizzy when she stands. 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 She really can't take anti-inflammatory medications because of stage III chronic kidney disease. She could take some Tylenol sparingly.    Review of Systems     Objective:   Physical Exam not examined today. Spent 20 minutes speaking with patient about her issues. I do not want to change her antihypertensive medication at this point in time. I am willing to try her on Crestor 5 mg daily and have her return in 3 months for office visit, lipid panel and liver functions. Patient had lipid panel drawn off statin medication just recently. Total cholesterol is now 209 off statin medication and previously was 143 on Lipitor 20 mg daily. LDL cholesterol is now 136 and previously was 77 on Lipitor 20 mg daily.        Assessment & Plan:  Hyperlipidemia-intolerant of Lipitor 20 mg daily. Try Crestor 5 mg daily  Hypertension  Stage III chronic kidney disease  Plan: Samples of Crestor 5 mg daily plus prescription for same. Remote she'll have fasting lipid panel liver functions without office visit. Schedule physical examination for spring 2015 approximately March.

## 2012-10-12 NOTE — Patient Instructions (Addendum)
Start Crestor 5 mg daily. Return in 3 months for fasting lipid panel and liver functions without office visit. Return for physical examination March 2015

## 2012-11-05 ENCOUNTER — Other Ambulatory Visit: Payer: Self-pay

## 2012-12-27 ENCOUNTER — Other Ambulatory Visit: Payer: Self-pay

## 2013-01-03 ENCOUNTER — Encounter: Payer: Self-pay | Admitting: Gynecology

## 2013-01-03 ENCOUNTER — Ambulatory Visit (INDEPENDENT_AMBULATORY_CARE_PROVIDER_SITE_OTHER): Payer: Medicare Other | Admitting: Gynecology

## 2013-01-03 VITALS — BP 110/66 | Ht 60.5 in | Wt 147.0 lb

## 2013-01-03 DIAGNOSIS — N393 Stress incontinence (female) (male): Secondary | ICD-10-CM

## 2013-01-03 DIAGNOSIS — Z780 Asymptomatic menopausal state: Secondary | ICD-10-CM

## 2013-01-03 DIAGNOSIS — N952 Postmenopausal atrophic vaginitis: Secondary | ICD-10-CM

## 2013-01-03 DIAGNOSIS — Z23 Encounter for immunization: Secondary | ICD-10-CM

## 2013-01-03 DIAGNOSIS — IMO0002 Reserved for concepts with insufficient information to code with codable children: Secondary | ICD-10-CM

## 2013-01-03 NOTE — Patient Instructions (Signed)
Followup for bone density as scheduled. Followup in one year for annual exam 

## 2013-01-03 NOTE — Addendum Note (Signed)
Addended by: Dayna Barker on: 01/03/2013 03:27 PM   Modules accepted: Orders

## 2013-01-03 NOTE — Progress Notes (Signed)
Charlotte Duarte 08/16/1940 [id number]        72 y.o.  G1P0010 for followup exam.  Former patient of Dr. Eda Paschal. Several issues noted below.  Past medical history,surgical history, problem list, medications, allergies, family history and social history were all reviewed and documented in the EPIC chart.  ROS:  Performed and pertinent positives and negatives are included in the history, assessment and plan .  Exam: Kim assistant Filed Vitals:   01/03/13 1359  BP: 110/66  Height: 5' 0.5" (1.537 m)  Weight: 147 lb (66.679 kg)   General appearance  Normal Skin grossly normal Head/Neck normal with no cervical or supraclavicular adenopathy thyroid normal Lungs  clear Cardiac RR, without RMG Abdominal  soft, nontender, without masses, organomegaly or hernia Breasts  examined lying and sitting without masses, retractions, discharge or axillary adenopathy. Pelvic  Ext/BUS/vagina  normal with atrophic changes  Cervix  normal with atrophic changes  Uterus  anteverted, normal size, shape and contour, midline and mobile nontender   Adnexa  Without masses or tenderness    Anus and perineum  normal   Rectovaginal  normal sphincter tone without palpated masses or tenderness.    Assessment/Plan:  72 y.o. G1P0010 female for followup exam.   1. Postmenopausal/atrophic genital changes. Patient is having some issues with dyspareunia. Had transiently used Estrace vaginal cream but does not want to do this. OTC lubricant options reviewed. Patient will try these and followup as needed. Otherwise doing well without significant hot flashes, night sweats or any vaginal bleeding. Patient knows to report any vaginal bleeding. 2. Stress urinary incontinence. Patient with mild symptoms. Kegel exercises reviewed previously by Dr. Eda Paschal. No surgical intervention desired. 3. Pap smear 2012. No Pap smear done today. No history of abnormal Pap smears previously. Options to stop screening altogether as she is over  the age of 46 versus less frequent screening intervals reviewed. Will readdress on an annual basis. 4. Mammography 12/2011. Patient is in the process of scheduling. SBE monthly reviewed. 5. Colonoscopy 8 years ago with recommended repeat at 10 year interval. 6. DEXA 2005 normal. Recommend repeat DEXA now and she agrees to schedule. Increased calcium vitamin D discussed. 7. Health maintenance. No blood work done as this is done through her primary physician's office. Followup one year, sooner as needed.  Note: This document was prepared with digital dictation and possible smart phrase technology. Any transcriptional errors that result from this process are unintentional.   Dara Lords MD, 2:51 PM 01/03/2013

## 2013-01-09 ENCOUNTER — Other Ambulatory Visit: Payer: Self-pay | Admitting: *Deleted

## 2013-01-09 DIAGNOSIS — I10 Essential (primary) hypertension: Secondary | ICD-10-CM

## 2013-01-09 DIAGNOSIS — M1610 Unilateral primary osteoarthritis, unspecified hip: Secondary | ICD-10-CM

## 2013-01-09 DIAGNOSIS — M169 Osteoarthritis of hip, unspecified: Secondary | ICD-10-CM

## 2013-01-09 DIAGNOSIS — R7302 Impaired glucose tolerance (oral): Secondary | ICD-10-CM

## 2013-01-09 DIAGNOSIS — E785 Hyperlipidemia, unspecified: Secondary | ICD-10-CM

## 2013-01-09 DIAGNOSIS — Z1329 Encounter for screening for other suspected endocrine disorder: Secondary | ICD-10-CM

## 2013-01-09 DIAGNOSIS — N183 Chronic kidney disease, stage 3 unspecified: Secondary | ICD-10-CM

## 2013-01-14 ENCOUNTER — Other Ambulatory Visit: Payer: Medicare Other | Admitting: Internal Medicine

## 2013-01-14 DIAGNOSIS — N183 Chronic kidney disease, stage 3 unspecified: Secondary | ICD-10-CM

## 2013-01-14 DIAGNOSIS — I10 Essential (primary) hypertension: Secondary | ICD-10-CM

## 2013-01-14 DIAGNOSIS — Z79899 Other long term (current) drug therapy: Secondary | ICD-10-CM

## 2013-01-14 DIAGNOSIS — Z8679 Personal history of other diseases of the circulatory system: Secondary | ICD-10-CM

## 2013-01-14 LAB — HEPATIC FUNCTION PANEL
Albumin: 4.4 g/dL (ref 3.5–5.2)
Alkaline Phosphatase: 59 U/L (ref 39–117)
Bilirubin, Direct: 0.1 mg/dL (ref 0.0–0.3)
Indirect Bilirubin: 0.3 mg/dL (ref 0.0–0.9)
Total Bilirubin: 0.4 mg/dL (ref 0.3–1.2)

## 2013-01-14 LAB — LIPID PANEL: LDL Cholesterol: 91 mg/dL (ref 0–99)

## 2013-01-20 ENCOUNTER — Ambulatory Visit (INDEPENDENT_AMBULATORY_CARE_PROVIDER_SITE_OTHER): Payer: Medicare Other | Admitting: Internal Medicine

## 2013-01-20 VITALS — BP 126/74 | HR 78 | Temp 98.3°F | Resp 18 | Ht 61.0 in | Wt 147.0 lb

## 2013-01-20 DIAGNOSIS — R350 Frequency of micturition: Secondary | ICD-10-CM

## 2013-01-20 DIAGNOSIS — N390 Urinary tract infection, site not specified: Secondary | ICD-10-CM

## 2013-01-20 LAB — POCT UA - MICROSCOPIC ONLY
Crystals, Ur, HPF, POC: NEGATIVE
Mucus, UA: NEGATIVE

## 2013-01-20 LAB — POCT URINALYSIS DIPSTICK
Bilirubin, UA: NEGATIVE
Glucose, UA: NEGATIVE
Ketones, UA: NEGATIVE
Nitrite, UA: POSITIVE
Spec Grav, UA: 1.01

## 2013-01-20 MED ORDER — CIPROFLOXACIN HCL 250 MG PO TABS: 250.0000 mg | ORAL_TABLET | Freq: Two times a day (BID) | ORAL | Status: DC

## 2013-01-20 NOTE — Progress Notes (Signed)
Dysuria/pressure/frequency for 2 days No fever,abd pain,flank pain Last uti long ago  Ex- BP 126/74  Pulse 78  Temp(Src) 98.3 F (36.8 C) (Oral)  Resp 18  Ht 5\' 1"  (1.549 m)  Wt 147 lb (66.679 kg)  BMI 27.79 kg/m2  SpO2 98% abd-benign No cva tend to perc  Results for orders placed in visit on 01/20/13  POCT URINALYSIS DIPSTICK      Result Value Range   Color, UA yellow     Clarity, UA cloudy     Glucose, UA neg     Bilirubin, UA neg     Ketones, UA neg     Spec Grav, UA 1.010     Blood, UA large     pH, UA 5.0     Protein, UA 30     Urobilinogen, UA 0.2     Nitrite, UA positive     Leukocytes, UA large (3+)    POCT UA - MICROSCOPIC ONLY      Result Value Range   WBC, Ur, HPF, POC tntc     RBC, urine, microscopic tntc     Bacteria, U Microscopic 1+     Mucus, UA neg     Epithelial cells, urine per micros 0-2     Crystals, Ur, HPF, POC neg     Casts, Ur, LPF, POC renal tubular     Yeast, UA neg     P#1 UTI  Meds ordered this encounter  Medications         . ciprofloxacin (CIPRO) 250 MG tablet    Sig: Take 1 tablet (250 mg total) by mouth 2 (two) times daily.    Dispense:  10 tablet    Refill:  0

## 2013-01-21 ENCOUNTER — Other Ambulatory Visit: Payer: Self-pay | Admitting: *Deleted

## 2013-01-21 MED ORDER — ROSUVASTATIN CALCIUM 5 MG PO TABS: 5.0000 mg | ORAL_TABLET | Freq: Every day | ORAL | Status: DC

## 2013-01-22 ENCOUNTER — Telehealth: Payer: Self-pay | Admitting: Radiology

## 2013-01-22 NOTE — Telephone Encounter (Signed)
Partial response awaiting sens---also needs dental work thurs req clinda Will call back w/ labs

## 2013-01-22 NOTE — Telephone Encounter (Signed)
Patient states not better after the 5 days course of the Cipro, sensitivity pending. Please advise.

## 2013-01-23 LAB — URINE CULTURE: Colony Count: >=100000

## 2013-03-23 NOTE — Progress Notes (Signed)
   Subjective:    Patient ID: Charlotte Duarte, female    DOB: May 27, 1940, 73 y.o.   MRN: [id number]  HPI She has stage III kidney disease and hypertension. 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 She is on statin medication and complaining bitterly of myalgias and arthralgias. Thinks it may be related to statin medication. Likely needs to have statin medication because she has a renal stent. Symptoms have been going on a few weeks now. No known tick bite but is concerned about Lyme disease.      Review of Systems     Objective:   Physical Exam Heberden's and Bouchard's nodes in her hands. No tender joints or red joints. Neck is supple without thyromegaly. Chest clear. Cardiac exam regular rate and rhythm normal S1 and S2. Extremities without edema. No rash.        Assessment & Plan:  Arthralgias and myalgias-patient thinks is related to statin medication  Plan: Rheumatology studies will be done including sedimentation rate, ANA, CCP, rheumatoid factor. Check for Lyme disease and [place] spotted fever She will stop statin medication and return for followup in a few weeks. CBC with differential, C-met TSH drawn as well  Addendum: No evidence of Lyme disease or [facility] spotted fever. ANA negative. CCP negative. Sedimentation rate within normal limits. CBC with differential is normal, TSH is normal. Creatinine stable.  25 minutes spent with patient

## 2013-05-19 ENCOUNTER — Other Ambulatory Visit: Payer: Self-pay | Admitting: Internal Medicine

## 2013-07-01 ENCOUNTER — Ambulatory Visit (INDEPENDENT_AMBULATORY_CARE_PROVIDER_SITE_OTHER): Payer: Medicare Other | Admitting: Family Medicine

## 2013-07-01 VITALS — BP 102/70 | HR 73 | Temp 98.1°F | Resp 16 | Ht 61.0 in | Wt 155.6 lb

## 2013-07-01 DIAGNOSIS — J302 Other seasonal allergic rhinitis: Secondary | ICD-10-CM

## 2013-07-01 DIAGNOSIS — J309 Allergic rhinitis, unspecified: Secondary | ICD-10-CM

## 2013-07-01 DIAGNOSIS — R0981 Nasal congestion: Secondary | ICD-10-CM

## 2013-07-01 DIAGNOSIS — J3489 Other specified disorders of nose and nasal sinuses: Secondary | ICD-10-CM

## 2013-07-01 MED ORDER — MONTELUKAST SODIUM 10 MG PO TABS: 10.0000 mg | ORAL_TABLET | Freq: Every day | ORAL | Status: DC

## 2013-07-01 MED ORDER — FLUTICASONE PROPIONATE 50 MCG/ACT NA SUSP: 2.0000 | Freq: Every day | NASAL | Status: DC

## 2013-07-01 NOTE — Patient Instructions (Signed)

## 2013-07-01 NOTE — Progress Notes (Signed)
Chief Complaint:  Chief Complaint  Patient presents with  . Sinus Problem    x 1 week     HPI: Charlotte Duarte is a 73 y.o. female who is here for  1 week history of sinus sxs, has had pain in he face and teeth, she has no colored in her rhinorrhea dc, she has tried flonase and also netty pot. When she take ibuprofen it helps. Denes fevers or chills/SOB/Wheezing  Past Medical History  Diagnosis Date  . Hyperlipidemia   . Hypertension   . Atrophic vaginitis   . SUI (stress urinary incontinence, female)   . GERD (gastroesophageal reflux disease)   . H/O hiatal hernia   . Arthritis   . 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 Renal artery stenosis   . 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 Carpal tunnel release  1995    right  . Knee arthroscopy      BOTH KNEES  . Cataract extraction  20 yrs ago    both eyes   . Renal stint      RENAL STINT LEFT RENAL ARTERY IN 2005.  RENAL STINT REPLACE 03/2005.  . Bilateral capsulectomy  2008  . Augmentation mammaplasty  1980     both breasts implants REMOVED IN 2005  . 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 Marital Status: Married    Spouse Name: N/A    Number of Children: N/A  . Years of Education: N/A   Social History Main Topics  . Smoking status: Former Smoker    Types: Cigarettes    Quit date: 07/27/1970  . Smokeless tobacco: Never Used  . Alcohol Use: 6.0 oz/week    10 Glasses of wine per week     Comment: 1-2 glasses of wine daily  . Drug Use: No  . Sexual Activity: Yes    Birth Control/ Protection: Post-menopausal   Other Topics Concern  . None   Social History Narrative  . None   Family History  Problem Relation Age of Onset  .  Mental illness Mother   . Breast cancer Mother 81  . Hypertension Mother   . Heart disease Father   . ALS Brother   . Stroke Brother    No Known Allergies Prior to Admission medications   Medication Sig Start Date End Date Taking? Authorizing Provider  aspirin 81 MG tablet Take 81 mg by mouth daily.   Yes Historical Provider, MD  benazepril (LOTENSIN) 10 MG tablet Take 1 tablet (10 mg total) by mouth daily with  breakfast.   Yes Elby Showers, MD  calcium carbonate (TUMS - DOSED IN MG ELEMENTAL CALCIUM) 500 MG chewable tablet Chew 1 tablet by mouth as needed. Prn reflux   Yes Historical Provider, MD  clopidogrel (PLAVIX) 75 MG tablet Take 1 tablet by mouth  daily with breakfast   Yes Elby Showers, MD  fexofenadine (ALLEGRA) 180 MG tablet Take 180 mg by mouth every morning.    Yes Historical Provider, MD  fluticasone (FLONASE) 50 MCG/ACT nasal spray Place 2 sprays into the nose as needed. 09/08/11  Yes Elby Showers, MD  omeprazole (PRILOSEC) 20 MG capsule Take  20 mg by mouth as needed. Prn only   Yes Historical Provider, MD  rosuvastatin (CRESTOR) 5 MG tablet Take 1 tablet (5 mg total) by mouth daily. 01/21/13  Yes Elby Showers, MD  triamterene-hydrochlorothiazide (MAXZIDE) 75-50 MG per tablet Take 1 tablet by mouth  daily with breakfast   Yes Elby Showers, MD     ROS: The patient denies fevers, chills, night sweats, unintentional weight loss, chest pain, palpitations, wheezing, dyspnea on exertion, nausea, vomiting, abdominal pain, dysuria, hematuria, melena, numbness, weakness, or tingling.   All other systems have been reviewed and were otherwise negative with the exception of those mentioned in the HPI and as above.    PHYSICAL EXAM: Filed Vitals:   07/01/13 1523  BP: 102/70  Pulse: 73  Temp: 98.1 F (36.7 C)  Resp: 16   Filed Vitals:   07/01/13 1523  Height: 5\' 1"  (1.549 m)  Weight: 155 lb 9.6 oz (70.58 kg)   Body mass index is 29.42 kg/(m^2).  General: Alert, no acute  distress HEENT:  Normocephalic, atraumatic, oropharynx patent. EOMI, PERRLA. TM nl, min sinus tenderness, boggy nares, no exudates Cardiovascular:  Regular rate and rhythm, no rubs murmurs or gallops.  No Carotid bruits, radial pulse intact. No pedal edema.  Respiratory: Clear to auscultation bilaterally.  No wheezes, rales, or rhonchi.  No cyanosis, no use of accessory musculature GI: No organomegaly, abdomen is soft and non-tender, positive bowel sounds.  No masses. Skin: No rashes. Neurologic: Facial musculature symmetric. Psychiatric: Patient is appropriate throughout our interaction. Lymphatic: No cervical lymphadenopathy Musculoskeletal: Gait intact.   LABS: Results for orders placed in visit on 01/20/13  URINE CULTURE      Result Value Ref Range   Culture ESCHERICHIA COLI     Colony Count >=100,000 COLONIES/ML     Organism ID, Bacteria ESCHERICHIA COLI    POCT URINALYSIS DIPSTICK      Result Value Ref Range   Color, UA yellow     Clarity, UA cloudy     Glucose, UA neg     Bilirubin, UA neg     Ketones, UA neg     Spec Grav, UA 1.010     Blood, UA large     pH, UA 5.0     Protein, UA 30     Urobilinogen, UA 0.2     Nitrite, UA positive     Leukocytes, UA large (3+)    POCT UA - MICROSCOPIC ONLY      Result Value Ref Range   WBC, Ur, HPF, POC tntc     RBC, urine, microscopic tntc     Bacteria, U Microscopic 1+     Mucus, UA neg     Epithelial cells, urine per micros 0-2     Crystals, Ur, HPF, POC neg     Casts, Ur, LPF, POC renal tubular     Yeast, UA neg       EKG/XRAY:   Primary read interpreted by Dr. Marin Comment at [facility].   ASSESSMENT/PLAN: Encounter Diagnoses  Name Primary?  . Sinus congestion Yes  . Seasonal allergies    Continue with allegra andflonase Use flonase more regular during peak olen season Add on singulair at night time for better allergy control If no improvement or worsening sxs please call or f/u in office  Gross sideeffects, risk and  benefits, and alternatives of medications d/w patient. Patient is aware that all medications have potential sideeffects and we are unable to predict every sideeffect  or drug-drug interaction that may occur.  Glenford Bayley, DO 07/02/2013 2:36 PM

## 2013-08-06 ENCOUNTER — Ambulatory Visit (INDEPENDENT_AMBULATORY_CARE_PROVIDER_SITE_OTHER): Payer: Medicare Other | Admitting: Internal Medicine

## 2013-08-06 VITALS — BP 122/78 | HR 71 | Temp 97.5°F | Resp 16 | Ht 61.5 in | Wt 155.0 lb

## 2013-08-06 DIAGNOSIS — N390 Urinary tract infection, site not specified: Secondary | ICD-10-CM

## 2013-08-06 DIAGNOSIS — R300 Dysuria: Secondary | ICD-10-CM

## 2013-08-06 LAB — POCT URINALYSIS DIPSTICK
Bilirubin, UA: NEGATIVE
GLUCOSE UA: NEGATIVE
KETONES UA: NEGATIVE
NITRITE UA: NEGATIVE
Protein, UA: NEGATIVE
Spec Grav, UA: <=1.005
Urobilinogen, UA: 0.2
pH, UA: 6.5

## 2013-08-06 LAB — POCT UA - MICROSCOPIC ONLY
Bacteria, U Microscopic: NEGATIVE
Casts, Ur, LPF, POC: NEGATIVE
Crystals, Ur, HPF, POC: NEGATIVE
Mucus, UA: NEGATIVE
YEAST UA: NEGATIVE

## 2013-08-06 MED ORDER — CIPROFLOXACIN HCL 500 MG PO TABS: 500.0000 mg | ORAL_TABLET | Freq: Two times a day (BID) | ORAL | Status: DC

## 2013-08-06 MED ORDER — PHENAZOPYRIDINE HCL 200 MG PO TABS: 200.0000 mg | ORAL_TABLET | Freq: Three times a day (TID) | ORAL | Status: DC | PRN

## 2013-08-06 NOTE — Progress Notes (Signed)
   Subjective:    Patient ID: Charlotte Duarte, female    DOB: 1940-06-13, 73 y.o.   MRN: [id number]  HPI  Uti: Urinary retention x 1 day,  Not eliminating as much as she feels she could. Not having a feeling of fulness after urinating.  Drinking plenty of fluids Has taken a couple of azo tablets last night. They did help.  Denies burning with urination, no abdominal pain, no fevers Denies diarrhea  Review of Systems     Objective:   Physical Exam  Constitutional: She is oriented to person, place, and time. She appears well-developed and well-nourished.  HENT:  Head: Normocephalic.  Eyes: EOM are normal.  Pulmonary/Chest: Effort normal.  Abdominal: There is no tenderness.  Neurological: She is alert and oriented to person, place, and time. She exhibits normal muscle tone. Coordination normal.  Psychiatric: She has a normal mood and affect. Her behavior is normal.          Assessment & Plan:  UTI Cipro 500mg  bid/Pyridium prn

## 2013-08-06 NOTE — Progress Notes (Signed)
   Subjective:    Patient ID: Charlotte Duarte, female    DOB: June 18, 1940, 73 y.o.   MRN: [id number]  HPI    Review of Systems     Objective:   Physical Exam        Assessment & Plan:

## 2013-08-09 ENCOUNTER — Telehealth: Payer: Self-pay | Admitting: *Deleted

## 2013-08-09 LAB — URINE CULTURE

## 2013-08-09 MED ORDER — AMOXICILLIN-POT CLAVULANATE 875-125 MG PO TABS: 1.0000 | ORAL_TABLET | Freq: Two times a day (BID) | ORAL | Status: DC

## 2013-08-09 MED ORDER — FLUCONAZOLE 150 MG PO TABS: 150.0000 mg | ORAL_TABLET | Freq: Once | ORAL | Status: DC

## 2013-08-09 NOTE — Telephone Encounter (Signed)
Per Dr. Elder Cyphers sent in Augmentin. Tried to call pt. Unable to reach her, will try again later

## 2013-08-09 NOTE — Telephone Encounter (Signed)
Charlotte Duarte spoke to pt and let her know that we were calling in Augmentin (see lab msg). Pt says she is also having vaginal itching. Per Dr. Elder Cyphers, ok to call in [place].

## 2013-08-10 ENCOUNTER — Telehealth: Payer: Self-pay

## 2013-08-10 MED ORDER — SULFAMETHOXAZOLE-TMP DS 800-160 MG PO TABS: 1.0000 | ORAL_TABLET | Freq: Two times a day (BID) | ORAL | Status: DC

## 2013-08-10 NOTE — Telephone Encounter (Signed)
Pt was called in augmentin and she took it last night.  She became very sick and threw up several times.  She feels queasy this morning and doesn't think she can take this medicine.  She is going on a week long trip a week from now and wants to get this taken care of.  The trip is a motorcycle ride to [place], so she says a 10-day regimen will be difficult.  [phone]

## 2013-08-10 NOTE — Telephone Encounter (Signed)
Will switch her to Septra which I have sent to the pharmacy.  This bacteria is resistance to most abx which is concerning - so we definitely want to recheck her urine in 2 weeks - Please make sure she does not have fever and worsening symptoms that cause her to need an OV today to make sure she does not have a kidney infection.

## 2013-08-10 NOTE — Telephone Encounter (Signed)
Pt has been taking with food, no diarrhea. She also feels like she has bad indigestion.  Can we change to something else and what can she get for the indigestion?

## 2013-08-11 NOTE — Telephone Encounter (Signed)
Pt notified and to come in, in 2 weeks

## 2013-08-11 NOTE — Telephone Encounter (Signed)
LMOM TO CB 

## 2013-08-12 ENCOUNTER — Encounter: Payer: Self-pay | Admitting: *Deleted

## 2013-08-28 ENCOUNTER — Ambulatory Visit (INDEPENDENT_AMBULATORY_CARE_PROVIDER_SITE_OTHER): Payer: Medicare Other | Admitting: Physician Assistant

## 2013-08-28 DIAGNOSIS — N390 Urinary tract infection, site not specified: Secondary | ICD-10-CM

## 2013-08-28 LAB — POCT URINALYSIS DIPSTICK
Bilirubin, UA: NEGATIVE
Blood, UA: NEGATIVE
Glucose, UA: NEGATIVE
Ketones, UA: NEGATIVE
Leukocytes, UA: NEGATIVE
Nitrite, UA: NEGATIVE
Protein, UA: NEGATIVE
Spec Grav, UA: <=1.005
Urobilinogen, UA: 0.2
pH, UA: 5

## 2013-08-28 LAB — POCT UA - MICROSCOPIC ONLY
Bacteria, U Microscopic: NEGATIVE
Casts, Ur, LPF, POC: NEGATIVE
Crystals, Ur, HPF, POC: NEGATIVE
Mucus, UA: NEGATIVE
Yeast, UA: NEGATIVE

## 2013-08-28 NOTE — Progress Notes (Signed)
Patient here for repeat UA only s/p UTI. Patient is asymptomatic and UA is clear. F/u as needed. Not seen by a provider today.    Results for orders placed in visit on 08/28/13  POCT URINALYSIS DIPSTICK      Result Value Ref Range   Color, UA yellow     Clarity, UA clear     Glucose, UA neg     Bilirubin, UA neg     Ketones, UA neg     Spec Grav, UA <=1.005     Blood, UA neg     pH, UA 5.0     Protein, UA neg     Urobilinogen, UA 0.2     Nitrite, UA neg     Leukocytes, UA Negative    POCT UA - MICROSCOPIC ONLY      Result Value Ref Range   WBC, Ur, HPF, POC 0-1     RBC, urine, microscopic 0-1     Bacteria, U Microscopic neg     Mucus, UA neg     Epithelial cells, urine per micros 0-1     Crystals, Ur, HPF, POC neg     Casts, Ur, LPF, POC neg     Yeast, UA neg

## 2013-08-28 NOTE — Progress Notes (Signed)
Called pt to notify her the results of the urinalysis were good. She is not having any symptoms at this time. She was grateful for the call.

## 2013-09-02 ENCOUNTER — Other Ambulatory Visit: Payer: Self-pay | Admitting: Internal Medicine

## 2013-09-02 ENCOUNTER — Telehealth: Payer: Self-pay | Admitting: Internal Medicine

## 2013-09-02 ENCOUNTER — Other Ambulatory Visit: Payer: Self-pay

## 2013-09-02 MED ORDER — ROSUVASTATIN CALCIUM 5 MG PO TABS: 5.0000 mg | ORAL_TABLET | Freq: Every day | ORAL | Status: DC

## 2013-09-02 NOTE — Telephone Encounter (Signed)
Can you handle this now?

## 2013-09-02 NOTE — Telephone Encounter (Signed)
Pt gets her Crestor 5 mg tablet through [facility] (90 days), it was refilled today, however she needs approximately 10 tablets (enough to get by until her mail order comes in).  Pharmacy of choice is [facility] [place].  [phone] # to reach patient

## 2013-09-02 NOTE — Telephone Encounter (Signed)
Patient informed. 

## 2013-10-30 ENCOUNTER — Other Ambulatory Visit: Payer: Self-pay | Admitting: Internal Medicine

## 2014-02-24 ENCOUNTER — Ambulatory Visit (INDEPENDENT_AMBULATORY_CARE_PROVIDER_SITE_OTHER): Payer: Medicare Other | Admitting: Internal Medicine

## 2014-02-24 ENCOUNTER — Encounter: Payer: Self-pay | Admitting: Internal Medicine

## 2014-02-24 VITALS — BP 112/64 | HR 73 | Temp 98.1°F | Wt 149.0 lb

## 2014-02-24 DIAGNOSIS — J069 Acute upper respiratory infection, unspecified: Secondary | ICD-10-CM | POA: Diagnosis not present

## 2014-02-24 MED ORDER — HYDROCOD POLST-CHLORPHEN POLST 10-8 MG/5ML PO LQCR: 5.0000 mL | Freq: Two times a day (BID) | ORAL | Status: DC | PRN

## 2014-02-24 MED ORDER — AZITHROMYCIN 250 MG PO TABS: ORAL_TABLET | ORAL | Status: DC

## 2014-02-24 NOTE — Patient Instructions (Addendum)
Take  Zithromax as directed and Tussionex as directed for cough. Call if not better in 7-10 days or sooner if for this

## 2014-02-24 NOTE — Progress Notes (Signed)
   Subjective:    Patient ID: Charlotte Duarte, female    DOB: 12-31-1940, 74 y.o.   MRN: [id number]  HPI  10 day history of cough and upper respiratory infection symptoms. Now beginning to bring up some discolored sputum. Has run out of cough syrup that she had at home which belonged to her husband. She sounds hoarse when she speaks. No fever or shaking chills. No sore throat.    Review of Systems     Objective:   Physical Exam  Skin warm and dry. Anterior cervical nodes bilaterally. TMs are clear. Pharynx is clear. Neck is supple. Chest clear to auscultation without rales or wheezing      Assessment & Plan:  Acute URI  Plan: Tussirex 1 teaspoon by mouth every 12 hours when necessary cough. Zithromax Z-PAK take 2 tablets day one followed by 1 tablet days 2 through 5. Rest and drink plenty of fluids.

## 2014-03-16 ENCOUNTER — Other Ambulatory Visit: Payer: Self-pay | Admitting: Internal Medicine

## 2014-03-20 ENCOUNTER — Ambulatory Visit (INDEPENDENT_AMBULATORY_CARE_PROVIDER_SITE_OTHER): Payer: Medicare Other | Admitting: Internal Medicine

## 2014-03-20 ENCOUNTER — Encounter: Payer: Self-pay | Admitting: Internal Medicine

## 2014-03-20 VITALS — BP 138/78 | HR 76 | Temp 97.8°F | Wt 147.0 lb

## 2014-03-20 DIAGNOSIS — J320 Chronic maxillary sinusitis: Secondary | ICD-10-CM

## 2014-03-20 DIAGNOSIS — J069 Acute upper respiratory infection, unspecified: Secondary | ICD-10-CM

## 2014-03-20 MED ORDER — LEVOFLOXACIN 500 MG PO TABS: 500.0000 mg | ORAL_TABLET | Freq: Every day | ORAL | Status: DC

## 2014-03-20 NOTE — Progress Notes (Signed)
   Subjective:    Patient ID: Charlotte Duarte, female    DOB: 1940-08-17, 74 y.o.   MRN: [id number]  HPI She was here January 4 with acute upper respiratory infection. Was treated with Zithromax Z-Pak. However never got completely well. She's concerned about it. Has had a lot of fatigue since that time. Has left maxillary sinus tenderness. In the mornings has discolored sputum production. No fever or shaking chills.    Review of Systems     Objective:   Physical Exam  Skin warm and dry. Nodes none. TMs are clear. Neck is supple without adenopathy. Chest completely clear to auscultation without rales or wheezing      Assessment & Plan:  Left maxillary sinusitis  Protracted upper respiratory infection  Plan: Levaquin 500 milligrams daily for 7 days-- take with a meal

## 2014-03-20 NOTE — Patient Instructions (Addendum)
Take Levaquin 500 mg daily x 7 days. Call if not better in 10 days or sooner if worse.

## 2014-05-01 ENCOUNTER — Encounter: Payer: Self-pay | Admitting: Internal Medicine

## 2014-05-01 ENCOUNTER — Ambulatory Visit (INDEPENDENT_AMBULATORY_CARE_PROVIDER_SITE_OTHER): Payer: Medicare Other | Admitting: Internal Medicine

## 2014-05-01 ENCOUNTER — Telehealth: Payer: Self-pay | Admitting: *Deleted

## 2014-05-01 VITALS — BP 108/70 | HR 89 | Temp 100.8°F | Wt 148.0 lb

## 2014-05-01 DIAGNOSIS — R509 Fever, unspecified: Secondary | ICD-10-CM | POA: Diagnosis not present

## 2014-05-01 LAB — INFLUENZA A AND B
INFLUENZA B AG: NEGATIVE
Inflenza A Ag: POSITIVE — AB

## 2014-05-01 MED ORDER — OSELTAMIVIR PHOSPHATE 30 MG PO CAPS: 30.0000 mg | ORAL_CAPSULE | Freq: Two times a day (BID) | ORAL | Status: DC

## 2014-05-01 NOTE — Progress Notes (Signed)
   Subjective:    Patient ID: Charlotte Duarte, female    DOB: 10-15-1940, 74 y.o.   MRN: [id number]  HPI 48 hours ago patient had onset of sudden malaise and fatigue. She had some midsternal chest discomfort but no radiation of pain. Began to cough. Subsequently developed fever and myalgias. Did not take flu vaccine this season. History of chronic kidney disease stage III. Says upper respiratory congestion is aggravating and the cough is also aggravating. Started on some Levaquin that she had left over from a previous prescription.    Review of Systems     Objective:   Physical Exam  Moving slowly from chair to exam table. Looks in some distress. Says she does not feel well. Not tachypnea. Has fever. Rapid influenza test is positive for influenza A pharynx slightly injected. TMs slightly full. Neck supple. Chest clear to auscultation      Assessment & Plan:  Influenza  Chronic kidney disease  Hypertension  Hyperlipidemia  Plan: Tamiflu 30 mg twice daily for 5 days. Dose decreased because of history of decreased GFR. Has Tussionex for cough. Continue course of Levaquin previously prescribed. May take DayQuil for daytime congestion for 3-5 days only. Monitor blood pressure at home.

## 2014-05-01 NOTE — Telephone Encounter (Signed)
Spoke with patient reviewed influenza positive results instructions given for Tamiflu administration.

## 2014-05-01 NOTE — Patient Instructions (Signed)
Take Tamiflu 30 milligrams twice daily for 5 days. Take Tussionex for cough. Take Levaquin as directed. DayQuil as needed for upper respiratory congestion for 3-5 days. Stay at home and rest. Drink plenty of fluids.

## 2014-05-12 ENCOUNTER — Encounter: Payer: Self-pay | Admitting: Internal Medicine

## 2014-05-12 ENCOUNTER — Ambulatory Visit (INDEPENDENT_AMBULATORY_CARE_PROVIDER_SITE_OTHER): Payer: Medicare Other | Admitting: Internal Medicine

## 2014-05-12 VITALS — BP 112/70 | HR 71 | Temp 97.4°F | Wt 148.0 lb

## 2014-05-12 DIAGNOSIS — N390 Urinary tract infection, site not specified: Secondary | ICD-10-CM | POA: Diagnosis not present

## 2014-05-12 DIAGNOSIS — R8290 Unspecified abnormal findings in urine: Secondary | ICD-10-CM

## 2014-05-12 DIAGNOSIS — R109 Unspecified abdominal pain: Secondary | ICD-10-CM | POA: Diagnosis not present

## 2014-05-12 LAB — POCT URINALYSIS DIPSTICK
BILIRUBIN UA: NEGATIVE
Glucose, UA: NEGATIVE
Ketones, UA: NEGATIVE
Nitrite, UA: NEGATIVE
PH UA: 6
Spec Grav, UA: 1.015
Urobilinogen, UA: NEGATIVE

## 2014-05-12 MED ORDER — CIPROFLOXACIN HCL 250 MG PO TABS: 250.0000 mg | ORAL_TABLET | Freq: Two times a day (BID) | ORAL | Status: DC

## 2014-05-12 NOTE — Progress Notes (Signed)
   Subjective:    Patient ID: Charlotte Duarte, female    DOB: 08-02-1940, 74 y.o.   MRN: [id number]  HPI  74 year old Female with stage III chronic kidney disease in today with UTI symptoms for the past 2 days. Has a lot of bladder pressure but not much dysuria. No fever or shaking chills. Has some low back pain.    Review of Systems     Objective:   Physical Exam  No CVA tenderness. Dipstick urine: Abnormal  Results with 2+ LE. Culture sent.    Assessment & Plan:  Acute UTI  Plan: Cipro 250 mg twice daily for 7 days.  Recently seen here and treated for influenza and respiratory infection. See previous note.

## 2014-05-12 NOTE — Patient Instructions (Signed)
Urine culture pending. Take Cipro 250 mg twice daily for 7 days

## 2014-05-13 ENCOUNTER — Telehealth: Payer: Self-pay | Admitting: Internal Medicine

## 2014-05-13 NOTE — Telephone Encounter (Signed)
Culture is not back. Continue medication and call back in am

## 2014-05-13 NOTE — Telephone Encounter (Signed)
Patient given instructions to continue current medications we will notify patient when we receive culture results.

## 2014-05-13 NOTE — Telephone Encounter (Signed)
Pt stated she is not having good response with the antibiotic prescribed ciprofloxacin (CIPRO) 250 MG tablet .  When asked what had changed, she stated nothing.  Best number to call is [phone]

## 2014-05-15 ENCOUNTER — Telehealth: Payer: Self-pay | Admitting: *Deleted

## 2014-05-15 ENCOUNTER — Encounter: Payer: Self-pay | Admitting: Internal Medicine

## 2014-05-15 ENCOUNTER — Ambulatory Visit (INDEPENDENT_AMBULATORY_CARE_PROVIDER_SITE_OTHER): Payer: Medicare Other | Admitting: Internal Medicine

## 2014-05-15 ENCOUNTER — Telehealth: Payer: Self-pay | Admitting: Internal Medicine

## 2014-05-15 VITALS — BP 110/70 | HR 68 | Temp 97.8°F

## 2014-05-15 DIAGNOSIS — N390 Urinary tract infection, site not specified: Secondary | ICD-10-CM

## 2014-05-15 LAB — POCT URINALYSIS DIPSTICK
BILIRUBIN UA: NEGATIVE
Glucose, UA: NEGATIVE
Ketones, UA: NEGATIVE
NITRITE UA: NEGATIVE
PH UA: 6
PROTEIN UA: 1
Spec Grav, UA: 1.015
Urobilinogen, UA: NEGATIVE

## 2014-05-15 LAB — URINE CULTURE

## 2014-05-15 MED ORDER — CEFTRIAXONE SODIUM 1 G IJ SOLR
1.0000 g | Freq: Once | INTRAMUSCULAR | Status: AC
  Administered 2014-05-15: 1 g via INTRAMUSCULAR

## 2014-05-15 MED ORDER — CEPHALEXIN 250 MG PO CAPS: 250.0000 mg | ORAL_CAPSULE | Freq: Four times a day (QID) | ORAL | Status: DC

## 2014-05-15 NOTE — Telephone Encounter (Signed)
Called lab to inquire about results of urine culture they states they were just finishing it up and would send it over when resulted

## 2014-05-15 NOTE — Progress Notes (Signed)
   Subjective:    Patient ID: Charlotte Duarte, female    DOB: 03/26/1940, 74 y.o.   MRN: [id number]  HPI  Called so patient and she was not feeling better. Urine culture shows Cipro resistant strain of Escherichia coli. Still has bladder pressure. No fever or shaking chills.    Review of Systems     Objective:   Physical Exam  Urinalysis abnormal with 2+ LE. No CVA tenderness      Assessment & Plan:  UTI resistant to Cipro  Plan: 1 g IM Rocephin Keflex 250 mg as 4 times daily for 7 days with repeat urine specimen in 10 days.

## 2014-05-15 NOTE — Patient Instructions (Signed)
1 g IM Rocephin given. Take Keflex 250 mg 4 times daily for 7 days with repeat urine specimen to be obtained in 10 days.

## 2014-05-15 NOTE — Telephone Encounter (Signed)
Patient called to inquire about results of urine culture no report yet will follow up with lab . Patient states no relief in symptoms yet .

## 2014-05-15 NOTE — Telephone Encounter (Signed)
She has greater than 100,000 colonies per milliliter Escherichia coli which is resistant to Cipro which we started her on March 21. Changed to Keflex 250 mg 4 times daily for 7 days. Recheck urine in 10 days.

## 2014-05-15 NOTE — Telephone Encounter (Signed)
Spoke with patient regarding Preliminary results on urine culture patient advised to continue Cipro until results finalized we will call her with results and any medication changes

## 2014-05-27 ENCOUNTER — Other Ambulatory Visit (INDEPENDENT_AMBULATORY_CARE_PROVIDER_SITE_OTHER): Payer: Medicare Other | Admitting: Internal Medicine

## 2014-05-27 DIAGNOSIS — Z87440 Personal history of urinary (tract) infections: Secondary | ICD-10-CM

## 2014-05-27 LAB — POCT URINALYSIS DIPSTICK
Bilirubin, UA: NEGATIVE
Blood, UA: NEGATIVE
Glucose, UA: NEGATIVE
Ketones, UA: NEGATIVE
LEUKOCYTES UA: NEGATIVE
NITRITE UA: NEGATIVE
PH UA: 6.5
Protein, UA: NEGATIVE
Spec Grav, UA: 1.01
UROBILINOGEN UA: NEGATIVE

## 2014-06-03 DIAGNOSIS — Z803 Family history of malignant neoplasm of breast: Secondary | ICD-10-CM | POA: Diagnosis not present

## 2014-06-03 DIAGNOSIS — Z1231 Encounter for screening mammogram for malignant neoplasm of breast: Secondary | ICD-10-CM | POA: Diagnosis not present

## 2014-06-09 ENCOUNTER — Telehealth: Payer: Self-pay | Admitting: Internal Medicine

## 2014-06-09 DIAGNOSIS — I701 Atherosclerosis of renal artery: Secondary | ICD-10-CM | POA: Diagnosis not present

## 2014-06-09 DIAGNOSIS — I129 Hypertensive chronic kidney disease with stage 1 through stage 4 chronic kidney disease, or unspecified chronic kidney disease: Secondary | ICD-10-CM | POA: Diagnosis not present

## 2014-06-09 DIAGNOSIS — E785 Hyperlipidemia, unspecified: Secondary | ICD-10-CM | POA: Diagnosis not present

## 2014-06-09 DIAGNOSIS — N189 Chronic kidney disease, unspecified: Secondary | ICD-10-CM | POA: Diagnosis not present

## 2014-06-09 NOTE — Telephone Encounter (Signed)
Notified patient crestor is not scored and cannot be cut in half

## 2014-06-09 NOTE — Telephone Encounter (Signed)
Crestor should not be cut.

## 2014-06-09 NOTE — Telephone Encounter (Signed)
Patient wants to know if you would be willing to write a Rx for 10 mg Crestor so that she can cut them in 1/2 due to the Rx being so expensive.  She would need a new Rx.  She takes 5 mg and wants to cut them in 1/2 to save some $.   She gets 90 day subscriptions.    Pharmacy:  [facility] (mail order).

## 2014-07-07 ENCOUNTER — Telehealth: Payer: Self-pay | Admitting: Internal Medicine

## 2014-07-07 MED ORDER — OMEPRAZOLE 20 MG PO CPDR: 20.0000 mg | DELAYED_RELEASE_CAPSULE | Freq: Every day | ORAL | Status: DC

## 2014-07-07 NOTE — Telephone Encounter (Signed)
Omeprazole script sent to pharmacy

## 2014-07-07 NOTE — Telephone Encounter (Signed)
Call in Omeprazole 20 mg daily with refills.

## 2014-07-07 NOTE — Telephone Encounter (Signed)
Patient is calling asking for a prescription to be called to her pharmacy for Omeprazole 20mg .  She has tried OTC Prilosec and states that it doesn't work near as well for her.  She would like the prescription strength not OTC please.    [facility] [place]

## 2014-07-14 DIAGNOSIS — Z124 Encounter for screening for malignant neoplasm of cervix: Secondary | ICD-10-CM | POA: Diagnosis not present

## 2014-07-15 ENCOUNTER — Encounter: Payer: Self-pay | Admitting: Internal Medicine

## 2014-09-04 DIAGNOSIS — M25552 Pain in left hip: Secondary | ICD-10-CM | POA: Diagnosis not present

## 2014-09-04 DIAGNOSIS — M7062 Trochanteric bursitis, left hip: Secondary | ICD-10-CM | POA: Diagnosis not present

## 2014-10-23 DIAGNOSIS — M9903 Segmental and somatic dysfunction of lumbar region: Secondary | ICD-10-CM | POA: Diagnosis not present

## 2014-10-23 DIAGNOSIS — M6289 Other specified disorders of muscle: Secondary | ICD-10-CM | POA: Diagnosis not present

## 2014-10-23 DIAGNOSIS — M545 Low back pain: Secondary | ICD-10-CM | POA: Diagnosis not present

## 2014-10-24 DIAGNOSIS — M9903 Segmental and somatic dysfunction of lumbar region: Secondary | ICD-10-CM | POA: Diagnosis not present

## 2014-10-24 DIAGNOSIS — M545 Low back pain: Secondary | ICD-10-CM | POA: Diagnosis not present

## 2014-10-24 DIAGNOSIS — M6289 Other specified disorders of muscle: Secondary | ICD-10-CM | POA: Diagnosis not present

## 2014-10-26 ENCOUNTER — Other Ambulatory Visit: Payer: Self-pay | Admitting: Internal Medicine

## 2014-10-26 MED ORDER — PREDNISONE 20 MG PO TABS: ORAL_TABLET | ORAL | Status: DC

## 2014-10-26 NOTE — Progress Notes (Signed)
IT band syndrome--[facility] ordered this encounter  Medications  . predniSONE (DELTASONE) 20 MG tablet    Sig: 3/3/2/2/1/1 single daily dose for 6 days    Dispense:  12 tablet    Refill:  0   Addendum 12/14/2014 She has failed physical therapy with [facility] Now much worse and pain interfering with activity There is a question that this may be a lumbosacral radiculopathy and she will be referred to orthopedics Meds ordered this encounter  Medications  . DISCONTD: predniSONE (DELTASONE) 20 MG tablet    Sig: 3/3/2/2/1/1 single daily dose for 6 days    Dispense:  12 tablet    Refill:  0  . predniSONE (DELTASONE) 20 MG tablet    Sig: 4/3/3/2/2/1/1 single daily dose for 7 days    Dispense:  16 tablet    Refill:  0  . cyclobenzaprine (FLEXERIL) 5 MG tablet    Sig: Take 1 tablet (5 mg total) by mouth 3 (three) times daily as needed for muscle spasms. May use 2 at hs.    Dispense:  30 tablet    Refill:  1

## 2014-11-10 ENCOUNTER — Other Ambulatory Visit: Payer: Self-pay | Admitting: Internal Medicine

## 2014-12-08 DIAGNOSIS — S76011D Strain of muscle, fascia and tendon of right hip, subsequent encounter: Secondary | ICD-10-CM | POA: Diagnosis not present

## 2014-12-08 DIAGNOSIS — M7061 Trochanteric bursitis, right hip: Secondary | ICD-10-CM | POA: Diagnosis not present

## 2014-12-08 DIAGNOSIS — Z96641 Presence of right artificial hip joint: Secondary | ICD-10-CM | POA: Diagnosis not present

## 2014-12-08 DIAGNOSIS — S76811D Strain of other specified muscles, fascia and tendons at thigh level, right thigh, subsequent encounter: Secondary | ICD-10-CM | POA: Diagnosis not present

## 2014-12-10 DIAGNOSIS — Z96641 Presence of right artificial hip joint: Secondary | ICD-10-CM | POA: Diagnosis not present

## 2014-12-10 DIAGNOSIS — M7061 Trochanteric bursitis, right hip: Secondary | ICD-10-CM | POA: Diagnosis not present

## 2014-12-10 DIAGNOSIS — S76811D Strain of other specified muscles, fascia and tendons at thigh level, right thigh, subsequent encounter: Secondary | ICD-10-CM | POA: Diagnosis not present

## 2014-12-10 DIAGNOSIS — S76011D Strain of muscle, fascia and tendon of right hip, subsequent encounter: Secondary | ICD-10-CM | POA: Diagnosis not present

## 2014-12-14 MED ORDER — PREDNISONE 20 MG PO TABS: ORAL_TABLET | ORAL | Status: DC

## 2014-12-14 MED ORDER — CYCLOBENZAPRINE HCL 5 MG PO TABS: 5.0000 mg | ORAL_TABLET | Freq: Three times a day (TID) | ORAL | Status: DC | PRN

## 2014-12-15 ENCOUNTER — Other Ambulatory Visit: Payer: Medicare Other | Admitting: Internal Medicine

## 2014-12-15 DIAGNOSIS — Z0000 Encounter for general adult medical examination without abnormal findings: Secondary | ICD-10-CM

## 2014-12-15 DIAGNOSIS — Z1329 Encounter for screening for other suspected endocrine disorder: Secondary | ICD-10-CM | POA: Diagnosis not present

## 2014-12-15 DIAGNOSIS — N183 Chronic kidney disease, stage 3 unspecified: Secondary | ICD-10-CM

## 2014-12-15 DIAGNOSIS — M169 Osteoarthritis of hip, unspecified: Secondary | ICD-10-CM

## 2014-12-15 DIAGNOSIS — E785 Hyperlipidemia, unspecified: Secondary | ICD-10-CM | POA: Diagnosis not present

## 2014-12-15 DIAGNOSIS — I10 Essential (primary) hypertension: Secondary | ICD-10-CM | POA: Diagnosis not present

## 2014-12-15 LAB — TSH: TSH: 0.824 u[IU]/mL (ref 0.350–4.500)

## 2014-12-15 LAB — CBC WITH DIFFERENTIAL/PLATELET
BASOS ABS: 0 10*3/uL (ref 0.0–0.1)
Basophils Relative: 0 % (ref 0–1)
EOS ABS: 0 10*3/uL (ref 0.0–0.7)
Eosinophils Relative: 0 % (ref 0–5)
HEMATOCRIT: 37.4 % (ref 36.0–46.0)
HEMOGLOBIN: 12.8 g/dL (ref 12.0–15.0)
LYMPHS ABS: 1.8 10*3/uL (ref 0.7–4.0)
LYMPHS PCT: 15 % (ref 12–46)
MCH: 29 pg (ref 26.0–34.0)
MCHC: 34.2 g/dL (ref 30.0–36.0)
MCV: 84.6 fL (ref 78.0–100.0)
MONOS PCT: 8 % (ref 3–12)
MPV: 8.8 fL (ref 8.6–12.4)
Monocytes Absolute: 0.9 10*3/uL (ref 0.1–1.0)
NEUTROS ABS: 9 10*3/uL — AB (ref 1.7–7.7)
NEUTROS PCT: 77 % (ref 43–77)
PLATELETS: 293 10*3/uL (ref 150–400)
RBC: 4.42 MIL/uL (ref 3.87–5.11)
RDW: 14.9 % (ref 11.5–15.5)
WBC: 11.7 10*3/uL — ABNORMAL HIGH (ref 4.0–10.5)

## 2014-12-15 LAB — COMPLETE METABOLIC PANEL WITH GFR
ALBUMIN: 4.4 g/dL (ref 3.6–5.1)
ALK PHOS: 65 U/L (ref 33–130)
ALT: 20 U/L (ref 6–29)
AST: 22 U/L (ref 10–35)
BILIRUBIN TOTAL: 0.4 mg/dL (ref 0.2–1.2)
BUN: 35 mg/dL — ABNORMAL HIGH (ref 7–25)
CALCIUM: 10.2 mg/dL (ref 8.6–10.4)
CHLORIDE: 101 mmol/L (ref 98–110)
CO2: 25 mmol/L (ref 20–31)
CREATININE: 1.58 mg/dL — AB (ref 0.60–0.93)
GFR, EST AFRICAN AMERICAN: 37 mL/min — AB (ref >=60)
GFR, Est Non African American: 32 mL/min — ABNORMAL LOW (ref >=60)
Glucose, Bld: 93 mg/dL (ref 65–99)
Potassium: 4 mmol/L (ref 3.5–5.3)
Sodium: 135 mmol/L (ref 135–146)
TOTAL PROTEIN: 6.8 g/dL (ref 6.1–8.1)

## 2014-12-15 LAB — LIPID PANEL
CHOLESTEROL: 182 mg/dL (ref 125–200)
HDL: 53 mg/dL (ref >=46)
LDL Cholesterol: 111 mg/dL (ref <130)
TRIGLYCERIDES: 88 mg/dL (ref <150)
Total CHOL/HDL Ratio: 3.4 Ratio (ref <=5.0)
VLDL: 18 mg/dL (ref <30)

## 2014-12-16 LAB — VITAMIN D 25 HYDROXY (VIT D DEFICIENCY, FRACTURES): VIT D 25 HYDROXY: 32 ng/mL (ref 30–100)

## 2014-12-18 ENCOUNTER — Ambulatory Visit (INDEPENDENT_AMBULATORY_CARE_PROVIDER_SITE_OTHER): Payer: Medicare Other | Admitting: Internal Medicine

## 2014-12-18 ENCOUNTER — Encounter: Payer: Self-pay | Admitting: Internal Medicine

## 2014-12-18 VITALS — BP 100/60 | HR 92 | Temp 98.2°F | Resp 18 | Ht 62.0 in | Wt 151.0 lb

## 2014-12-18 DIAGNOSIS — N183 Chronic kidney disease, stage 3 unspecified: Secondary | ICD-10-CM

## 2014-12-18 DIAGNOSIS — I10 Essential (primary) hypertension: Secondary | ICD-10-CM | POA: Diagnosis not present

## 2014-12-18 DIAGNOSIS — E785 Hyperlipidemia, unspecified: Secondary | ICD-10-CM | POA: Diagnosis not present

## 2014-12-18 DIAGNOSIS — Z9660 Presence of unspecified orthopedic joint implant: Secondary | ICD-10-CM | POA: Diagnosis not present

## 2014-12-18 DIAGNOSIS — I701 Atherosclerosis of renal artery: Secondary | ICD-10-CM | POA: Diagnosis not present

## 2014-12-18 DIAGNOSIS — Z0000 Encounter for general adult medical examination without abnormal findings: Secondary | ICD-10-CM | POA: Diagnosis not present

## 2014-12-18 DIAGNOSIS — M5441 Lumbago with sciatica, right side: Secondary | ICD-10-CM | POA: Diagnosis not present

## 2014-12-18 DIAGNOSIS — R7302 Impaired glucose tolerance (oral): Secondary | ICD-10-CM

## 2014-12-18 DIAGNOSIS — Z96641 Presence of right artificial hip joint: Secondary | ICD-10-CM

## 2014-12-18 DIAGNOSIS — K219 Gastro-esophageal reflux disease without esophagitis: Secondary | ICD-10-CM | POA: Diagnosis not present

## 2014-12-18 DIAGNOSIS — M5430 Sciatica, unspecified side: Secondary | ICD-10-CM | POA: Diagnosis not present

## 2014-12-20 ENCOUNTER — Other Ambulatory Visit: Payer: Self-pay | Admitting: Internal Medicine

## 2014-12-22 NOTE — Patient Instructions (Signed)
Continue same medications and return in 6 months. It was a pleasure to see you today. Continue diet exercise efforts.

## 2014-12-22 NOTE — Progress Notes (Signed)
Subjective:    Patient ID: Charlotte Duarte, female    DOB: May 30, 1940, 74 y.o.   MRN: [id number]  HPI 74 year old white female in today for health maintenance exam and evaluation of medical problems. She had a total right hip replacement by Dr. Ninfa Linden September 2013 and is done well. Labs reviewed and are acceptable. History of chronic kidney disease stage III, hypertension, hyperlipidemia, GE reflux, renal artery stenosis status post left renal artery PTCA and stenting in 2005 with PCI of an in-stent stenosis in 2007. Takes Plavix for the renal artery stenosis history. History of epidural steroid injection for lumbar spinal stenosis 2013. Blood pressure treated with Lotensin and Maxide.  Creatinine ranges around 1.4. No proteinuria. Kidney sizes have been stable. Serial assessments done  of renal artery since 2008 stable.  Patient had breast augmentation in 1980 and subsequently had an implant removal in 2007. Has had arthroscopy of both knees in 1990 and 1995 by Dr. Noemi Chapel. History of allergic rhinitis. Prior colonoscopy with Dr. Timmothy Euler who has since retired and she needs to check about this as it is time for another one.  Social alcohol consumption consisting of 10 alcoholic beverages a week. She is a lacto-ovo vegetarian. History of right bundle branch block on EKG. History of GE reflux. Zostavax vaccine given 2007. History of carpal tunnel release 1993. Sigmoidoscopy 1998. Quit smoking 1975.  Patient is employed as a Solicitor. Has a four-year college degree and is married.  Family history: Father died of acute MI at age 48. Mother with history of breast cancer and aneurysm in her leg. Brother with history of brain aneurysm. Another brother with history of ALS. Both parents with history of hypertension.    Review of Systems  Constitutional: Negative.   All other systems reviewed and are negative.      Objective:   Physical Exam  Constitutional: She is oriented to  person, place, and time. She appears well-developed and well-nourished. No distress.  HENT:  Head: Normocephalic and atraumatic.  Right Ear: External ear normal.  Left Ear: External ear normal.  Mouth/Throat: Oropharynx is clear and moist.  Eyes: Conjunctivae and EOM are normal. Pupils are equal, round, and reactive to light. Right eye exhibits no discharge. Left eye exhibits no discharge.  Neck: No JVD present. No thyromegaly present.  Cardiovascular: Normal rate, regular rhythm and normal heart sounds.   No murmur heard. Pulmonary/Chest: Effort normal and breath sounds normal. She has no wheezes. She has no rales.  Abdominal: Soft. Bowel sounds are normal. She exhibits no mass. There is no tenderness. There is no rebound.  Musculoskeletal: She exhibits no edema.  Neurological: She is alert and oriented to person, place, and time. She has normal reflexes. She displays normal reflexes. No cranial nerve deficit. She exhibits normal muscle tone. Coordination normal.  Skin: Skin is warm and dry. No rash noted. She is not diaphoretic.  Psychiatric: She has a normal mood and affect. Her behavior is normal. Judgment and thought content normal.  Vitals reviewed.         Assessment & Plan:  History of renal artery stenosis followed by [facility] in stable with chronic kidney disease stage III  Hypertension-stable  Hyperlipidemia-stable  History of allergic rhinitis  History of lumbar spinal stenosis and sciatica. Dr. Laney Pastor gave her a course of prednisone. She is now on gabapentin.  History of right hip replacement  History of breast augmentation with removal of implants 2007  History of impaired glucose  tolerance  GE reflux  Plan: Return in 6 months or as needed. Continues to see Dr. Lorrene Reid at [facility].  Subjective:   Patient presents for Medicare Annual/Subsequent preventive examination.  Review Past Medical/Family/Social: See  above   Risk Factors  Current exercise habits: Mostly secondary Dietary issues discussed: Low fat low carbohydrate  Cardiac risk factors: Hyperlipidemia, hypertension, impaired glucose tolerance  Depression Screen  (Note: if answer to either of the following is "Yes", a more complete depression screening is indicated)   Over the past two weeks, have you felt down, depressed or hopeless? No  Over the past two weeks, have you felt little interest or pleasure in doing things? No Have you lost interest or pleasure in daily life? No Do you often feel hopeless? No Do you cry easily over simple problems? No   Activities of Daily Living  In your present state of health, do you have any difficulty performing the following activities?:   Driving? No  Managing money? No  Feeding yourself? No  Getting from bed to chair? No  Climbing a flight of stairs? No  Preparing food and eating?: No  Bathing or showering? No  Getting dressed: No  Getting to the toilet? No  Using the toilet:No  Moving around from place to place: Due to back pain In the past year have you fallen or had a near fall?:No  Are you sexually active? No  Do you have more than one partner? No   Hearing Difficulties: No  Do you often ask people to speak up or repeat themselves? No  Do you experience ringing or noises in your ears? No  Do you have difficulty understanding soft or whispered voices? No  Do you feel that you have a problem with memory? No Do you often misplace items? No    Home Safety:  Do you have a smoke alarm at your residence? Yes Do you have grab bars in the bathroom? No Do you have throw rugs in your house? Yes  Cognitive Testing  Alert? Yes Normal Appearance?Yes  Oriented to person? Yes Place? Yes  Time? Yes  Recall of three objects? Yes  Can perform simple calculations? Yes  Displays appropriate judgment?Yes  Can read the correct time from a watch face?Yes   List the Names of Other  Physician/Practitioners you currently use:  See referral list for the physicians patient is currently seeing.  Dr. Lorrene Reid   Review of Systems: See above   Objective:     General appearance: Appears stated age  Head: Normocephalic, without obvious abnormality, atraumatic  Eyes: conj clear, EOMi PEERLA  Ears: normal TM's and external ear canals both ears  Nose: Nares normal. Septum midline. Mucosa normal. No drainage or sinus tenderness.  Throat: lips, mucosa, and tongue normal; teeth and gums normal  Neck: no adenopathy, no carotid bruit, no JVD, supple, symmetrical, trachea midline and thyroid not enlarged, symmetric, no tenderness/mass/nodules  No CVA tenderness.  Lungs: clear to auscultation bilaterally  Breasts: normal appearance, no masses or tenderness Heart: regular rate and rhythm, S1, S2 normal, no murmur, click, rub or gallop  Abdomen: soft, non-tender; bowel sounds normal; no masses, no organomegaly  Musculoskeletal: ROM normal in all joints, no crepitus, no deformity, Normal muscle strengthen. Back  is symmetric, no curvature. Skin: Skin color, texture, turgor normal. No rashes or lesions  Lymph nodes: Cervical, supraclavicular, and axillary nodes normal.  Neurologic: CN 2 -12 Normal, Normal symmetric reflexes. Normal coordination and gait  Psych: Alert &  Oriented x 3, Mood appear stable.    Assessment:    Annual wellness medicare exam   Plan:    During the course of the visit the patient was educated and counseled about appropriate screening and preventive services including:  Annual mammogram  Anal flu vaccine      Patient Instructions (the written plan) was given to the patient.  Medicare Attestation  I have personally reviewed:  The patient's medical and social history  Their use of alcohol, tobacco or illicit drugs  Their current medications and supplements  The patient's functional ability including ADLs,fall risks, home safety risks, cognitive, and  hearing and visual impairment  Diet and physical activities  Evidence for depression or mood disorders  The patient's weight, height, BMI, and visual acuity have been recorded in the chart. I have made referrals, counseling, and provided education to the patient based on review of the above and I have provided the patient with a written personalized care plan for preventive services.

## 2014-12-24 DIAGNOSIS — M5416 Radiculopathy, lumbar region: Secondary | ICD-10-CM | POA: Diagnosis not present

## 2015-01-07 DIAGNOSIS — M545 Low back pain: Secondary | ICD-10-CM | POA: Diagnosis not present

## 2015-01-14 DIAGNOSIS — M5441 Lumbago with sciatica, right side: Secondary | ICD-10-CM | POA: Diagnosis not present

## 2015-01-28 DIAGNOSIS — G8929 Other chronic pain: Secondary | ICD-10-CM | POA: Diagnosis not present

## 2015-01-28 DIAGNOSIS — M5441 Lumbago with sciatica, right side: Secondary | ICD-10-CM | POA: Diagnosis not present

## 2015-01-30 ENCOUNTER — Telehealth: Payer: Self-pay | Admitting: Internal Medicine

## 2015-01-30 NOTE — Telephone Encounter (Signed)
°  Please call [facility] @ [place] @ [phone], Ext [phone]   Requesting Medical Clearance for patient to come off of Plavix and Aspirin so that she may have 1 injection on 12/27 (back injection) by Dr. Nelva Bush.  Please advise.

## 2015-01-30 NOTE — Telephone Encounter (Signed)
Would only need to discontinue about 5 days

## 2015-02-03 DIAGNOSIS — G8929 Other chronic pain: Secondary | ICD-10-CM | POA: Diagnosis not present

## 2015-02-03 DIAGNOSIS — M5441 Lumbago with sciatica, right side: Secondary | ICD-10-CM | POA: Diagnosis not present

## 2015-02-03 NOTE — Telephone Encounter (Signed)
Faxed info to [facility] @ [phone].

## 2015-02-11 DIAGNOSIS — M5441 Lumbago with sciatica, right side: Secondary | ICD-10-CM | POA: Diagnosis not present

## 2015-02-11 DIAGNOSIS — G8929 Other chronic pain: Secondary | ICD-10-CM | POA: Diagnosis not present

## 2015-02-18 DIAGNOSIS — M5441 Lumbago with sciatica, right side: Secondary | ICD-10-CM | POA: Diagnosis not present

## 2015-02-18 DIAGNOSIS — G8929 Other chronic pain: Secondary | ICD-10-CM | POA: Diagnosis not present

## 2015-02-25 DIAGNOSIS — M4806 Spinal stenosis, lumbar region: Secondary | ICD-10-CM | POA: Diagnosis not present

## 2015-02-25 DIAGNOSIS — M5441 Lumbago with sciatica, right side: Secondary | ICD-10-CM | POA: Diagnosis not present

## 2015-02-25 DIAGNOSIS — M5136 Other intervertebral disc degeneration, lumbar region: Secondary | ICD-10-CM | POA: Diagnosis not present

## 2015-03-09 DIAGNOSIS — M5136 Other intervertebral disc degeneration, lumbar region: Secondary | ICD-10-CM | POA: Diagnosis not present

## 2015-03-09 DIAGNOSIS — M5441 Lumbago with sciatica, right side: Secondary | ICD-10-CM | POA: Diagnosis not present

## 2015-03-09 DIAGNOSIS — M4806 Spinal stenosis, lumbar region: Secondary | ICD-10-CM | POA: Diagnosis not present

## 2015-03-14 ENCOUNTER — Ambulatory Visit (INDEPENDENT_AMBULATORY_CARE_PROVIDER_SITE_OTHER): Payer: Medicare Other | Admitting: Physician Assistant

## 2015-03-14 VITALS — BP 128/78 | HR 70 | Temp 97.7°F | Ht 60.75 in | Wt 154.0 lb

## 2015-03-14 DIAGNOSIS — R82998 Other abnormal findings in urine: Secondary | ICD-10-CM

## 2015-03-14 DIAGNOSIS — L298 Other pruritus: Secondary | ICD-10-CM

## 2015-03-14 DIAGNOSIS — N390 Urinary tract infection, site not specified: Secondary | ICD-10-CM

## 2015-03-14 DIAGNOSIS — R350 Frequency of micturition: Secondary | ICD-10-CM | POA: Diagnosis not present

## 2015-03-14 DIAGNOSIS — N898 Other specified noninflammatory disorders of vagina: Secondary | ICD-10-CM

## 2015-03-14 LAB — POCT URINALYSIS DIP (MANUAL ENTRY)
Bilirubin, UA: NEGATIVE
Glucose, UA: NEGATIVE
Ketones, POC UA: NEGATIVE
NITRITE UA: NEGATIVE
PH UA: 6.5
SPEC GRAV UA: 1.01
UROBILINOGEN UA: 0.2

## 2015-03-14 LAB — POCT WET + KOH PREP
TRICH BY WET PREP: ABSENT
YEAST BY KOH: ABSENT
Yeast by wet prep: ABSENT

## 2015-03-14 LAB — POC MICROSCOPIC URINALYSIS (UMFC): Mucus: ABSENT

## 2015-03-14 MED ORDER — CEPHALEXIN 500 MG PO CAPS: 500.0000 mg | ORAL_CAPSULE | Freq: Two times a day (BID) | ORAL | Status: AC

## 2015-03-14 NOTE — Patient Instructions (Addendum)
Please take antibiotic as prescribed.   I will contact you with the results of the urine culture.   Go ahead and start probiotic regimen.

## 2015-03-14 NOTE — Progress Notes (Signed)
[facility] [address], [place] [address] [phone]  Date:  03/14/2015   Name:  Charlotte Duarte   DOB:  08-14-1940   MRN:  [id number]  PCP:  Elby Showers, MD    History of Present Illness:  Charlotte Duarte is a 75 y.o. female patient who presents to [facility] for cc of vaginal pruritus and urinary frequency for 1 week. One week of vaginal itching.  No discharge.  Urinary frequency, without dysuria or hematuria.  Sexually active--sciatica can not have sexual activity No nausea, abdominal pain, or new onset of back pain.   No fever.  She has not been sexually active for a few months secondary to sciatica.   Patient Active Problem List   Diagnosis Date Noted  . Arthritis pain of hand 09/13/2012  . 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 Spinal stenosis 09/20/2011  . History of renal artery stenosis 09/20/2011  . Impaired glucose tolerance 09/20/2011  . Allergic rhinitis 09/20/2011  . GE reflux 02/21/2011  . Atrophic vaginitis   . SUI (stress urinary incontinence, female)   . Hypertension 07/27/2010  . Hyperlipidemia 07/27/2010  . BACK PAIN 03/09/2010  . HIP PAIN, RIGHT 02/23/2010  . ITBS, RIGHT KNEE 02/23/2010  . SHOULDER PAIN, RIGHT 05/11/2009  . BICEPS TENDINITIS, RIGHT 05/11/2009  . RENAL DISEASE, CHRONIC, STAGE III 04/20/2009    Past Medical History  Diagnosis Date  . Hyperlipidemia   . Hypertension   . Atrophic vaginitis   . SUI (stress urinary incontinence, female)   . GERD (gastroesophageal reflux disease)   . H/O hiatal hernia   . Arthritis   . 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 Renal artery stenosis ([place])   . 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 Allergy   . Cataract     Past Surgical History  Procedure Laterality Date  . Carpal tunnel release  1995    right  . Knee arthroscopy      BOTH KNEES  . Cataract extraction  20 yrs ago    both eyes   . Renal stint      RENAL STINT LEFT RENAL ARTERY IN 2005.  RENAL  STINT REPLACE 03/2005.  . Bilateral capsulectomy  2008  . Augmentation mammaplasty  1980     both breasts implants REMOVED IN 2005  . Total hip arthroplasty  11/11/2011    Procedure: TOTAL HIP ARTHROPLASTY ANTERIOR APPROACH;  Surgeon: Mcarthur Rossetti, MD;  Location: [facility];  Service: Orthopedics;  Laterality: Right;  Right Total Hip Arthroplasty  . Eye surgery      Social History  Substance Use Topics  . Smoking status: Former Smoker    Types: Cigarettes    Quit date: 07/27/1970  . Smokeless tobacco: Never Used  . Alcohol Use: 6.0 oz/week    10 Glasses of wine per week     Comment: 1-2 glasses of wine daily    Family History  Problem Relation Age of Onset  . Mental illness Mother   . Breast cancer Mother 31  . Hypertension Mother   . Heart disease Father   . ALS Brother   . Stroke Brother     Allergies  Allergen Reactions  . Augmentin [Amoxicillin-Pot Clavulanate] Nausea And Vomiting  . Tamiflu [Oseltamivir Phosphate] Nausea And Vomiting    Medication list has been reviewed and updated.  Current Outpatient Prescriptions on File Prior to Visit  Medication Sig Dispense Refill  .  aspirin 81 MG tablet Take 81 mg by mouth daily.    . benazepril (LOTENSIN) 10 MG tablet Take 1 tablet by mouth  daily with breakfast 90 tablet 3  . calcium carbonate (TUMS - DOSED IN MG ELEMENTAL CALCIUM) 500 MG chewable tablet Chew 1 tablet by mouth as needed. Prn reflux    . clopidogrel (PLAVIX) 75 MG tablet Take 1 tablet by mouth  daily with breakfast 90 tablet 3  . fexofenadine (ALLEGRA) 180 MG tablet Take 180 mg by mouth every morning.     . fluticasone (FLONASE) 50 MCG/ACT nasal spray Place 2 sprays into both nostrils daily. 16 g 5  . omeprazole (PRILOSEC) 20 MG capsule Take 1 capsule (20 mg total) by mouth daily. 30 capsule 5  . rosuvastatin (CRESTOR) 5 MG tablet Take 1 tablet by mouth  daily 90 tablet 3  . triamterene-hydrochlorothiazide (MAXZIDE) 75-50 MG per tablet Take 1 tablet  by mouth  daily with breakfast 90 tablet 3  . co-enzyme Q-10 30 MG capsule Take 30 mg by mouth 3 (three) times daily. Reported on 03/14/2015    . predniSONE (DELTASONE) 20 MG tablet 4/3/3/2/2/1/1 single daily dose for 7 days (Patient not taking: Reported on 03/14/2015) 16 tablet 0   No current facility-administered medications on file prior to visit.    ROS ROS otherwise unremarkable unless listed above.   Physical Examination: BP 128/78 mmHg  Pulse 70  Temp(Src) 97.7 F (36.5 C) (Oral)  Ht 5' 0.75" (1.543 m)  Wt 154 lb (69.854 kg)  BMI 29.34 kg/m2  SpO2 98% Ideal Body Weight: Weight in (lb) to have BMI = 25: 131  Physical Exam  Constitutional: She is oriented to person, place, and time. She appears well-developed and well-nourished. No distress.  HENT:  Head: Normocephalic and atraumatic.  Right Ear: External ear normal.  Left Ear: External ear normal.  Eyes: Conjunctivae and EOM are normal. Pupils are equal, round, and reactive to light.  Cardiovascular: Normal rate.   Pulmonary/Chest: Effort normal. No respiratory distress.  Genitourinary: Pelvic exam was performed with patient supine. There is no rash on the right labia. There is no rash on the left labia. Cervix exhibits no discharge and no friability. No tenderness or bleeding in the vagina. No vaginal discharge found.  Atrophy of the vagina without erythema or bleeding.  Clear discharge very minimal.    Neurological: She is alert and oriented to person, place, and time.  Skin: She is not diaphoretic.  Psychiatric: She has a normal mood and affect. Her behavior is normal.   Results for orders placed or performed in visit on 03/14/15  POCT urinalysis dipstick  Result Value Ref Range   Color, UA yellow yellow   Clarity, UA cloudy (A) clear   Glucose, UA negative negative   Bilirubin, UA negative negative   Ketones, POC UA negative negative   Spec Grav, UA 1.010    Blood, UA moderate (A) negative   pH, UA 6.5     Protein Ur, POC trace (A) negative   Urobilinogen, UA 0.2    Nitrite, UA Negative Negative   Leukocytes, UA moderate (2+) (A) Negative  POCT Microscopic Urinalysis ([facility])  Result Value Ref Range   WBC,UR,HPF,POC Too numerous to count  (A) None WBC/hpf   RBC,UR,HPF,POC Moderate (A) None RBC/hpf   Bacteria Few (A) None, Too numerous to count   Mucus Absent Absent   Epithelial Cells, UR Per Microscopy Few (A) None, Too numerous to count cells/hpf  POCT Wet +  KOH Prep  Result Value Ref Range   Yeast by KOH Absent Present, Absent   Yeast by wet prep Absent Present, Absent   WBC by wet prep Few None, Few, Too numerous to count   Clue Cells Wet Prep HPF POC None None, Too numerous to count   Trich by wet prep Absent Present, Absent   Bacteria Wet Prep HPF POC Few None, Few, Too numerous to count   Epithelial Cells By [employer] ([facility]) Few None, Few, Too numerous to count   RBC,UR,HPF,POC None None RBC/hpf     Assessment and Plan: Charlotte Duarte is a 75 y.o. female who is here today for urinary frequency and vaginal irritation.   Starting on keflex.  We will discontinue if the urine culture is negative. Advised to start a probiotic.    Frequency of urination - Plan: POCT urinalysis dipstick, POCT Microscopic Urinalysis ([facility]), POCT Wet + KOH Prep, Urine culture, cephALEXin (KEFLEX) 500 MG capsule, CANCELED: POCT UA - Microscopic Only, CANCELED: POCT urinalysis dipstick  Vaginal itching - Plan: POCT urinalysis dipstick, POCT Microscopic Urinalysis ([facility]), POCT Wet + KOH Prep, cephALEXin (KEFLEX) 500 MG capsule, CANCELED: POCT UA - Microscopic Only, CANCELED: POCT urinalysis dipstick  Leukocytes in urine - Plan: cephALEXin (KEFLEX) 500 MG capsule   Ivar Drape, PA-C [facility] 03/14/2015 9:17 AM

## 2015-03-17 LAB — URINE CULTURE: Colony Count: >=100000

## 2015-03-24 ENCOUNTER — Ambulatory Visit (INDEPENDENT_AMBULATORY_CARE_PROVIDER_SITE_OTHER): Payer: Medicare Other | Admitting: Internal Medicine

## 2015-03-24 VITALS — BP 100/52 | HR 100 | Temp 97.7°F | Resp 18 | Ht 62.0 in | Wt 158.0 lb

## 2015-03-24 DIAGNOSIS — Z8679 Personal history of other diseases of the circulatory system: Secondary | ICD-10-CM

## 2015-03-24 DIAGNOSIS — I959 Hypotension, unspecified: Secondary | ICD-10-CM | POA: Diagnosis not present

## 2015-03-24 DIAGNOSIS — Z87448 Personal history of other diseases of urinary system: Secondary | ICD-10-CM

## 2015-03-24 DIAGNOSIS — N390 Urinary tract infection, site not specified: Secondary | ICD-10-CM

## 2015-03-24 DIAGNOSIS — L500 Allergic urticaria: Secondary | ICD-10-CM | POA: Diagnosis not present

## 2015-03-24 LAB — POCT CBC
GRANULOCYTE PERCENT: 88.8 % — AB (ref 37–80)
HEMATOCRIT: 41.4 % (ref 37.7–47.9)
Hemoglobin: 13.7 g/dL (ref 12.2–16.2)
LYMPH, POC: 0.7 (ref 0.6–3.4)
MCH, POC: 27.4 pg (ref 27–31.2)
MCHC: 33.1 g/dL (ref 31.8–35.4)
MCV: 82.7 fL (ref 80–97)
MID (CBC): 0.1 (ref 0–0.9)
MPV: 6.9 fL (ref 0–99.8)
POC GRANULOCYTE: 6.8 (ref 2–6.9)
POC LYMPH %: 9.6 % — AB (ref 10–50)
POC MID %: 1.6 %M (ref 0–12)
Platelet Count, POC: 317 10*3/uL (ref 142–424)
RBC: 5.01 M/uL (ref 4.04–5.48)
RDW, POC: 13.7 %
WBC: 7.7 10*3/uL (ref 4.6–10.2)

## 2015-03-24 MED ORDER — PREDNISONE 20 MG PO TABS: ORAL_TABLET | ORAL | Status: DC

## 2015-03-24 NOTE — Progress Notes (Addendum)
Subjective:  By signing my name below, I, Essence Howell, attest that this documentation has been prepared under the direction and in the presence of Leandrew Koyanagi, MD Electronically Signed: Ladene Artist, ED Scribe 03/24/2015 at 6:05 PM.   Patient ID: Charlotte Duarte, female    DOB: November 05, 1940, 75 y.o.   MRN: [id number]  Chief Complaint  Patient presents with  . Medication Reaction    for antibiotics, hives   HPI HPI Comments: Charlotte Duarte is a 75 y.o. female, with a h/o HTN, who presents to the [facility] complaining of gradually improving, generalized pruritic rash onset 2d ago. Pt was started on Keflex 10 days ago for a vaginal itching/Klebsiella UTI, which resolved with the antibiotic. She finished Keflex 3 days ago.  She also reports associated chills, 2 episodes of orange colored diarrhea this afternoon and low blood pressure. She became worried that her blood pressure was 123XX123 systolic at home and so she did not take her blood pressure medicine today. She feels a bit unsteady  Triage BP: 100/52. Pulse 100  Pt has tried Benadryl and 2 doses of Zyrtec since yesterday for rash with mild relief. She denies fever, rash in mouth/tongue, abdominal pain, nausea, vomiting, any urinary symptoms. Pt is currently taking Maxzide and Lotensin, but she denies new medications.   Past Medical History  Diagnosis Date  . Hyperlipidemia   . Hypertension   . Atrophic vaginitis   . SUI (stress urinary incontinence, female)   . GERD (gastroesophageal reflux disease)   . H/O hiatal hernia   . Arthritis   . 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 Renal artery stenosis ([place])   . 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 Allergy   . Cataract    Current Outpatient Prescriptions on File Prior to Visit  Medication Sig Dispense Refill  . aspirin 81 MG tablet Take 81 mg by mouth daily.    . benazepril (LOTENSIN) 10 MG tablet Take 1 tablet by mouth  daily with breakfast  90 tablet 3  . calcium carbonate (TUMS - DOSED IN MG ELEMENTAL CALCIUM) 500 MG chewable tablet Chew 1 tablet by mouth as needed. Prn reflux    . clopidogrel (PLAVIX) 75 MG tablet Take 1 tablet by mouth  daily with breakfast 90 tablet 3  . fexofenadine (ALLEGRA) 180 MG tablet Take 180 mg by mouth every morning.     . fluticasone (FLONASE) 50 MCG/ACT nasal spray Place 2 sprays into both nostrils daily. 16 g 5  . omeprazole (PRILOSEC) 20 MG capsule Take 1 capsule (20 mg total) by mouth daily. 30 capsule 5  . predniSONE (DELTASONE) 20 MG tablet 4/3/3/2/2/1/1 single daily dose for 7 days 16 tablet 0  . rosuvastatin (CRESTOR) 5 MG tablet Take 1 tablet by mouth  daily 90 tablet 3  . triamterene-hydrochlorothiazide (MAXZIDE) 75-50 MG per tablet Take 1 tablet by mouth  daily with breakfast 90 tablet 3  . co-enzyme Q-10 30 MG capsule Take 30 mg by mouth 3 (three) times daily. Reported on 03/24/2015     No current facility-administered medications on file prior to visit.   Allergies  Allergen Reactions  . Augmentin [Amoxicillin-Pot Clavulanate] Nausea And Vomiting  . Tamiflu [Oseltamivir Phosphate] Nausea And Vomiting   Review of Systems  Constitutional: Positive for chills and fatigue. Negative for fever.  HENT: Negative for rhinorrhea and trouble swallowing.   Eyes: Negative for visual disturbance.  Respiratory: Negative for  chest tightness, shortness of breath and wheezing.   Cardiovascular: Negative for chest pain and palpitations.  Gastrointestinal: Positive for diarrhea. Negative for nausea, vomiting and abdominal pain.  Genitourinary: Positive for frequency. Negative for dysuria and difficulty urinating.  Skin: Positive for rash.  Neurological: Negative for headaches.  Hematological: Does not bruise/bleed easily.      Objective:   Physical Exam  Constitutional: She is oriented to person, place, and time. She appears well-developed and well-nourished. No distress.  HENT:  Head:  Normocephalic and atraumatic.  Nose: Nose normal.  Mouth/Throat: Oropharynx is clear and moist.  Eyes: Conjunctivae and EOM are normal. Pupils are equal, round, and reactive to light.  Neck: Neck supple. No thyromegaly present.  Cardiovascular: Normal rate, regular rhythm, normal heart sounds and intact distal pulses.   No murmur heard. Rate 95 sitting  Pulmonary/Chest: Effort normal. No respiratory distress.  Musculoskeletal: Normal range of motion. She exhibits no edema.  Lymphadenopathy:    She has no cervical adenopathy.  Neurological: She is alert and oriented to person, place, and time. No cranial nerve deficit.  She is able to walk with a stable gait and does not have postural dizziness in the exam room  Skin: Skin is warm and dry. Rash noted. Rash is urticarial.  Multiple hives over torso and extremities, sparing the face and mouth. These have the appearance of fating back into the skin. No vesiculation. No ecchymoses or petechiae. Rash is a bit more confluent over the forearms. Palms spared.  Psychiatric: She has a normal mood and affect. Her behavior is normal.  Nursing note and vitals reviewed. BP 100/52 mmHg  Pulse 100  Temp(Src) 97.7 F (36.5 C) (Oral)  Resp 18  Ht 5\' 2"  (1.575 m)  Wt 158 lb (71.668 kg)  BMI 28.89 kg/m2  SpO2 97% Results for orders placed or performed in visit on 03/24/15  POCT CBC  Result Value Ref Range   WBC 7.7 4.6 - 10.2 K/uL   Lymph, poc 0.7 0.6 - 3.4   POC LYMPH PERCENT 9.6 (A) 10 - 50 %L   MID (cbc) 0.1 0 - 0.9   POC MID % 1.6 0 - 12 %M   POC Granulocyte 6.8 2 - 6.9   Granulocyte percent 88.8 (A) 37 - 80 %G   RBC 5.01 4.04 - 5.48 M/uL   Hemoglobin 13.7 12.2 - 16.2 g/dL   HCT, POC 41.4 37.7 - 47.9 %   MCV 82.7 80 - 97 fL   MCH, POC 27.4 27 - 31.2 pg   MCHC 33.1 31.8 - 35.4 g/dL   RDW, POC 13.7 %   Platelet Count, POC 317 142 - 424 K/uL   MPV 6.9 0 - 99.8 fL      she was unable to give a urine Assessment & Plan:  Allergic urticaria  - Plan: Continue Zyrtec 10 mg twice a day -Consider starting prednisone if rash not resolved by tomorrow  Hypotension, unspecified hypotension type - Plan: She will hold her blood pressure medicines until she is hypertensive //(push fluids and electrolytes tonight  -Comprehensive metabolic panel  UTI (urinary tract infection) with pyuria/culture-positive Klebsiella// - Plan: POCT urinalysis dipstick, POCT Microscopic Urinalysis ([facility]) and possibly repeat culture--- she will bring the specimen in the morning  History of renal artery stenosis with stenting   I have completed the patient encounter in its entirety as documented by the scribe, with editing by me where necessary. Cole Klugh P. Laney Pastor, M.D.  Addend 03/26/15 12noon Labs Results  for orders placed or performed in visit on 03/24/15  Comprehensive metabolic panel  Result Value Ref Range   Sodium 133 (L) 135 - 146 mmol/L   Potassium 3.6 3.5 - 5.3 mmol/L   Chloride 96 (L) 98 - 110 mmol/L   CO2 21 20 - 31 mmol/L   Glucose, Bld 132 (H) 65 - 99 mg/dL   BUN 41 (H) 7 - 25 mg/dL   Creat 2.35 (H) 0.60 - 0.93 mg/dL   Total Bilirubin 0.7 0.2 - 1.2 mg/dL   Alkaline Phosphatase 52 33 - 130 U/L   AST 31 10 - 35 U/L   ALT 21 6 - 29 U/L   Total Protein 6.1 6.1 - 8.1 g/dL   Albumin 4.0 3.6 - 5.1 g/dL   Calcium 9.4 8.6 - 10.4 mg/dL  POCT CBC  Result Value Ref Range   Today her rash better but decr urine output and facial swelling so ?renal insult from allergic reaction with increasing renal insufficiency--will have her come in now to see Dr Carlean Jews

## 2015-03-25 LAB — COMPREHENSIVE METABOLIC PANEL
ALBUMIN: 4 g/dL (ref 3.6–5.1)
ALT: 21 U/L (ref 6–29)
AST: 31 U/L (ref 10–35)
Alkaline Phosphatase: 52 U/L (ref 33–130)
BUN: 41 mg/dL — ABNORMAL HIGH (ref 7–25)
CALCIUM: 9.4 mg/dL (ref 8.6–10.4)
CHLORIDE: 96 mmol/L — AB (ref 98–110)
CO2: 21 mmol/L (ref 20–31)
Creat: 2.35 mg/dL — ABNORMAL HIGH (ref 0.60–0.93)
Glucose, Bld: 132 mg/dL — ABNORMAL HIGH (ref 65–99)
POTASSIUM: 3.6 mmol/L (ref 3.5–5.3)
Sodium: 133 mmol/L — ABNORMAL LOW (ref 135–146)
TOTAL PROTEIN: 6.1 g/dL (ref 6.1–8.1)
Total Bilirubin: 0.7 mg/dL (ref 0.2–1.2)

## 2015-03-26 ENCOUNTER — Telehealth: Payer: Self-pay

## 2015-03-26 ENCOUNTER — Ambulatory Visit (INDEPENDENT_AMBULATORY_CARE_PROVIDER_SITE_OTHER): Payer: Medicare Other | Admitting: Family Medicine

## 2015-03-26 VITALS — BP 120/66 | HR 75 | Temp 98.0°F | Resp 17 | Ht 62.0 in | Wt 166.0 lb

## 2015-03-26 DIAGNOSIS — R5381 Other malaise: Secondary | ICD-10-CM | POA: Diagnosis not present

## 2015-03-26 DIAGNOSIS — R319 Hematuria, unspecified: Secondary | ICD-10-CM | POA: Diagnosis not present

## 2015-03-26 DIAGNOSIS — R7989 Other specified abnormal findings of blood chemistry: Secondary | ICD-10-CM | POA: Diagnosis not present

## 2015-03-26 LAB — POCT CBC
Granulocyte percent: 87.5 %G — AB (ref 37–80)
HCT, POC: 32.5 % — AB (ref 37.7–47.9)
Hemoglobin: 11.4 g/dL — AB (ref 12.2–16.2)
Lymph, poc: 0.6 (ref 0.6–3.4)
MCH, POC: 28.3 pg (ref 27–31.2)
MCHC: 35.1 g/dL (ref 31.8–35.4)
MCV: 80.6 fL (ref 80–97)
MID (cbc): 0.3 (ref 0–0.9)
MPV: 6.4 fL (ref 0–99.8)
POC Granulocyte: 6.5 (ref 2–6.9)
POC LYMPH PERCENT: 8.7 %L — AB (ref 10–50)
POC MID %: 3.8 %M (ref 0–12)
Platelet Count, POC: 290 10*3/uL (ref 142–424)
RBC: 4.03 M/uL — AB (ref 4.04–5.48)
RDW, POC: 13.1 %
WBC: 7.4 10*3/uL (ref 4.6–10.2)

## 2015-03-26 LAB — POCT URINALYSIS DIP (MANUAL ENTRY)
Bilirubin, UA: NEGATIVE
Glucose, UA: NEGATIVE
Ketones, POC UA: NEGATIVE
Leukocytes, UA: NEGATIVE
Nitrite, UA: NEGATIVE
Spec Grav, UA: <=1.005
Urobilinogen, UA: 0.2
pH, UA: 5.5

## 2015-03-26 LAB — POC MICROSCOPIC URINALYSIS (UMFC): Mucus: ABSENT

## 2015-03-26 LAB — POCT SEDIMENTATION RATE: POCT SED RATE: 18 mm/hr (ref 0–22)

## 2015-03-26 NOTE — Progress Notes (Addendum)
Subjective:    Patient ID: Charlotte Duarte, female    DOB: 12/14/1940, 75 y.o.   MRN: [id number] By signing my name below, I, Zola Button, attest that this documentation has been prepared under the direction and in the presence of Robyn Haber, MD.  Electronically Signed: Zola Button, Medical Scribe. 03/26/2015. 1:29 PM.  HPI HPI Comments: Charlotte Duarte is a 75 y.o. female with a history of renal artery stenosis and chronic renal disease stage III who presents to the [facility] for a follow-up for allergic urticaria. Patient was seen by Dr. Laney Pastor 2 days ago; at that time, she had a generalized urticarial rash for 2 days. She had been put on 7 days of 500 mg Keflex 10 days prior for urinary symptoms due to UTI or vaginitis. Today, patient notes that the itchiness has resolved with the prednisone. She also reports having urinary retention, mild facial swelling, abdominal bloating, and loss of appetite (for food and fluids). She also vomited a small amount yesterday. Patient denies sore throat and abdominal pain. She also denies flu-like symptoms. She did discontinue her blood pressure medications as instructed.  Patient is retired. She used to work as a Estate manager/land agent.  Note by Dr. Laney Pastor 03/24/15: Charlotte Duarte is a 75 y.o. female, with a h/o HTN, who presents to the [facility] complaining of gradually improving, generalized pruritic rash onset 2d ago. Pt was started on Keflex 10 days ago for a vaginal itching/Klebsiella UTI, which resolved with the antibiotic. She finished Keflex 3 days ago.  She also reports associated chills, 2 episodes of orange colored diarrhea this afternoon and low blood pressure. She became worried that her blood pressure was 123XX123 systolic at home and so she did not take her blood pressure medicine today. She feels a bit unsteady  Triage BP: 100/52. Pulse 100 Pt has tried Benadryl and 2 doses of Zyrtec since yesterday for  rash with mild relief. She denies fever, rash in mouth/tongue, abdominal pain, nausea, vomiting, any urinary symptoms. Pt is currently taking Maxzide and Lotensin, but she denies new medications.  Review of Systems  Constitutional: Positive for appetite change. Negative for fever.  HENT: Negative for sore throat.   Gastrointestinal: Positive for vomiting and abdominal distention. Negative for abdominal pain.  Genitourinary: Positive for difficulty urinating.  Skin: Positive for rash.       Objective:   Physical Exam CONSTITUTIONAL: Well developed/well nourished HEAD: Normocephalic/atraumatic. Face somewhat puffy. EYES: EOM/PERRL ENMT: Mucous membranes moist NECK: supple no meningeal signs SPINE: entire spine nontender CV: S1/S2 noted, no murmurs/rubs/gallops noted. Heart sounds normal. LUNGS: Lungs are clear to auscultation bilaterally, no apparent distress ABDOMEN: soft, nontender, no rebound or guarding GU: no cva tenderness NEURO: Pt is awake/alert, moves all extremitiesx4 EXTREMITIES: pulses normal, full ROM. Trace edema in the lower extremities. SKIN: warm, color normal PSYCH: no abnormalities of mood noted Results for orders placed or performed in visit on 03/24/15  Comprehensive metabolic panel  Result Value Ref Range   Sodium 133 (L) 135 - 146 mmol/L   Potassium 3.6 3.5 - 5.3 mmol/L   Chloride 96 (L) 98 - 110 mmol/L   CO2 21 20 - 31 mmol/L   Glucose, Bld 132 (H) 65 - 99 mg/dL   BUN 41 (H) 7 - 25 mg/dL   Creat 2.35 (H) 0.60 - 0.93 mg/dL   Total Bilirubin 0.7 0.2 - 1.2 mg/dL   Alkaline Phosphatase 52 33 - 130  U/L   AST 31 10 - 35 U/L   ALT 21 6 - 29 U/L   Total Protein 6.1 6.1 - 8.1 g/dL   Albumin 4.0 3.6 - 5.1 g/dL   Calcium 9.4 8.6 - 10.4 mg/dL  POCT CBC  Result Value Ref Range   WBC 7.7 4.6 - 10.2 K/uL   Lymph, poc 0.7 0.6 - 3.4   POC LYMPH PERCENT 9.6 (A) 10 - 50 %L   MID (cbc) 0.1 0 - 0.9   POC MID % 1.6 0 - 12 %M   POC Granulocyte 6.8 2 - 6.9    Granulocyte percent 88.8 (A) 37 - 80 %G   RBC 5.01 4.04 - 5.48 M/uL   Hemoglobin 13.7 12.2 - 16.2 g/dL   HCT, POC 41.4 37.7 - 47.9 %   MCV 82.7 80 - 97 fL   MCH, POC 27.4 27 - 31.2 pg   MCHC 33.1 31.8 - 35.4 g/dL   RDW, POC 13.7 %   Platelet Count, POC 317 142 - 424 K/uL   MPV 6.9 0 - 99.8 fL   U/A today:  Small blood only  Results for orders placed or performed in visit on 03/26/15  POCT CBC  Result Value Ref Range   WBC 7.4 4.6 - 10.2 K/uL   Lymph, poc 0.6 0.6 - 3.4   POC LYMPH PERCENT 8.7 (A) 10 - 50 %L   MID (cbc) 0.3 0 - 0.9   POC MID % 3.8 0 - 12 %M   POC Granulocyte 6.5 2 - 6.9   Granulocyte percent 87.5 (A) 37 - 80 %G   RBC 4.03 (A) 4.04 - 5.48 M/uL   Hemoglobin 11.4 (A) 12.2 - 16.2 g/dL   HCT, POC 32.5 (A) 37.7 - 47.9 %   MCV 80.6 80 - 97 fL   MCH, POC 28.3 27 - 31.2 pg   MCHC 35.1 31.8 - 35.4 g/dL   RDW, POC 13.1 %   Platelet Count, POC 290 142 - 424 K/uL   MPV 6.4 0 - 99.8 fL  POCT urinalysis dipstick  Result Value Ref Range   Color, UA yellow yellow   Clarity, UA clear clear   Glucose, UA negative negative   Bilirubin, UA negative negative   Ketones, POC UA negative negative   Spec Grav, UA <=1.005    Blood, UA small (A) negative   pH, UA 5.5    Protein Ur, POC trace (A) negative   Urobilinogen, UA 0.2    Nitrite, UA Negative Negative   Leukocytes, UA Negative Negative  POCT Microscopic Urinalysis ([facility])  Result Value Ref Range   WBC,UR,HPF,POC None None WBC/hpf   RBC,UR,HPF,POC None None RBC/hpf   Bacteria None None, Too numerous to count   Mucus Absent Absent   Epithelial Cells, UR Per Microscopy Few (A) None, Too numerous to count cells/hpf        Assessment & Plan:   This chart was scribed in my presence and reviewed by me personally.    ICD-9-CM ICD-10-CM   1. Azotemia 790.6 R79.89 POCT CBC     POCT urinalysis dipstick     POCT Microscopic Urinalysis ([facility])     COMPLETE METABOLIC PANEL WITH GFR     POCT SEDIMENTATION RATE     Urine  culture     Antistreptolysin O titer  2. Malaise 780.79 R53.81 POCT CBC     POCT urinalysis dipstick     POCT Microscopic Urinalysis ([facility])  COMPLETE METABOLIC PANEL WITH GFR     POCT SEDIMENTATION RATE     Urine culture     Antistreptolysin O titer     Signed, Robyn Haber, MD

## 2015-03-26 NOTE — Patient Instructions (Signed)
Keep appt with Urology

## 2015-03-26 NOTE — Telephone Encounter (Signed)
Itching all over body from antibiotics - using the creame for treatment.   Has not peed very much - drak a quart of water - still cant pee more then a cup bull  Face still swollen   Best number [phone]

## 2015-03-27 ENCOUNTER — Emergency Department (HOSPITAL_COMMUNITY): Payer: Medicare Other

## 2015-03-27 ENCOUNTER — Encounter (HOSPITAL_COMMUNITY): Payer: Self-pay

## 2015-03-27 ENCOUNTER — Other Ambulatory Visit: Payer: Self-pay | Admitting: Internal Medicine

## 2015-03-27 ENCOUNTER — Inpatient Hospital Stay (HOSPITAL_COMMUNITY)
Admission: EM | Admit: 2015-03-27 | Discharge: 2015-03-30 | DRG: 683 | Disposition: A | Discharge status: Home / Self Care | Payer: Medicare Other | Attending: Internal Medicine | Admitting: Internal Medicine

## 2015-03-27 DIAGNOSIS — Z96641 Presence of right artificial hip joint: Secondary | ICD-10-CM | POA: Diagnosis not present

## 2015-03-27 DIAGNOSIS — Z8249 Family history of ischemic heart disease and other diseases of the circulatory system: Secondary | ICD-10-CM | POA: Diagnosis not present

## 2015-03-27 DIAGNOSIS — R7989 Other specified abnormal findings of blood chemistry: Secondary | ICD-10-CM | POA: Insufficient documentation

## 2015-03-27 DIAGNOSIS — E785 Hyperlipidemia, unspecified: Secondary | ICD-10-CM | POA: Diagnosis not present

## 2015-03-27 DIAGNOSIS — Z8679 Personal history of other diseases of the circulatory system: Secondary | ICD-10-CM | POA: Diagnosis present

## 2015-03-27 DIAGNOSIS — Z7902 Long term (current) use of antithrombotics/antiplatelets: Secondary | ICD-10-CM

## 2015-03-27 DIAGNOSIS — N183 Chronic kidney disease, stage 3 unspecified: Secondary | ICD-10-CM | POA: Diagnosis present

## 2015-03-27 DIAGNOSIS — T7840XS Allergy, unspecified, sequela: Secondary | ICD-10-CM | POA: Diagnosis not present

## 2015-03-27 DIAGNOSIS — K5900 Constipation, unspecified: Secondary | ICD-10-CM | POA: Diagnosis present

## 2015-03-27 DIAGNOSIS — Z87891 Personal history of nicotine dependence: Secondary | ICD-10-CM | POA: Diagnosis not present

## 2015-03-27 DIAGNOSIS — Z7982 Long term (current) use of aspirin: Secondary | ICD-10-CM | POA: Diagnosis not present

## 2015-03-27 DIAGNOSIS — N179 Acute kidney failure, unspecified: Secondary | ICD-10-CM | POA: Insufficient documentation

## 2015-03-27 DIAGNOSIS — E878 Other disorders of electrolyte and fluid balance, not elsewhere classified: Secondary | ICD-10-CM | POA: Diagnosis not present

## 2015-03-27 DIAGNOSIS — E871 Hypo-osmolality and hyponatremia: Secondary | ICD-10-CM | POA: Diagnosis not present

## 2015-03-27 DIAGNOSIS — I10 Essential (primary) hypertension: Secondary | ICD-10-CM | POA: Diagnosis present

## 2015-03-27 DIAGNOSIS — E860 Dehydration: Secondary | ICD-10-CM | POA: Diagnosis present

## 2015-03-27 DIAGNOSIS — I701 Atherosclerosis of renal artery: Secondary | ICD-10-CM | POA: Diagnosis present

## 2015-03-27 DIAGNOSIS — E876 Hypokalemia: Secondary | ICD-10-CM | POA: Diagnosis not present

## 2015-03-27 DIAGNOSIS — N170 Acute kidney failure with tubular necrosis: Secondary | ICD-10-CM

## 2015-03-27 DIAGNOSIS — I129 Hypertensive chronic kidney disease with stage 1 through stage 4 chronic kidney disease, or unspecified chronic kidney disease: Secondary | ICD-10-CM | POA: Diagnosis not present

## 2015-03-27 DIAGNOSIS — Z79899 Other long term (current) drug therapy: Secondary | ICD-10-CM

## 2015-03-27 DIAGNOSIS — K5909 Other constipation: Secondary | ICD-10-CM | POA: Diagnosis not present

## 2015-03-27 DIAGNOSIS — K219 Gastro-esophageal reflux disease without esophagitis: Secondary | ICD-10-CM | POA: Diagnosis present

## 2015-03-27 DIAGNOSIS — T7840XA Allergy, unspecified, initial encounter: Secondary | ICD-10-CM | POA: Diagnosis present

## 2015-03-27 LAB — COMPLETE METABOLIC PANEL WITH GFR
ALT: 17 U/L (ref 6–29)
AST: 29 U/L (ref 10–35)
Albumin: 3.9 g/dL (ref 3.6–5.1)
Alkaline Phosphatase: 47 U/L (ref 33–130)
BUN: 56 mg/dL — ABNORMAL HIGH (ref 7–25)
CO2: 20 mmol/L (ref 20–31)
Calcium: 8.7 mg/dL (ref 8.6–10.4)
Chloride: 86 mmol/L — ABNORMAL LOW (ref 98–110)
Creat: 4.29 mg/dL — ABNORMAL HIGH (ref 0.60–0.93)
GFR, Est African American: 11 mL/min — ABNORMAL LOW (ref >=60)
GFR, Est Non African American: 10 mL/min — ABNORMAL LOW (ref >=60)
Glucose, Bld: 133 mg/dL — ABNORMAL HIGH (ref 65–99)
Potassium: 4.1 mmol/L (ref 3.5–5.3)
Sodium: 123 mmol/L — ABNORMAL LOW (ref 135–146)
Total Bilirubin: 0.3 mg/dL (ref 0.2–1.2)
Total Protein: 5.9 g/dL — ABNORMAL LOW (ref 6.1–8.1)

## 2015-03-27 LAB — COMPREHENSIVE METABOLIC PANEL
ALK PHOS: 48 U/L (ref 38–126)
ALT: 22 U/L (ref 14–54)
ANION GAP: 13 (ref 5–15)
AST: 29 U/L (ref 15–41)
Albumin: 4.1 g/dL (ref 3.5–5.0)
BILIRUBIN TOTAL: 0.7 mg/dL (ref 0.3–1.2)
BUN: 65 mg/dL — ABNORMAL HIGH (ref 6–20)
CALCIUM: 9.6 mg/dL (ref 8.9–10.3)
CO2: 21 mmol/L — ABNORMAL LOW (ref 22–32)
Chloride: 93 mmol/L — ABNORMAL LOW (ref 101–111)
Creatinine, Ser: 4.18 mg/dL — ABNORMAL HIGH (ref 0.44–1.00)
GFR, EST AFRICAN AMERICAN: 11 mL/min — AB (ref >60)
GFR, EST NON AFRICAN AMERICAN: 10 mL/min — AB (ref >60)
Glucose, Bld: 98 mg/dL (ref 65–99)
Potassium: 3.4 mmol/L — ABNORMAL LOW (ref 3.5–5.1)
Sodium: 127 mmol/L — ABNORMAL LOW (ref 135–145)
TOTAL PROTEIN: 6.6 g/dL (ref 6.5–8.1)

## 2015-03-27 LAB — URINALYSIS, ROUTINE W REFLEX MICROSCOPIC
BILIRUBIN URINE: NEGATIVE
Glucose, UA: NEGATIVE mg/dL
Ketones, ur: NEGATIVE mg/dL
Leukocytes, UA: NEGATIVE
NITRITE: NEGATIVE
PH: 5.5 (ref 5.0–8.0)
Protein, ur: NEGATIVE mg/dL
SPECIFIC GRAVITY, URINE: 1.006 (ref 1.005–1.030)

## 2015-03-27 LAB — CBC WITH DIFFERENTIAL/PLATELET
Basophils Absolute: 0 10*3/uL (ref 0.0–0.1)
Basophils Relative: 0 %
EOS ABS: 0 10*3/uL (ref 0.0–0.7)
Eosinophils Relative: 0 %
HEMATOCRIT: 32.5 % — AB (ref 36.0–46.0)
HEMOGLOBIN: 11.4 g/dL — AB (ref 12.0–15.0)
LYMPHS ABS: 0.9 10*3/uL (ref 0.7–4.0)
Lymphocytes Relative: 10 %
MCH: 28.1 pg (ref 26.0–34.0)
MCHC: 35.1 g/dL (ref 30.0–36.0)
MCV: 80 fL (ref 78.0–100.0)
MONOS PCT: 8 %
Monocytes Absolute: 0.7 10*3/uL (ref 0.1–1.0)
NEUTROS ABS: 7.4 10*3/uL (ref 1.7–7.7)
NEUTROS PCT: 82 %
Platelets: 324 10*3/uL (ref 150–400)
RBC: 4.06 MIL/uL (ref 3.87–5.11)
RDW: 13 % (ref 11.5–15.5)
WBC: 9 10*3/uL (ref 4.0–10.5)

## 2015-03-27 LAB — URINE MICROSCOPIC-ADD ON
Bacteria, UA: NONE SEEN
Squamous Epithelial / LPF: NONE SEEN

## 2015-03-27 LAB — TSH: TSH: 0.242 u[IU]/mL — AB (ref 0.350–4.500)

## 2015-03-27 LAB — ANTISTREPTOLYSIN O TITER: ASO: 43 IU/mL (ref <409)

## 2015-03-27 MED ORDER — ACETAMINOPHEN 325 MG PO TABS: 650.0000 mg | ORAL_TABLET | Freq: Four times a day (QID) | ORAL | Status: DC | PRN

## 2015-03-27 MED ORDER — HYDROMORPHONE HCL 1 MG/ML IJ SOLN: 0.5000 mg | INTRAMUSCULAR | Status: DC | PRN

## 2015-03-27 MED ORDER — ONDANSETRON HCL 4 MG/2ML IJ SOLN: 4.0000 mg | Freq: Three times a day (TID) | INTRAMUSCULAR | Status: DC | PRN

## 2015-03-27 MED ORDER — PREDNISONE 5 MG PO TABS
10.0000 mg | ORAL_TABLET | Freq: Every day | ORAL | Status: DC
  Administered 2015-03-28 – 2015-03-30 (×3): 10 mg via ORAL
  Filled 2015-03-27 (×4): qty 2

## 2015-03-27 MED ORDER — SODIUM CHLORIDE 0.9 % IV BOLUS (SEPSIS)
500.0000 mL | Freq: Once | INTRAVENOUS | Status: AC
  Administered 2015-03-27: 500 mL via INTRAVENOUS

## 2015-03-27 MED ORDER — PREDNISONE 5 MG PO TABS: 10.0000 mg | ORAL_TABLET | Freq: Two times a day (BID) | ORAL | Status: DC

## 2015-03-27 MED ORDER — SODIUM CHLORIDE 0.9 % IV SOLN: INTRAVENOUS | Status: DC

## 2015-03-27 MED ORDER — LEVALBUTEROL HCL 0.63 MG/3ML IN NEBU: 0.6300 mg | INHALATION_SOLUTION | Freq: Four times a day (QID) | RESPIRATORY_TRACT | Status: DC | PRN

## 2015-03-27 MED ORDER — ONDANSETRON HCL 4 MG/2ML IJ SOLN: 4.0000 mg | Freq: Four times a day (QID) | INTRAMUSCULAR | Status: DC | PRN

## 2015-03-27 MED ORDER — SODIUM CHLORIDE 0.9% FLUSH
3.0000 mL | Freq: Two times a day (BID) | INTRAVENOUS | Status: DC
  Administered 2015-03-29 – 2015-03-30 (×2): 3 mL via INTRAVENOUS

## 2015-03-27 MED ORDER — ASPIRIN EC 81 MG PO TBEC
81.0000 mg | DELAYED_RELEASE_TABLET | Freq: Every day | ORAL | Status: DC
  Administered 2015-03-27 – 2015-03-30 (×4): 81 mg via ORAL
  Filled 2015-03-27 (×4): qty 1

## 2015-03-27 MED ORDER — ACETAMINOPHEN 650 MG RE SUPP: 650.0000 mg | Freq: Four times a day (QID) | RECTAL | Status: DC | PRN

## 2015-03-27 MED ORDER — CLOPIDOGREL BISULFATE 75 MG PO TABS
75.0000 mg | ORAL_TABLET | Freq: Every day | ORAL | Status: DC
  Administered 2015-03-27 – 2015-03-30 (×4): 75 mg via ORAL
  Filled 2015-03-27 (×4): qty 1

## 2015-03-27 MED ORDER — FLUTICASONE PROPIONATE 50 MCG/ACT NA SUSP: 2.0000 | Freq: Every day | NASAL | Status: DC | PRN

## 2015-03-27 MED ORDER — ROSUVASTATIN CALCIUM 5 MG PO TABS
5.0000 mg | ORAL_TABLET | Freq: Every day | ORAL | Status: DC
  Administered 2015-03-27 – 2015-03-30 (×4): 5 mg via ORAL
  Filled 2015-03-27 (×4): qty 1

## 2015-03-27 MED ORDER — PANTOPRAZOLE SODIUM 40 MG PO TBEC
40.0000 mg | DELAYED_RELEASE_TABLET | Freq: Every day | ORAL | Status: DC
Start: 2015-03-28 — End: 2015-03-30
  Administered 2015-03-28 – 2015-03-30 (×3): 40 mg via ORAL
  Filled 2015-03-27 (×3): qty 1

## 2015-03-27 MED ORDER — ONDANSETRON HCL 4 MG PO TABS
4.0000 mg | ORAL_TABLET | Freq: Four times a day (QID) | ORAL | Status: DC | PRN
Start: 2015-03-27 — End: 2015-03-30

## 2015-03-27 MED ORDER — SODIUM CHLORIDE 0.9 % IV SOLN
INTRAVENOUS | Status: DC
  Administered 2015-03-27: 19:00:00 via INTRAVENOUS
  Filled 2015-03-27 (×3): qty 1000

## 2015-03-27 MED ORDER — ENOXAPARIN SODIUM 30 MG/0.3ML ~~LOC~~ SOLN
30.0000 mg | SUBCUTANEOUS | Status: DC
  Administered 2015-03-27: 30 mg via SUBCUTANEOUS
  Filled 2015-03-27 (×2): qty 0.3

## 2015-03-27 NOTE — ED Provider Notes (Signed)
CSN: [id number]     Arrival date & time 03/27/15  1053 History   First MD Initiated Contact with Patient 03/27/15 1100     Chief Complaint  Patient presents with  . Dysuria     (Consider location/radiation/quality/duration/timing/severity/associated sxs/prior Treatment) HPI   Quenia Nuckols is a 75 year old female with history of left renal artery stenosis with stent, HLD, HTN, she was instructed by her PCP to come to the ER today to repeat labs and obtain a vascular study of left renal artery stent, to follow up on abnormal labs obtained yesterday.  The pt had elevated BUN/Cr and hyponatremia.  The pt was recently treated for UTI with Keflex, when she developed a generalized pruritic rash.   Her doctors instructed her to stop all her medications and prednisone was initiated.  Yesterday she did not feel well and laid around a lot, feeling slightly tired and weak.  She denies CP, SOB, abdominal pain, N, V, D, rash.  She was unable to urinate, and had decreased appetite, but tried to drink a lot of fluids to try and be able to urinate.  She did urinate four times overnight.  She denies feeling bloated in her abdomen.  She denies dysuria, hematuria, flank pain.   No other complaints.  She is requesting to eat and drink.  Past Medical History  Diagnosis Date  . Hyperlipidemia   . Hypertension   . Atrophic vaginitis   . SUI (stress urinary incontinence, female)   . GERD (gastroesophageal reflux disease)   . H/O hiatal hernia   . Arthritis   . 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 Renal artery stenosis ([place])   . 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 Allergy   . Cataract    Past Surgical History  Procedure Laterality Date  . Carpal tunnel release  1995    right  . Knee arthroscopy      BOTH KNEES  . Cataract extraction  20 yrs ago    both eyes   . Renal stint      RENAL STINT LEFT RENAL ARTERY IN 2005.  RENAL STINT REPLACE 03/2005.  . Bilateral capsulectomy  2008  .  Augmentation mammaplasty  1980     both breasts implants REMOVED IN 2005  . Total hip arthroplasty  11/11/2011    Procedure: TOTAL HIP ARTHROPLASTY ANTERIOR APPROACH;  Surgeon: Mcarthur Rossetti, MD;  Location: [facility];  Service: Orthopedics;  Laterality: Right;  Right Total Hip Arthroplasty  . Eye surgery     Family History  Problem Relation Age of Onset  . Mental illness Mother   . Breast cancer Mother 22  . Hypertension Mother   . Heart disease Father   . ALS Brother   . Stroke Brother    Social History  Substance Use Topics  . Smoking status: Former Smoker    Types: Cigarettes    Quit date: 07/27/1970  . Smokeless tobacco: Never Used  . Alcohol Use: 6.0 oz/week    10 Glasses of wine per week     Comment: 1-2 glasses of wine daily   OB History    Gravida Para Term Preterm AB TAB SAB Ectopic Multiple Living   1    1          Review of Systems  All other systems reviewed and are negative.     Allergies  Augmentin; Keflex; and Tamiflu  Home Medications   Prior to Admission medications  Medication Sig Start Date End Date Taking? Authorizing Provider  aspirin EC 81 MG tablet Take 81 mg by mouth daily.   Yes Historical Provider, MD  calcium carbonate (TUMS - DOSED IN MG ELEMENTAL CALCIUM) 500 MG chewable tablet Chew 1 tablet by mouth every 8 (eight) hours as needed for indigestion or heartburn.   Yes Historical Provider, MD  Cholecalciferol (VITAMIN D-3) 1000 units CAPS Take 2,000 Units by mouth daily.   Yes Historical Provider, MD  clopidogrel (PLAVIX) 75 MG tablet Take 75 mg by mouth daily. 02/16/15  Yes Historical Provider, MD  fexofenadine (ALLEGRA) 180 MG tablet Take 180 mg by mouth daily.   Yes Historical Provider, MD  fluticasone (FLONASE) 50 MCG/ACT nasal spray Place 2 sprays into both nostrils daily as needed for allergies or rhinitis.   Yes Historical Provider, MD  omeprazole (PRILOSEC) 20 MG capsule Take 20 mg by mouth 3 (three) times a week. 01/26/15  Yes  Historical Provider, MD  rosuvastatin (CRESTOR) 5 MG tablet Take 5 mg by mouth daily. 12/20/14  Yes Historical Provider, MD  triamterene-hydrochlorothiazide (MAXZIDE) 75-50 MG tablet Take 1 tablet by mouth daily. 02/16/15  Yes Historical Provider, MD  hydrALAZINE (APRESOLINE) 25 MG tablet Take 1 tablet (25 mg total) by mouth every 8 (eight) hours. 03/30/15   Lavina Hamman, MD  polyethylene glycol (MIRALAX / Floria Raveling) packet Take 17 g by mouth daily. 03/30/15   Lavina Hamman, MD   BP 137/65 mmHg  Pulse 46  Temp(Src) 98 F (36.7 C) (Oral)  Resp 18  Ht 5\' 1"  (1.549 m)  Wt 74.7 kg  BMI 31.13 kg/m2  SpO2 100% Physical Exam  Constitutional: She is oriented to person, place, and time. She appears well-developed and well-nourished. No distress.  HENT:  Head: Normocephalic and atraumatic.  Nose: Nose normal.  Mouth/Throat: Oropharynx is clear and moist. No oropharyngeal exudate.  Eyes: Conjunctivae and EOM are normal. Pupils are equal, round, and reactive to light. Right eye exhibits no discharge. Left eye exhibits no discharge. No scleral icterus.  Neck: Normal range of motion. No JVD present. No tracheal deviation present. No thyromegaly present.  Cardiovascular: Normal rate, regular rhythm, normal heart sounds and intact distal pulses.  Exam reveals no gallop and no friction rub.   No murmur heard. Pulmonary/Chest: Effort normal and breath sounds normal. No respiratory distress. She has no wheezes. She has no rales. She exhibits no tenderness.  Abdominal: Soft. Bowel sounds are normal. She exhibits no distension and no mass. There is no tenderness. There is no rebound and no guarding.  Musculoskeletal: Normal range of motion. She exhibits no edema or tenderness.  Lymphadenopathy:    She has no cervical adenopathy.  Neurological: She is alert and oriented to person, place, and time. She has normal reflexes. No cranial nerve deficit. She exhibits normal muscle tone. Coordination normal.  Skin:  Skin is warm and dry. No rash noted. She is not diaphoretic. No erythema. No pallor.  Psychiatric: She has a normal mood and affect. Her behavior is normal. Judgment and thought content normal.  Nursing note and vitals reviewed.   ED Course  Procedures (including critical care time) Labs Review Labs Reviewed  URINALYSIS, ROUTINE W REFLEX MICROSCOPIC (NOT AT [facility]) - Abnormal; Notable for the following:    Hgb urine dipstick TRACE (*)    All other components within normal limits  CBC WITH DIFFERENTIAL/PLATELET - Abnormal; Notable for the following:    Hemoglobin 11.4 (*)    HCT 32.5 (*)  All other components within normal limits  COMPREHENSIVE METABOLIC PANEL - Abnormal; Notable for the following:    Sodium 127 (*)    Potassium 3.4 (*)    Chloride 93 (*)    CO2 21 (*)    BUN 65 (*)    Creatinine, Ser 4.18 (*)    GFR calc non Af Amer 10 (*)    GFR calc Af Amer 11 (*)    All other components within normal limits  TSH - Abnormal; Notable for the following:    TSH 0.242 (*)    All other components within normal limits  COMPREHENSIVE METABOLIC PANEL - Abnormal; Notable for the following:    Sodium 133 (*)    CO2 21 (*)    BUN 65 (*)    Creatinine, Ser 3.34 (*)    Calcium 8.1 (*)    Total Protein 5.6 (*)    Albumin 3.3 (*)    GFR calc non Af Amer 13 (*)    GFR calc Af Amer 15 (*)    All other components within normal limits  CBC - Abnormal; Notable for the following:    RBC 3.76 (*)    Hemoglobin 10.7 (*)    HCT 30.5 (*)    All other components within normal limits  CBC WITH DIFFERENTIAL/PLATELET - Abnormal; Notable for the following:    RBC 3.64 (*)    Hemoglobin 10.1 (*)    HCT 30.2 (*)    All other components within normal limits  RENAL FUNCTION PANEL - Abnormal; Notable for the following:    Chloride 112 (*)    CO2 21 (*)    BUN 59 (*)    Creatinine, Ser 2.58 (*)    Calcium 8.2 (*)    Albumin 3.1 (*)    GFR calc non Af Amer 17 (*)    GFR calc Af Amer 20 (*)     All other components within normal limits  RENAL FUNCTION PANEL - Abnormal; Notable for the following:    BUN 46 (*)    Creatinine, Ser 2.05 (*)    Calcium 8.6 (*)    Albumin 3.4 (*)    GFR calc non Af Amer 23 (*)    GFR calc Af Amer 27 (*)    All other components within normal limits  URINE MICROSCOPIC-ADD ON    Imaging Review   US Renal (Final result) Result time: 03/27/15 12:37:18   Final result by Rad Results In Interface (03/27/15 12:37:18)   Narrative:   CLINICAL DATA: Acute renal failure  EXAM: RENAL / URINARY TRACT ULTRASOUND COMPLETE  COMPARISON: 05/10/2004  FINDINGS: Right Kidney:  Length: 10.5 cm. Echogenicity within normal limits. No mass or hydronephrosis visualized.  Left Kidney:  Length: 8 cm. There is mild increased cortical echogenicity and cortical thinning probable due to atrophy. No renal calculi.  Bladder:  Appears normal for degree of bladder distention.  IMPRESSION: 1. No hydronephrosis. Mild cortical thinning and increased echogenicity left kidney probable due to mild atrophy. No renal calculi bilaterally. Unremarkable under distended urinary bladder.   Electronically Signed By: Lahoma Crocker M.D. On: 03/27/2015 12:37   Renal Duplex:  Summary: Technically difficult and limited due to overlying bowel gas.  No areas of focal stenosis or occlusion identified in bilateral renal arteries. Left intrarenal indices are abnormal, however bilateral intrarenal flow is atypical with loss of diastolic component. Could not adequately visualize left proximal renal artery, however based on waveform characteristics in mid and distal renal artery, no  stenosis is suggested proximally. Other specific details can be found in the table(s) above. Prepared and Electronically Authenticated by  Jessy Oto. Fields MD 2017-02-05T12:29:56  No results found. I have personally reviewed and evaluated these images and lab results as part of my medical  decision-making.   EKG Interpretation None      MDM   Pt sent to the ER for renal artery study, hx of renal artery stenosis with stent x 2.  Labs from yesterday concerning for ARF, sent to ER for repeat labs and renal US.  Labs consistent with ARF with further elevation of BUN/Cr (65/4.18) from recent labs.  Pt also hyponatremic, hypochloremic.    Renal US negative for hydronephrosis, nephrolithiasis, right kidney normal in appearance, left kidney with mild increased cortical echogenicity and cortical thinning, suggestive of atrophy  Renal Duplex study limited by bowel gas, visualized mid to proximal left renal artery did not suggest proximal stenosis.  Pt was admitted for ARF by Dr. Allyson Sabal.   Pt's Nephrologist is Dr. Lorrene Reid, [facility]  Final diagnoses:  Azotemia  Acute renal failure, unspecified acute renal failure type [facility])      Delsa Grana, PA-C 04/03/15 Antares Liu, MD 04/03/15 [phone]

## 2015-03-27 NOTE — ED Notes (Signed)
Pt reports taking keflex for UTI Monday, started to have allergic reaction from it Wednesday and was put on prednisone.  Pt reports she was having hives.  She reports she started to have urinary retention Wednesday.  She also reports that her doctor instructed her to stop taking the rest of her meds including diuretic and HTN meds.  Pt reports facial swelling as well which is better today.  Pt denies any dysuria or hematuria at this time but reports that she was told today that her creatinine is increasing.  She was instructed to come to the ED today for a repeat.  Pt is A&Ox 4.  She reports she was able to urinate last night.

## 2015-03-27 NOTE — Telephone Encounter (Signed)
Dr. Carlean Jews  I believe you saw this Pt. Please see previous message

## 2015-03-27 NOTE — ED Notes (Signed)
Pt recently on keflex for UTI.  Pt had reaction and went on prednisone for rash.  Pt started having difficulty urinating and went back to md.  Elevated BUN/Creatinine.  Pt told to come here for repeat labs and ultrasound to check urinary stent.

## 2015-03-27 NOTE — ED Provider Notes (Signed)
Medical screening examination/treatment/procedure(s) were conducted as a shared visit with non-physician practitioner(s) and myself.  I personally evaluated the patient during the encounter.   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 Patient recently with dysuria and UTI and started on keflex. Shortly developed urticaria and pruritis during treatment and given prednisone with resolution. Seen by PCP one day ago for facial swelling, decreased urine output, decreased appetite, and N/V. On blood work with acute renal failure with Cr 4.29 (baseline 1.5). Not significantly fluid overloaded on exam today. Pt with soft BPs, but mentating well. Persistent ARF and hyponatremia. US renal showing poor visualized of proximal left renal artery but good waveforms and in the mid to distal left renal artery. No indications for dialysis at this time. Unclear of etiology as may be prerenal due to decreased PO and N/V recently. Given recent allergic reaction to keflex, ? Of possible interstitial nephritis/drug reaction. Discussed with [facility] and will admit for ongoing work-up and potential nephrology consult.  Forde Dandy, MD 03/27/15 [phone]

## 2015-03-27 NOTE — Progress Notes (Signed)
See history--creatinine doubled over 48 hr with development of hyponatremia in patient with acute urticaria from keflex who has a renal artery stent. I discussed with Dr [facility] who advises ER for urgent renal ultrasound and repeat labs--?ARF needing admission

## 2015-03-27 NOTE — Progress Notes (Signed)
*  PRELIMINARY RESULTS* Vascular Ultrasound Renal Artery Duplex has been completed.  Preliminary findings: Technically difficult and limited due to overlying bowel gas. No areas of focal stenosis or occlusion identified in bilateral renal arteries. Left intrarenal indices are abnormal, however bilateral intrarenal flow is atypical with loss of diastolic component. Could not adequately visualize left proximal renal artery, however based on waveform characteristics in mid and distal renal artery, no stenosis is suggested proximally.   Landry Mellow, RDMS, RVT  03/27/2015, 2:40 PM

## 2015-03-27 NOTE — H&P (Addendum)
[facility] History and Physical  Charlotte Duarte [id number] DOB: [birth year] DOA: 03/27/2015  Referring physician: PCP: Elby Showers, MD   Chief Complaint:  Worsening renal function   HPI:   75 year old female with a history of renal artery stenosis, chronic kidney disease stage III , recently treated with Keflex for urinary tract infection .She was found to have UTI on 03/14/15 due to  Klebsiella   and prescribed Keflex. Patient developed generalized rash and urticaria for 2 days and complained of generalized itching , was prescribed prednisone and her symptoms resolved. She was seen at [facility] on 03/26/15 and reported facial swelling , decreased urine output, some nausea vomiting , no fever , no flu like symptoms. She reported decrease in appetite , patient had repeat labs and her creatinine had increased from  2.35 on 1/31 to  4.29 on 2/2. Patient was advised to come to the ER for evaluation of her renal failure , hyponatremia with a sodium of 123 , patient is followed by Dr. Lorrene Reid at [facility] , who advised urgent visit to the ER for a renal ultrasound. Patient had a renal artery Doppler that did not show any focal stenosis , could not visualize left proximal renal artery.      Review of Systems: negative for the following  Constitutional: Positive for appetite change, appetite change and fatigue.  HEENT: Denies photophobia, eye pain, redness, hearing loss, ear pain, congestion, sore throat, rhinorrhea, sneezing, mouth sores, trouble swallowing, neck pain, neck stiffness and tinnitus.  Respiratory: Denies SOB, DOE, cough, chest tightness, and wheezing.  Cardiovascular: Denies chest pain, palpitations and leg swelling.  Gastrointestinal: Positive for vomiting and abdominal distention, blood in stool and abdominal distention.  Genitourinary: Positive for difficulty urinating.  flank pain and difficulty urinating.  Musculoskeletal: Denies myalgias, back pain, joint  swelling, arthralgias and gait problem.  Skin: Denies pallor, rash and wound.  Neurological: Denies dizziness, seizures, syncope, weakness, light-headedness, numbness and headaches.  Hematological: Denies adenopathy. Easy bruising, personal or family bleeding history  Psychiatric/Behavioral: Denies suicidal ideation, mood changes, confusion, nervousness, sleep disturbance and agitation       Past Medical History  Diagnosis Date  . Hyperlipidemia   . Hypertension   . Atrophic vaginitis   . SUI (stress urinary incontinence, female)   . GERD (gastroesophageal reflux disease)   . H/O hiatal hernia   . Arthritis   . 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 Renal artery stenosis ([place])   . 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 Allergy   . Cataract      Past Surgical History  Procedure Laterality Date  . Carpal tunnel release  1995    right  . Knee arthroscopy      BOTH KNEES  . Cataract extraction  20 yrs ago    both eyes   . Renal stint      RENAL STINT LEFT RENAL ARTERY IN 2005.  RENAL STINT REPLACE 03/2005.  . Bilateral capsulectomy  2008  . Augmentation mammaplasty  1980     both breasts implants REMOVED IN 2005  . Total hip arthroplasty  11/11/2011    Procedure: TOTAL HIP ARTHROPLASTY ANTERIOR APPROACH;  Surgeon: Mcarthur Rossetti, MD;  Location: [facility];  Service: Orthopedics;  Laterality: Right;  Right Total Hip Arthroplasty  . Eye surgery        Social History:  reports that she quit smoking about 44 years ago. Her smoking use included Cigarettes. She  has never used smokeless tobacco. She reports that she drinks about 6.0 oz of alcohol per week. She reports that she does not use illicit drugs.    Allergies  Allergen Reactions  . Augmentin [Amoxicillin-Pot Clavulanate] Nausea And Vomiting    Has patient had a PCN reaction causing immediate rash, facial/tongue/throat swelling, SOB or lightheadedness with hypotension: No Has patient had a PCN  reaction causing severe rash involving mucus membranes or skin necrosis: No Has patient had a PCN reaction that required hospitalization: No Has patient had a PCN reaction occurring within the last 10 years: No If all of the above answers are "NO", then may proceed with Cephalosporin use.   Marland Kitchen Keflex [Cephalexin] Hives and Itching    "Uncontrollable itching"   . Tamiflu [Oseltamivir Phosphate] Nausea And Vomiting    Family History  Problem Relation Age of Onset  . Mental illness Mother   . Breast cancer Mother 73  . Hypertension Mother   . Heart disease Father   . ALS Brother   . Stroke Brother        Prior to Admission medications   Medication Sig Start Date End Date Taking? Authorizing Provider  aspirin EC 81 MG tablet Take 81 mg by mouth daily.   Yes Historical Provider, MD  benazepril (LOTENSIN) 10 MG tablet Take 10 mg by mouth daily. 02/16/15  Yes Historical Provider, MD  calcium carbonate (TUMS - DOSED IN MG ELEMENTAL CALCIUM) 500 MG chewable tablet Chew 1 tablet by mouth every 8 (eight) hours as needed for indigestion or heartburn.   Yes Historical Provider, MD  Cholecalciferol (VITAMIN D-3) 1000 units CAPS Take 2,000 Units by mouth daily.   Yes Historical Provider, MD  clopidogrel (PLAVIX) 75 MG tablet Take 75 mg by mouth daily. 02/16/15  Yes Historical Provider, MD  fexofenadine (ALLEGRA) 180 MG tablet Take 180 mg by mouth daily.   Yes Historical Provider, MD  fluticasone (FLONASE) 50 MCG/ACT nasal spray Place 2 sprays into both nostrils daily as needed for allergies or rhinitis.   Yes Historical Provider, MD  omeprazole (PRILOSEC) 20 MG capsule Take 20 mg by mouth 3 (three) times a week. 01/26/15  Yes Historical Provider, MD  predniSONE (DELTASONE) 20 MG tablet 4/3/3/2/2/1/1 single daily dose for 7 days Patient taking differently: Take 20 mg by mouth daily with breakfast.  03/24/15  Yes Leandrew Koyanagi, MD  rosuvastatin (CRESTOR) 5 MG tablet Take 5 mg by mouth daily.  12/20/14  Yes Historical Provider, MD  triamterene-hydrochlorothiazide (MAXZIDE) 75-50 MG tablet Take 1 tablet by mouth daily. 02/16/15  Yes Historical Provider, MD     Physical Exam: Filed Vitals:   03/27/15 1330 03/27/15 1400 03/27/15 1430 03/27/15 1544  BP: 105/55 116/57 130/43 103/54  Pulse: 44 53 49 61  Temp:      TempSrc:      Resp:    16  SpO2: 95% 100% 99% 98%     Constitutional: Vital signs reviewed. Patient is a well-developed and well-nourished in no acute distress and cooperative with exam. Alert and oriented x3.  Head: Normocephalic and atraumatic  Ear: TM normal bilaterally  Mouth: no erythema or exudates, MMM  Eyes: PERRL, EOMI, conjunctivae normal, No scleral icterus.  Neck: Supple, Trachea midline normal ROM, No JVD, mass, thyromegaly, or carotid bruit present.  Cardiovascular: RRR, S1 normal, S2 normal, no MRG, pulses symmetric and intact bilaterally  Pulmonary/Chest: CTAB, no wheezes, rales, or rhonchi  Abdominal: Soft. Non-tender, non-distended, bowel sounds are normal, no masses, organomegaly,  or guarding present.  GU: no CVA tenderness Musculoskeletal: No joint deformities, erythema, or stiffness, ROM full and no nontender EXTREMITIES: pulses normal, full ROM. Trace edema in the lower extremities. Hematology: no cervical, inginal, or axillary adenopathy.  Neurological: A&O x3, Strenght is normal and symmetric bilaterally, cranial nerve II-XII are grossly intact, no focal motor deficit, sensory intact to light touch bilaterally.  Skin: Warm, dry and intact. No rash, cyanosis, or clubbing.  Psychiatric: Normal mood and affect. speech and behavior is normal. Judgment and thought content normal. Cognition and memory are normal.      Data Review   Micro Results No results found for this or any previous visit (from the past 240 hour(s)).  Radiology Reports US Renal  03/27/2015  CLINICAL DATA:  Acute renal failure EXAM: RENAL / URINARY TRACT ULTRASOUND  COMPLETE COMPARISON:  05/10/2004 FINDINGS: Right Kidney: Length: 10.5 cm. Echogenicity within normal limits. No mass or hydronephrosis visualized. Left Kidney: Length: 8 cm. There is mild increased cortical echogenicity and cortical thinning probable due to atrophy. No renal calculi. Bladder: Appears normal for degree of bladder distention. IMPRESSION: 1. No hydronephrosis. Mild cortical thinning and increased echogenicity left kidney probable due to mild atrophy. No renal calculi bilaterally. Unremarkable under distended urinary bladder. Electronically Signed   By: Lahoma Crocker M.D.   On: 03/27/2015 12:37     CBC  Recent Labs Lab 03/24/15 1814 03/26/15 1426 03/27/15 1121  WBC 7.7 7.4 9.0  HGB 13.7 11.4* 11.4*  HCT 41.4 32.5* 32.5*  PLT  --   --  324  MCV 82.7 80.6 80.0  MCH 27.4 28.3 28.1  MCHC 33.1 35.1 35.1  RDW  --   --  13.0  LYMPHSABS  --   --  0.9  MONOABS  --   --  0.7  EOSABS  --   --  0.0  BASOSABS  --   --  0.0    Chemistries   Recent Labs Lab 03/24/15 1805 03/26/15 1405 03/27/15 1121  NA 133* 123* 127*  K 3.6 4.1 3.4*  CL 96* 86* 93*  CO2 21 20 21*  GLUCOSE 132* 133* 98  BUN 41* 56* 65*  CREATININE 2.35* 4.29* 4.18*  CALCIUM 9.4 8.7 9.6  AST 31 29 29   ALT 21 17 22   ALKPHOS 52 47 48  BILITOT 0.7 0.3 0.7   ------------------------------------------------------------------------------------------------------------------ estimated creatinine clearance is 11.6 mL/min (by C-G formula based on Cr of 4.18). ------------------------------------------------------------------------------------------------------------------ No results for input(s): HGBA1C in the last 72 hours. ------------------------------------------------------------------------------------------------------------------ No results for input(s): CHOL, HDL, LDLCALC, TRIG, CHOLHDL, LDLDIRECT in the last 72  hours. ------------------------------------------------------------------------------------------------------------------ No results for input(s): TSH, T4TOTAL, T3FREE, THYROIDAB in the last 72 hours.  Invalid input(s): FREET3 ------------------------------------------------------------------------------------------------------------------ No results for input(s): VITAMINB12, FOLATE, FERRITIN, TIBC, IRON, RETICCTPCT in the last 72 hours.  Coagulation profile No results for input(s): INR, PROTIME in the last 168 hours.  No results for input(s): DDIMER in the last 72 hours.  Cardiac Enzymes No results for input(s): CKMB, TROPONINI, MYOGLOBIN in the last 168 hours.  Invalid input(s): CK ------------------------------------------------------------------------------------------------------------------ Invalid input(s): POCBNP   CBG: No results for input(s): GLUCAP in the last 168 hours.     EKG: Independently reviewed.     Assessment/Plan Principal Problem:   Acute renal failure (ARF) (HCC)  possibly prerenal secondary  To poor oral intake , in the setting of ACE inhibitor use, patient is also on Maxzide , blood pressure is soft in the low 100s  Hydrated aggressively with  IV fluids   arterial Doppler within normal limits  Repeat renal function tomorrow , if no improvement consult nephrology , patient is followed by Dr. Lorrene Reid     RENAL DISEASE, CHRONIC, STAGE III -baseline creatinine 1.5     Hypertension -hold all at her hypertensive medications in the setting of low blood pressure and renal failure    Hyperlipidemia -continue statin    History of renal artery stents-renal artery stenosis status post left renal artery PTCA and stenting in 2005 with PCI of an in-stent stenosis in 2007. Takes Plavix for the renal artery stenosis history, patient is on aspirin and Plavix which will be continued.Could not adequately visualize left proximal renal artery on renal Doppler done today  and therefore will order MRA of the renal artery for tomorrow    gastroesophageal reflux disease -patient will be continued on PPI   recent urinary tract infection , repeat UA negative, hold off on antibiotics at this time   hyponatremia- likely in the setting of Maxide use as well as dehydration, hopefully should improve with IV hydration    Hypokalemia replete     Code Status Orders        Start     Ordered   03/27/15 1601  Full code   Continuous     03/27/15 1602     Family Communication: bedside Disposition Plan: admit   Total time spent 55 minutes.Greater than 50% of this time was spent in counseling, explanation of diagnosis, planning of further management, and coordination of care  [facility] Pager [phone]  If 7PM-7AM, please contact night-coverage [email or web address] Password [email or web address] 03/27/2015, 4:03 PM

## 2015-03-27 NOTE — ED Notes (Signed)
Patient transported to Ultrasound.  [place] at bedside

## 2015-03-28 ENCOUNTER — Inpatient Hospital Stay (HOSPITAL_COMMUNITY): Payer: Medicare Other

## 2015-03-28 DIAGNOSIS — E785 Hyperlipidemia, unspecified: Secondary | ICD-10-CM

## 2015-03-28 DIAGNOSIS — I10 Essential (primary) hypertension: Secondary | ICD-10-CM

## 2015-03-28 DIAGNOSIS — N189 Chronic kidney disease, unspecified: Secondary | ICD-10-CM

## 2015-03-28 DIAGNOSIS — N179 Acute kidney failure, unspecified: Principal | ICD-10-CM

## 2015-03-28 DIAGNOSIS — I701 Atherosclerosis of renal artery: Secondary | ICD-10-CM

## 2015-03-28 LAB — COMPREHENSIVE METABOLIC PANEL
ALBUMIN: 3.3 g/dL — AB (ref 3.5–5.0)
ALT: 19 U/L (ref 14–54)
AST: 23 U/L (ref 15–41)
Alkaline Phosphatase: 40 U/L (ref 38–126)
Anion gap: 8 (ref 5–15)
BILIRUBIN TOTAL: 0.3 mg/dL (ref 0.3–1.2)
BUN: 65 mg/dL — AB (ref 6–20)
CHLORIDE: 104 mmol/L (ref 101–111)
CO2: 21 mmol/L — ABNORMAL LOW (ref 22–32)
CREATININE: 3.34 mg/dL — AB (ref 0.44–1.00)
Calcium: 8.1 mg/dL — ABNORMAL LOW (ref 8.9–10.3)
GFR calc Af Amer: 15 mL/min — ABNORMAL LOW (ref >60)
GFR, EST NON AFRICAN AMERICAN: 13 mL/min — AB (ref >60)
GLUCOSE: 78 mg/dL (ref 65–99)
POTASSIUM: 4.1 mmol/L (ref 3.5–5.1)
Sodium: 133 mmol/L — ABNORMAL LOW (ref 135–145)
Total Protein: 5.6 g/dL — ABNORMAL LOW (ref 6.5–8.1)

## 2015-03-28 LAB — CBC
HEMATOCRIT: 30.5 % — AB (ref 36.0–46.0)
Hemoglobin: 10.7 g/dL — ABNORMAL LOW (ref 12.0–15.0)
MCH: 28.5 pg (ref 26.0–34.0)
MCHC: 35.1 g/dL (ref 30.0–36.0)
MCV: 81.1 fL (ref 78.0–100.0)
Platelets: 296 10*3/uL (ref 150–400)
RBC: 3.76 MIL/uL — ABNORMAL LOW (ref 3.87–5.11)
RDW: 13.3 % (ref 11.5–15.5)
WBC: 8.1 10*3/uL (ref 4.0–10.5)

## 2015-03-28 MED ORDER — SODIUM CHLORIDE 0.9 % IV SOLN
INTRAVENOUS | Status: DC
  Administered 2015-03-28 – 2015-03-29 (×2): via INTRAVENOUS

## 2015-03-28 MED ORDER — SENNOSIDES-DOCUSATE SODIUM 8.6-50 MG PO TABS
1.0000 | ORAL_TABLET | Freq: Two times a day (BID) | ORAL | Status: DC
Start: 2015-03-28 — End: 2015-03-30
  Administered 2015-03-28 – 2015-03-30 (×4): 1 via ORAL
  Filled 2015-03-28 (×5): qty 1

## 2015-03-28 MED ORDER — POLYETHYLENE GLYCOL 3350 17 G PO PACK
17.0000 g | PACK | Freq: Every day | ORAL | Status: DC
  Administered 2015-03-28 – 2015-03-30 (×3): 17 g via ORAL
  Filled 2015-03-28 (×3): qty 1

## 2015-03-28 MED ORDER — HEPARIN SODIUM (PORCINE) 5000 UNIT/ML IJ SOLN
5000.0000 [IU] | Freq: Three times a day (TID) | INTRAMUSCULAR | Status: DC
  Administered 2015-03-28 – 2015-03-30 (×5): 5000 [IU] via SUBCUTANEOUS
  Filled 2015-03-28 (×5): qty 1

## 2015-03-28 NOTE — Progress Notes (Signed)
[facility] Progress Note  Patient: Charlotte Duarte [id number]   PCP: Elby Showers, MD DOB: [birth year]   DOA: 03/27/2015   DOS: 03/28/2015   Date of Service: the patient was seen and examined on 03/28/2015  Subjective: Mentions she is feeling better no nausea no vomiting. Complains of constipation. No diarrhea. Nutrition: Tolerating oral diet Activity: Ambulating in the room Last BM: Prior to arrival  Assessment and Plan: 1. Acute renal failure (ARF) ([place]) Patient presents with complaints of abnormal lab she is found to have acute on chronic kidney disease. Baseline serum creatinine 1.5. She has bilateral renal artery stenosis with stenting done on the left. She has recurrent stenosis of the left artery in the past. Patient was recently treated with antibiotics followed by Benadryl and Pepcid due to allergic reaction. Patient was also on ARB as well as Diuril take and was hypotensive on arrival. Thus the patient has multiple reasons for worsening of her renal function. Currently her serum creatinine is improving significantly she does not have any evidence of volume overload and she is urinating well. We will continue to monitor her renal function. MRA is showing lack of signal in the left renal artery concerning for a possible occlusion. Since the patient is hemodynamically stable will discuss with vascular surgery over the phone and continue to hydrate the patient.  2. Constipation. Patient will be placed on bowel regimen.   Essential hypertension. Blood pressure was initially low her on admission currently stable continue to hydrate the patient and avoid her home medications.  Hyponatremia. Currently stable and improving. We will continue to monitor the patient most likely this is due to poor oral intake and dehydration  DVT Prophylaxis: subcutaneous Heparin Nutrition: Renal diet Advance goals of care discussion: Full code  Brief Summary of Hospitalization:  HPI: As per the H  and P dictated on admission, "75 year old female with a history of renal artery stenosis, chronic kidney disease stage III , recently treated with Keflex for urinary tract infection Found on urine analysis on 03/14/15 the patient was found to have Klebsiella UTI and prescribed Keflex. Patient developed generalized rash and urticaria for 2 days and complaining of generalized itching , was prescribed prednisone and her symptoms resolved. She was seen by her PCP on 03/26/15 and reported facial swelling , decreased urine output, some nausea vomiting , no fever , no flu like symptoms. She reported decrease in appetite , patient had repeat labs and her creatinine had increased from 2.35 on 1/31 to 4.29 on 2/2. Patient was advised to come to the ER for evaluation of her renal failure , hyponatremia with a sodium of 123 , patient is followed by Dr. Lorrene Reid at [facility] , who advised urgent visit to the ER for a renal ultrasound. Patient had a renal artery Doppler that did not show any focal stenosis , could not visualize left proximal renal artery."  Daily update, Procedures: none Consultants: none Antibiotics: Anti-infectives    None      Family Communication: no family was present at bedside, at the time of interview.   Disposition:  Expected discharge date: 03/31/2015 Barriers to safe discharge: Improvement in renal function and further workup   Intake/Output Summary (Last 24 hours) at 03/28/15 1738 Last data filed at 03/28/15 1545  Gross per 24 hour  Intake 2719.92 ml  Output   3100 ml  Net -380.08 ml   Filed Weights   03/27/15 1656  Weight: 74.7 kg (164 lb 10.9 oz)  Objective: Physical Exam: Filed Vitals:   03/27/15 1656 03/27/15 2130 03/28/15 0525 03/28/15 1423  BP: 95/59 101/66 137/65 121/60  Pulse: 66 68 60 65  Temp: 97.7 F (36.5 C) 97.9 F (36.6 C) 98.1 F (36.7 C) 98 F (36.7 C)  TempSrc: Oral Oral Oral Oral  Resp: 18 20 18 18   Height: 5\' 1"  (1.549 m)      Weight: 74.7 kg (164 lb 10.9 oz)     SpO2: 100% 99% 100% 100%     General: Appear in no distress, no Rash; Oral Mucosa moist. Cardiovascular: S1 and S2 Present, no Murmur, no JVD Respiratory: Bilateral Air entry present and Clear to Auscultation, no Crackles, no wheezes Abdomen: Bowel Sound present, Soft and no tenderness Extremities: no Pedal edema, no calf tenderness Neurology: Grossly no focal neuro deficit.  Data Reviewed: CBC:  Recent Labs Lab 03/24/15 1814 03/26/15 1426 03/27/15 1121 03/28/15 0536  WBC 7.7 7.4 9.0 8.1  NEUTROABS  --   --  7.4  --   HGB 13.7 11.4* 11.4* 10.7*  HCT 41.4 32.5* 32.5* 30.5*  MCV 82.7 80.6 80.0 81.1  PLT  --   --  324 0000000   Basic Metabolic Panel:  Recent Labs Lab 03/24/15 1805 03/26/15 1405 03/27/15 1121 03/28/15 0536  NA 133* 123* 127* 133*  K 3.6 4.1 3.4* 4.1  CL 96* 86* 93* 104  CO2 21 20 21* 21*  GLUCOSE 132* 133* 98 78  BUN 41* 56* 65* 65*  CREATININE 2.35* 4.29* 4.18* 3.34*  CALCIUM 9.4 8.7 9.6 8.1*   Liver Function Tests:  Recent Labs Lab 03/24/15 1805 03/26/15 1405 03/27/15 1121 03/28/15 0536  AST 31 29 29 23   ALT 21 17 22 19   ALKPHOS 52 47 48 40  BILITOT 0.7 0.3 0.7 0.3  PROT 6.1 5.9* 6.6 5.6*  ALBUMIN 4.0 3.9 4.1 3.3*   No results for input(s): LIPASE, AMYLASE in the last 168 hours. No results for input(s): AMMONIA in the last 168 hours.  Cardiac Enzymes: No results for input(s): CKTOTAL, CKMB, CKMBINDEX, TROPONINI in the last 168 hours.  BNP (last 3 results) No results for input(s): BNP in the last 8760 hours.  CBG: No results for input(s): GLUCAP in the last 168 hours.  Recent Results (from the past 240 hour(s))  Urine culture     Status: None (Preliminary result)   Collection Time: 03/26/15  2:06 PM  Result Value Ref Range Status   [place] Count 35,000 COLONIES/ML  Preliminary     Studies: Mr Jodene Nam Abdomen Wo Contrast  03/28/2015  CLINICAL DATA:  75 year old with history of renal artery  stenosis and left renal artery stenting. Chronic kidney disease, stage III. Worsening renal function. EXAM: MRA ABDOMEN WITHOUT CONTRAST TECHNIQUE: Angiographic images of the chest were obtained using MRA technique without intravenous contrast. CONTRAST:  None COMPARISON:  Renal arteriogram 05/22/2003.  Abdominal MRA 05/05/2003 FINDINGS: The celiac trunk and proximal SMA are patent. There is artifact from the left renal artery stent and left renal artery cannot be accurately evaluated on this non contrast examination. There is no significant signal in the left renal artery distal to the stent which raises concern for hemodynamically significant stenosis or even an occlusion. Left kidney is smaller than the right kidney. No gross abnormality to the adrenal glands, spleen or pancreas. No gross abnormality to the gallbladder. There is a lobulated T2 bright structure in the central aspect of the liver. This likely represents a hepatic cyst measuring up to 2.8  cm. There is punctate cyst at the hepatic dome. No large pleural effusions. IMPRESSION: Right renal artery is patent without evidence for stenosis. Limited evaluation of the left renal artery due to artifact from the implanted stent. No significant signal identified in the left renal artery distal to the stent which raises concern for a high-grade stenosis and cannot exclude an occlusion. In addition, there is cortical atrophy in the left kidney that could be associated with chronic renal artery stenosis. Left renal artery could be further characterized with a renal duplex examination or catheter angiogram with carbon dioxide. Probable hepatic cysts. Electronically Signed   By: Markus Daft M.D.   On: 03/28/2015 11:14     Scheduled Meds: . aspirin EC  81 mg Oral Daily  . clopidogrel  75 mg Oral Daily  . heparin subcutaneous  5,000 Units Subcutaneous 3 times per day  . pantoprazole  40 mg Oral Daily  . polyethylene glycol  17 g Oral Daily  . predniSONE  10 mg  Oral Q breakfast  . rosuvastatin  5 mg Oral Daily  . senna-docusate  1 tablet Oral BID  . sodium chloride flush  3 mL Intravenous Q12H   Continuous Infusions: . sodium chloride 75 mL/hr at 03/28/15 1221   PRN Meds: acetaminophen **OR** acetaminophen, fluticasone, HYDROmorphone (DILAUDID) injection, levalbuterol, ondansetron **OR** ondansetron (ZOFRAN) IV  Time spent: 30 minutes  Author: Berle Mull, MD [facility] Pager: [phone] 03/28/2015 5:38 PM  If 7PM-7AM, please contact night-coverage at [email or web address], password [facility]

## 2015-03-28 NOTE — Telephone Encounter (Signed)
Make sure patient has been seen by urology and has a plan for her kidney problem

## 2015-03-29 DIAGNOSIS — N183 Chronic kidney disease, stage 3 unspecified: Secondary | ICD-10-CM | POA: Diagnosis present

## 2015-03-29 DIAGNOSIS — K5909 Other constipation: Secondary | ICD-10-CM

## 2015-03-29 DIAGNOSIS — I10 Essential (primary) hypertension: Secondary | ICD-10-CM | POA: Diagnosis present

## 2015-03-29 DIAGNOSIS — T7840XS Allergy, unspecified, sequela: Secondary | ICD-10-CM

## 2015-03-29 DIAGNOSIS — K219 Gastro-esophageal reflux disease without esophagitis: Secondary | ICD-10-CM | POA: Diagnosis present

## 2015-03-29 DIAGNOSIS — E785 Hyperlipidemia, unspecified: Secondary | ICD-10-CM | POA: Diagnosis present

## 2015-03-29 DIAGNOSIS — Z87448 Personal history of other diseases of urinary system: Secondary | ICD-10-CM

## 2015-03-29 DIAGNOSIS — K5900 Constipation, unspecified: Secondary | ICD-10-CM | POA: Diagnosis present

## 2015-03-29 DIAGNOSIS — N179 Acute kidney failure, unspecified: Secondary | ICD-10-CM | POA: Diagnosis present

## 2015-03-29 DIAGNOSIS — T7840XA Allergy, unspecified, initial encounter: Secondary | ICD-10-CM | POA: Diagnosis present

## 2015-03-29 LAB — CBC WITH DIFFERENTIAL/PLATELET
BASOS PCT: 0 %
Basophils Absolute: 0 10*3/uL (ref 0.0–0.1)
EOS ABS: 0 10*3/uL (ref 0.0–0.7)
EOS PCT: 0 %
HCT: 30.2 % — ABNORMAL LOW (ref 36.0–46.0)
HEMOGLOBIN: 10.1 g/dL — AB (ref 12.0–15.0)
Lymphocytes Relative: 19 %
Lymphs Abs: 1.4 10*3/uL (ref 0.7–4.0)
MCH: 27.7 pg (ref 26.0–34.0)
MCHC: 33.4 g/dL (ref 30.0–36.0)
MCV: 83 fL (ref 78.0–100.0)
MONO ABS: 0.9 10*3/uL (ref 0.1–1.0)
MONOS PCT: 12 %
Neutro Abs: 4.9 10*3/uL (ref 1.7–7.7)
Neutrophils Relative %: 69 %
Platelets: 282 10*3/uL (ref 150–400)
RBC: 3.64 MIL/uL — ABNORMAL LOW (ref 3.87–5.11)
RDW: 13.7 % (ref 11.5–15.5)
WBC: 7.1 10*3/uL (ref 4.0–10.5)

## 2015-03-29 LAB — RENAL FUNCTION PANEL
ALBUMIN: 3.1 g/dL — AB (ref 3.5–5.0)
ANION GAP: 6 (ref 5–15)
BUN: 59 mg/dL — ABNORMAL HIGH (ref 6–20)
CALCIUM: 8.2 mg/dL — AB (ref 8.9–10.3)
CO2: 21 mmol/L — AB (ref 22–32)
Chloride: 112 mmol/L — ABNORMAL HIGH (ref 101–111)
Creatinine, Ser: 2.58 mg/dL — ABNORMAL HIGH (ref 0.44–1.00)
GFR calc non Af Amer: 17 mL/min — ABNORMAL LOW (ref >60)
GFR, EST AFRICAN AMERICAN: 20 mL/min — AB (ref >60)
Glucose, Bld: 97 mg/dL (ref 65–99)
PHOSPHORUS: 3.5 mg/dL (ref 2.5–4.6)
Potassium: 4.5 mmol/L (ref 3.5–5.1)
SODIUM: 139 mmol/L (ref 135–145)

## 2015-03-29 LAB — URINE CULTURE: Colony Count: 35000

## 2015-03-29 MED ORDER — HYDRALAZINE HCL 25 MG PO TABS
25.0000 mg | ORAL_TABLET | Freq: Three times a day (TID) | ORAL | Status: DC
  Administered 2015-03-29 – 2015-03-30 (×2): 25 mg via ORAL
  Filled 2015-03-29 (×3): qty 1

## 2015-03-29 NOTE — Progress Notes (Signed)
[facility] Progress Note  Patient: Charlotte Duarte [id number]   PCP: Elby Showers, MD DOB: [birth year]   DOA: 03/27/2015   DOS: 03/29/2015   Date of Service: the patient was seen and examined on 03/29/2015  Subjective: Does not have any acute complaints, did have a bowel movement yesterday. No nausea no vomiting. No abdominal pain. Blood pressure mildly elevated. Patient is concerned about IV fluids Nutrition: Tolerating oral diet Activity: Ambulating in the room Last BM: Prior to arrival  Assessment and Plan: 1. Acute renal failure superimposed on stage 3 chronic kidney disease ([place])  Left renal artery stenosis status post stenting 2005, 2007  Patient presents with complaints of abnormal lab she is found to have acute on chronic kidney disease. Baseline serum creatinine 1.5. She has bilateral renal artery stenosis with stenting done on the left. She has recurrent stenosis of the left artery in the past. Patient was recently treated with antibiotics followed by Benadryl and Pepcid due to allergic reaction. Patient was also on ARB as well as Diuril take and was hypotensive on arrival. Thus the patient has multiple reasons for worsening of her renal function. Currently her serum creatinine is improving significantly she does not have any evidence of volume overload and she is urinating well. We will continue to monitor her renal function. MRA is showing lack of signal in the left renal artery concerning for a possible occlusion. Appreciate input from vascular surgery and the patient will be scheduled for an outpatient follow-up with vascular surgery. In the meantime patient will be allowed to recover from this acute kidney injury. Due to aspirin and Plavix  2. Constipation. Patient will be placed on bowel regimen.resolved at preset   3.Essential hypertension. Blood pressure was initially low her on admission currently elevated. Patient was on 3 antihypertensive medications, one ACE  inhibitor and to diuretic. We will discontinue all 3 of them on discharge and transition the patient to hydralazine. Can also use amlodipine if the blood pressure is not controlled  4. Hyponatremia. Hyperchloremia Sodium Currently stable and improving. Mild elevation of chloride level. Discontinue IV fluids.  5. Recent allergic reaction. PCP started the patient on prednisone taper last dose tomorrow.  DVT Prophylaxis: subcutaneous Heparin Nutrition: Renal diet Advance goals of care discussion: Full code  Brief Summary of Hospitalization:  HPI: As per the H and P dictated on admission, "75 year old female with a history of renal artery stenosis, chronic kidney disease stage III , recently treated with Keflex for urinary tract infection Found on urine analysis on 03/14/15 the patient was found to have Klebsiella UTI and prescribed Keflex. Patient developed generalized rash and urticaria for 2 days and complaining of generalized itching , was prescribed prednisone and her symptoms resolved. She was seen by her PCP on 03/26/15 and reported facial swelling , decreased urine output, some nausea vomiting , no fever , no flu like symptoms. She reported decrease in appetite , patient had repeat labs and her creatinine had increased from 2.35 on 1/31 to 4.29 on 2/2. Patient was advised to come to the ER for evaluation of her renal failure , hyponatremia with a sodium of 123 , patient is followed by Dr. Lorrene [facility] at [facility] , who advised urgent visit to the ER for a renal ultrasound. Patient had a renal artery Doppler that did not show any focal stenosis , could not visualize left proximal renal artery."  Daily update, Procedures: none Consultants: none Antibiotics: Anti-infectives    None  Family Communication: no family was present at bedside, at the time of interview.   Disposition:  Expected discharge date: 03/30/2015 Barriers to safe discharge: Improvement in renal function  and further workup   Intake/Output Summary (Last 24 hours) at 03/29/15 1639 Last data filed at 03/29/15 1055  Gross per 24 hour  Intake 1217.5 ml  Output   1950 ml  Net -732.5 ml   Filed Weights   03/27/15 1656  Weight: 74.7 kg (164 lb 10.9 oz)    Objective: Physical Exam: Filed Vitals:   03/28/15 1423 03/28/15 2229 03/29/15 0544 03/29/15 1500  BP: 121/60 121/60 115/59 155/68  Pulse: 65 58 58 60  Temp: 98 F (36.7 C) 98.3 F (36.8 C) 97.7 F (36.5 C) 98 F (36.7 C)  TempSrc: Oral Oral Oral Oral  Resp: 18 18 16 18   Height:      Weight:      SpO2: 100% 98% 98% 100%    General: Appear in no distress, no Rash; Oral Mucosa moist. Cardiovascular: S1 and S2 Present, no Murmur, no JVD Respiratory: Bilateral Air entry present and Clear to Auscultation, no Crackles, no wheezes Abdomen: Bowel Sound present, Soft and no tenderness Extremities: no Pedal edema, no calf tenderness  Data Reviewed: CBC:  Recent Labs Lab 03/24/15 1814 03/26/15 1426 03/27/15 1121 03/28/15 0536 03/29/15 0543  WBC 7.7 7.4 9.0 8.1 7.1  NEUTROABS  --   --  7.4  --  4.9  HGB 13.7 11.4* 11.4* 10.7* 10.1*  HCT 41.4 32.5* 32.5* 30.5* 30.2*  MCV 82.7 80.6 80.0 81.1 83.0  PLT  --   --  324 296 [id number]   Basic Metabolic Panel:  Recent Labs Lab 03/24/15 1805 03/26/15 1405 03/27/15 1121 03/28/15 0536 03/29/15 0544  NA 133* 123* 127* 133* 139  K 3.6 4.1 3.4* 4.1 4.5  CL 96* 86* 93* 104 112*  CO2 21 20 21* 21* 21*  GLUCOSE 132* 133* 98 78 97  BUN 41* 56* 65* 65* 59*  CREATININE 2.35* 4.29* 4.18* 3.34* 2.58*  CALCIUM 9.4 8.7 9.6 8.1* 8.2*  PHOS  --   --   --   --  3.5   Liver Function Tests:  Recent Labs Lab 03/24/15 1805 03/26/15 1405 03/27/15 1121 03/28/15 0536 03/29/15 0544  AST 31 29 29 23   --   ALT 21 17 22 19   --   ALKPHOS 52 47 48 40  --   BILITOT 0.7 0.3 0.7 0.3  --   PROT 6.1 5.9* 6.6 5.6*  --   ALBUMIN 4.0 3.9 4.1 3.3* 3.1*   No results for input(s): LIPASE, AMYLASE in the  last 168 hours. No results for input(s): AMMONIA in the last 168 hours.  Cardiac Enzymes: No results for input(s): CKTOTAL, CKMB, CKMBINDEX, TROPONINI in the last 168 hours.  BNP (last 3 results) No results for input(s): BNP in the last 8760 hours.  CBG: No results for input(s): GLUCAP in the last 168 hours.  Recent Results (from the past 240 hour(s))  Urine culture     Status: None (Preliminary result)   Collection Time: 03/26/15  2:06 PM  Result Value Ref Range Status   Colony Count 35,000 COLONIES/ML  Preliminary    Studies: No results found.   Scheduled Meds: . aspirin EC  81 mg Oral Daily  . clopidogrel  75 mg Oral Daily  . heparin subcutaneous  5,000 Units Subcutaneous 3 times per day  . hydrALAZINE  25 mg Oral 3 times per  day  . pantoprazole  40 mg Oral Daily  . polyethylene glycol  17 g Oral Daily  . predniSONE  10 mg Oral Q breakfast  . rosuvastatin  5 mg Oral Daily  . senna-docusate  1 tablet Oral BID  . sodium chloride flush  3 mL Intravenous Q12H   Continuous Infusions:   PRN Meds: acetaminophen **OR** acetaminophen, fluticasone, HYDROmorphone (DILAUDID) injection, levalbuterol, ondansetron **OR** ondansetron (ZOFRAN) IV  Time spent: 30 minutes  Author: Berle Mull, MD [facility] Pager: [phone] 03/29/2015 4:39 PM  If 7PM-7AM, please contact night-coverage at [email or web address], password [facility]

## 2015-03-29 NOTE — Consult Note (Signed)
Pt discussed with Dr Posey Pronto.  Images of duplex/MRI and prior renal angio reviewed.  Pt currently with elevated creatinine.  She has evidence of either high grade left renal stenosis or occlusion with patent right renal artery.  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    I believe best option would be to allow pt to recover from this event and see her as outpt in a few weeks.  We will repeat her creatinine at that time as well as a renal duplex and based on her clinical situation determine if renal angio would be necessary.  Charlotte Hinds, MD [facility] Office: [phone] Pager: [phone]

## 2015-03-29 NOTE — Progress Notes (Signed)
Pt BP 155/68 HR 60. MD notified. Will continue to monitor.

## 2015-03-29 NOTE — Progress Notes (Signed)
Utilization Review Completed.Elica Almas T2/06/2015  

## 2015-03-30 ENCOUNTER — Other Ambulatory Visit: Payer: Self-pay | Admitting: *Deleted

## 2015-03-30 DIAGNOSIS — I701 Atherosclerosis of renal artery: Secondary | ICD-10-CM

## 2015-03-30 LAB — RENAL FUNCTION PANEL
ALBUMIN: 3.4 g/dL — AB (ref 3.5–5.0)
ANION GAP: 7 (ref 5–15)
BUN: 46 mg/dL — ABNORMAL HIGH (ref 6–20)
CALCIUM: 8.6 mg/dL — AB (ref 8.9–10.3)
CO2: 22 mmol/L (ref 22–32)
CREATININE: 2.05 mg/dL — AB (ref 0.44–1.00)
Chloride: 111 mmol/L (ref 101–111)
GFR calc non Af Amer: 23 mL/min — ABNORMAL LOW (ref >60)
GFR, EST AFRICAN AMERICAN: 27 mL/min — AB (ref >60)
GLUCOSE: 75 mg/dL (ref 65–99)
PHOSPHORUS: 3 mg/dL (ref 2.5–4.6)
Potassium: 4.2 mmol/L (ref 3.5–5.1)
SODIUM: 140 mmol/L (ref 135–145)

## 2015-03-30 MED ORDER — POLYETHYLENE GLYCOL 3350 17 G PO PACK: 17.0000 g | PACK | Freq: Every day | ORAL | Status: DC

## 2015-03-30 MED ORDER — TRIAMTERENE-HCTZ 75-50 MG PO TABS
1.0000 | ORAL_TABLET | Freq: Every day | ORAL | Status: DC
  Administered 2015-03-30: 1 via ORAL
  Filled 2015-03-30: qty 1

## 2015-03-30 MED ORDER — HYDRALAZINE HCL 25 MG PO TABS: 25.0000 mg | ORAL_TABLET | Freq: Three times a day (TID) | ORAL | Status: DC

## 2015-03-30 NOTE — Telephone Encounter (Signed)
LMVM  Requesting a call back to discuss her issues.

## 2015-03-31 NOTE — Telephone Encounter (Signed)
LM to call back.

## 2015-03-31 NOTE — Discharge Summary (Addendum)
[facility] Discharge Summary   Patient: Charlotte Duarte [id number]   PCP: Charlotte Showers, MD DOB: 01/28/1941   Date of admission: 03/27/2015   Date of discharge: 03/30/2015    Discharge Diagnoses:  Principal Problem:   Acute renal failure superimposed on stage 3 chronic kidney disease ([place]) Active Problems:   Hypertension   Hyperlipidemia   History of renal artery stenosis   Essential hypertension   Allergic reaction   Constipation   Dyslipidemia   GERD (gastroesophageal reflux disease)  Recommendations for Outpatient Follow-up:  1. Follow-up with nephrology as well as PCP, get a BMP in 1 week   Follow-up Information    Follow up with Charlotte Showers, MD.   Specialty:  Internal Medicine   Why:  with BMP   Contact information:   [address] [place] [phone]       Follow up with Charlotte B, MD. Call in 1 week.   Specialty:  Nephrology   Contact information:   [address] [place] [address] [phone]       Follow up with Charlotte Hinds, MD. Call in 1 week.   Specialties:  Vascular Surgery, Cardiology   Why:  to make appointment in 2 week.    Contact information:   [address] [place] [address] [phone]       Call Charlotte Duarte.   Why:  to set up an appointment for 30 day heart monitor.    Contact information:   [place] [phone]      Diet recommendation: Cardiac diet  Activity: The patient is advised to gradually reintroduce usual activities.  Discharge Condition: good  History of present illness: As per the H and P dictated on admission, "75 year old female with a history of renal artery stenosis, chronic kidney disease stage III , recently treated with Keflex for urinary tract infection Found on urine analysis on 03/14/15 the patient was found to have Klebsiella UTI and prescribed Keflex. Patient  developed generalized rash and urticaria for 2 days and complaining of generalized itching , was prescribed prednisone and her symptoms resolved. She was seen by her PCP on 03/26/15 and reported facial swelling , decreased urine output, some nausea vomiting , no fever , no flu like symptoms. She reported decrease in appetite , patient had repeat labs and her creatinine had increased from 2.35 on 1/31 to 4.29 on 2/2. Patient was advised to come to the ER for evaluation of her renal failure , hyponatremia with a sodium of 123 , patient is followed by Dr. Lorrene Reid at [facility] , who advised urgent visit to the ER for a renal ultrasound. Patient had a renal artery Doppler that did not show any focal stenosis , could not visualize left proximal renal artery. "  Hospital Course:  Summary of her active problems in the hospital is as following. 1. 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 Baseline serum creatinine 1.5. She has left renal artery stenosis with stenting. She has recurrent stenosis of the left artery in the past. Patient was recently treated with antibiotics and this was followed by Benadryl and Pepcid due to allergic reaction. Patient was also on ARB as well as Diuretic and was hypotensive on arrival. Thus the patient  has multiple reasons for worsening of her renal function. Agents blood pressure medications were kept on hold as well as she was given IV fluids. Which she had significant improvement in her renal function, urine output continues to remain adequate as well.  Due to prior history of left renal artery stenosis as well as inability to visualize the left renal artery on the ultrasound vascular Doppler, MRA abdomen was performed, which is showing lack of signal in the left renal  artery concerning for a possible occlusion. Appreciate input from vascular surgery and the patient will be scheduled for an outpatient follow-up with vascular surgery. In the meantime patient will be allowed to recover from this acute kidney injury. Due to aspirin and Plavix  2. Constipation. Patient will be placed on bowel regimen.resolved at preset  3.Essential hypertension. Blood pressure was initially low her on admission currently elevated. Patient was on 3 antihypertensive medications. Patient was started on hydralazine. Due to her leg swelling the patient was concerned about further volume overload, discussed with nephrology and they felt that since initial admission was secondary to GI symptoms which has currently resolved it would be safe to start patient back on her diuretics. Maxzide resumed on discharge  4. Hyponatremia. Hyperchloremia Sodium Currently stable and improving. Mild elevation of chloride level.   5. Recent allergic reaction. PCP started the patient on prednisone taper, last dose was on the day of the discharge.  All other chronic medical condition were stable during the hospitalization.  Patient was ambulatory without any assistance. On the day of the discharge the patient's creatinine improved, and no other acute medical condition were reported by patient. the patient was felt safe to be discharge at home with family.  Procedures and Results:  none   Consultations:  none  DISCHARGE MEDICATION: Discharge Medication List as of 03/30/2015 10:21 AM    START taking these medications   Details  hydrALAZINE (APRESOLINE) 25 MG tablet Take 1 tablet (25 mg total) by mouth every 8 (eight) hours., Starting 03/30/2015, Until Discontinued, Normal    polyethylene glycol (MIRALAX / GLYCOLAX) packet Take 17 g by mouth daily., Starting 03/30/2015, Until Discontinued, Normal      CONTINUE these medications which have NOT CHANGED   Details  aspirin EC 81 MG tablet Take 81  mg by mouth daily., Until Discontinued, Historical Med    calcium carbonate (TUMS - DOSED IN MG ELEMENTAL CALCIUM) 500 MG chewable tablet Chew 1 tablet by mouth every 8 (eight) hours as needed for indigestion or heartburn., Until Discontinued, Historical Med    Cholecalciferol (VITAMIN D-3) 1000 units CAPS Take 2,000 Units by mouth daily., Until Discontinued, Historical Med    clopidogrel (PLAVIX) 75 MG tablet Take 75 mg by mouth daily., Starting 02/16/2015, Until Discontinued, Historical Med    fexofenadine (ALLEGRA) 180 MG tablet Take 180 mg by mouth daily., Until Discontinued, Historical Med    fluticasone (FLONASE) 50 MCG/ACT nasal spray Place 2 sprays into both nostrils daily as needed for allergies or rhinitis., Until Discontinued, Historical Med    omeprazole (PRILOSEC) 20 MG capsule Take 20 mg by mouth 3 (three) times a week., Starting 01/26/2015, Until Discontinued, Historical Med    rosuvastatin (CRESTOR) 5 MG tablet Take 5 mg by mouth daily., Starting 12/20/2014, Until Discontinued, Historical Med    triamterene-hydrochlorothiazide (MAXZIDE) 75-50 MG tablet Take 1 tablet by mouth daily., Starting 02/16/2015, Until Discontinued, Historical Med      STOP taking these medications     benazepril (LOTENSIN) 10 MG  tablet      predniSONE (DELTASONE) 20 MG tablet        Allergies  Allergen Reactions  . Augmentin [Amoxicillin-Pot Clavulanate] Nausea And Vomiting    Has patient had a PCN reaction causing immediate rash, facial/tongue/throat swelling, SOB or lightheadedness with hypotension: No Has patient had a PCN reaction causing severe rash involving mucus membranes or skin necrosis: No Has patient had a PCN reaction that required hospitalization: No Has patient had a PCN reaction occurring within the last 10 years: No If all of the above answers are "NO", then may proceed with Cephalosporin use.   Marland Kitchen Keflex [Cephalexin] Hives and Itching    "Uncontrollable itching"   .  Tamiflu [Oseltamivir Phosphate] Nausea And Vomiting   Discharge Instructions    Diet - low sodium heart healthy    Complete by:  As directed      Discharge instructions    Complete by:  As directed   It is important that you read following instructions as well as go over your medication list with RN to help you understand your care after this hospitalization.  Discharge Instructions: Monitor daily weight  Please follow-up with PCP in one week with BMP  Please request your primary care physician to go over all Hospital Tests and Procedure/Radiological results at the follow up,  Please get all Hospital records sent to your PCP by signing hospital release before you go home.  You were cared for by a hospitalist during your hospital stay. If you have any questions about your discharge medications or the care you received while you were in the hospital after you are discharged, you can call the unit and ask to speak with the hospitalist on call if the hospitalist that took care of you is not available.  Once you are discharged, your primary care physician will handle any further medical issues. Please note that NO REFILLS for any discharge medications will be authorized once you are discharged, as it is imperative that you return to your primary care physician (or establish a relationship with a primary care physician if you do not have one) for your aftercare needs so that they can reassess your need for medications and monitor your lab values. You Must read complete instructions/literature along with all the possible adverse reactions/side effects for all the Medicines you take and that have been prescribed to you. Take any new Medicines after you have completely understood and accept all the possible adverse reactions/side effects. Wear Seat belts while driving.     Increase activity slowly    Complete by:  As directed           Discharge Exam: Filed Weights   03/27/15 1656  Weight: 74.7 kg  (164 lb 10.9 oz)   Filed Vitals:   03/29/15 2057 03/30/15 0612  BP: 119/56 137/65  Pulse: 62 46  Temp: 97.8 F (36.6 C) 98 F (36.7 C)  Resp: 18 18   General: Appear in mild distress, no Rash; Oral Mucosa moist. Cardiovascular: S1 and S2 Present, no Murmur, no JVD Respiratory: Bilateral Air entry present and Clear to Auscultation, no Crackles, no wheezes Abdomen: Bowel Sound present, Soft and no tenderness Extremities: bilateral Pedal edema, no calf tenderness Neurology: Grossly no focal neuro deficit.  The results of significant diagnostics from this hospitalization (including imaging, microbiology, ancillary and laboratory) are listed below for reference.    Significant Diagnostic Studies: Mr Jodene Nam Abdomen Wo Contrast  03/28/2015  CLINICAL DATA:  75 year old with history of  renal artery stenosis and left renal artery stenting. Chronic kidney disease, stage III. Worsening renal function. EXAM: MRA ABDOMEN WITHOUT CONTRAST TECHNIQUE: Angiographic images of the chest were obtained using MRA technique without intravenous contrast. CONTRAST:  None COMPARISON:  Renal arteriogram 05/22/2003.  Abdominal MRA 05/05/2003 FINDINGS: The celiac trunk and proximal SMA are patent. There is artifact from the left renal artery stent and left renal artery cannot be accurately evaluated on this non contrast examination. There is no significant signal in the left renal artery distal to the stent which raises concern for hemodynamically significant stenosis or even an occlusion. Left kidney is smaller than the right kidney. No gross abnormality to the adrenal glands, spleen or pancreas. No gross abnormality to the gallbladder. There is a lobulated T2 bright structure in the central aspect of the liver. This likely represents a hepatic cyst measuring up to 2.8 cm. There is punctate cyst at the hepatic dome. No large pleural effusions. IMPRESSION: Right renal artery is patent without evidence for stenosis. Limited  evaluation of the left renal artery due to artifact from the implanted stent. No significant signal identified in the left renal artery distal to the stent which raises concern for a high-grade stenosis and cannot exclude an occlusion. In addition, there is cortical atrophy in the left kidney that could be associated with chronic renal artery stenosis. Left renal artery could be further characterized with a renal duplex examination or catheter angiogram with carbon dioxide. Probable hepatic cysts. Electronically Signed   By: Markus Daft M.D.   On: 03/28/2015 11:14   US Renal  03/27/2015  CLINICAL DATA:  Acute renal failure EXAM: RENAL / URINARY TRACT ULTRASOUND COMPLETE COMPARISON:  05/10/2004 FINDINGS: Right Kidney: Length: 10.5 cm. Echogenicity within normal limits. No mass or hydronephrosis visualized. Left Kidney: Length: 8 cm. There is mild increased cortical echogenicity and cortical thinning probable due to atrophy. No renal calculi. Bladder: Appears normal for degree of bladder distention. IMPRESSION: 1. No hydronephrosis. Mild cortical thinning and increased echogenicity left kidney probable due to mild atrophy. No renal calculi bilaterally. Unremarkable under distended urinary bladder. Electronically Signed   By: Lahoma Crocker M.D.   On: 03/27/2015 12:37    Microbiology: Recent Results (from the past 240 hour(s))  Urine culture     Status: None   Collection Time: 03/26/15  2:06 PM  Result Value Ref Range Status   Culture ENTEROCOCCUS SPECIES  Final   Colony Count 35,000 COLONIES/ML  Final   Organism ID, Bacteria ENTEROCOCCUS SPECIES  Final      Susceptibility   Enterococcus species -  (no method available)    AMPICILLIN <=2 Sensitive     LEVOFLOXACIN 1 Sensitive     NITROFURANTOIN <=16 Sensitive     VANCOMYCIN 1 Sensitive     TETRACYCLINE <=1 Sensitive      Labs: CBC:  Recent Labs Lab 03/24/15 1814 03/26/15 1426 03/27/15 1121 03/28/15 0536 03/29/15 0543  WBC 7.7 7.4 9.0 8.1 7.1    NEUTROABS  --   --  7.4  --  4.9  HGB 13.7 11.4* 11.4* 10.7* 10.1*  HCT 41.4 32.5* 32.5* 30.5* 30.2*  MCV 82.7 80.6 80.0 81.1 83.0  PLT  --   --  324 296 [id number]   Basic Metabolic Panel:  Recent Labs Lab 03/26/15 1405 03/27/15 1121 03/28/15 0536 03/29/15 0544 03/30/15 0606  NA 123* 127* 133* 139 140  K 4.1 3.4* 4.1 4.5 4.2  CL 86* 93* 104 112* 111  CO2 20  21* 21* 21* 22  GLUCOSE 133* 98 78 97 75  BUN 56* 65* 65* 59* 46*  CREATININE 4.29* 4.18* 3.34* 2.58* 2.05*  CALCIUM 8.7 9.6 8.1* 8.2* 8.6*  PHOS  --   --   --  3.5 3.0   Liver Function Tests:  Recent Labs Lab 03/24/15 1805 03/26/15 1405 03/27/15 1121 03/28/15 0536 03/29/15 0544 03/30/15 0606  AST 31 29 29 23   --   --   ALT 21 17 22 19   --   --   ALKPHOS 52 47 48 40  --   --   BILITOT 0.7 0.3 0.7 0.3  --   --   PROT 6.1 5.9* 6.6 5.6*  --   --   ALBUMIN 4.0 3.9 4.1 3.3* 3.1* 3.4*   Time spent: 30 minutes  Signed:  Hesham Womac  [facility] 03/30/2015, 4:06 PM

## 2015-04-01 ENCOUNTER — Telehealth: Payer: Self-pay | Admitting: Internal Medicine

## 2015-04-01 NOTE — Telephone Encounter (Signed)
Dr Lorrene Reid called and advised that she will see patient this Friday, 04/03/15 and evaluate and treat s/p hospital D/C, 03/31/15.  Hospital discharge summary and documentation has been faxed to Dr. Sanda Klein attention.  Dr. Lorrene Reid will also f/u with Dr. Ruta Hinds on behalf of the patient for f/u as well.

## 2015-04-02 ENCOUNTER — Encounter: Payer: Self-pay | Admitting: Vascular Surgery

## 2015-04-02 ENCOUNTER — Telehealth: Payer: Self-pay | Admitting: Vascular Surgery

## 2015-04-02 NOTE — Telephone Encounter (Signed)
Spoke with Charlotte Duarte to inform. She is aware of appts.  BMET order has been sent to [facility]/Dr Lorrene Reid- pt has an appt on 04/03/15\  dpm

## 2015-04-02 NOTE — Telephone Encounter (Signed)
-----   Message from Mena Goes, RN sent at 03/30/2015 10:26 AM EST ----- Regarding: Schedule   ----- Message -----    From: Elam Dutch, MD    Sent: 03/29/2015   1:32 PM      To: [facility] Charge Pool  Referring Berle Mull  Pt needs appt with me Feb 16 with renal duplex that day or earlier that week.  She also needs a BMET that week before her appt on that morning before appt  Eval for recurrent renal artery stenosis  Ruta Hinds

## 2015-04-03 DIAGNOSIS — N179 Acute kidney failure, unspecified: Secondary | ICD-10-CM | POA: Diagnosis not present

## 2015-04-03 DIAGNOSIS — I129 Hypertensive chronic kidney disease with stage 1 through stage 4 chronic kidney disease, or unspecified chronic kidney disease: Secondary | ICD-10-CM | POA: Diagnosis not present

## 2015-04-03 DIAGNOSIS — Z862 Personal history of diseases of the blood and blood-forming organs and certain disorders involving the immune mechanism: Secondary | ICD-10-CM | POA: Diagnosis not present

## 2015-04-03 DIAGNOSIS — I701 Atherosclerosis of renal artery: Secondary | ICD-10-CM | POA: Diagnosis not present

## 2015-04-04 ENCOUNTER — Other Ambulatory Visit: Payer: Self-pay | Admitting: Internal Medicine

## 2015-04-04 MED ORDER — PREDNISONE 20 MG PO TABS: ORAL_TABLET | ORAL | Status: DC

## 2015-04-04 NOTE — Telephone Encounter (Signed)
Rash returning after hospitaliz Will use pred again as rec by dr Lorrene Reid

## 2015-04-07 ENCOUNTER — Ambulatory Visit (HOSPITAL_COMMUNITY)
Admission: RE | Admit: 2015-04-07 | Discharge: 2015-04-07 | Disposition: A | Discharge status: Home / Self Care | Payer: Medicare Other | Source: Ambulatory Visit | Attending: Vascular Surgery | Admitting: Vascular Surgery

## 2015-04-07 DIAGNOSIS — I701 Atherosclerosis of renal artery: Secondary | ICD-10-CM | POA: Diagnosis not present

## 2015-04-07 NOTE — Telephone Encounter (Signed)
Pt was admitted to the hospital on 2/3 with acute renal failure.  This should have been addressed during her visit there.

## 2015-04-09 ENCOUNTER — Encounter: Payer: Self-pay | Admitting: Vascular Surgery

## 2015-04-09 ENCOUNTER — Ambulatory Visit (INDEPENDENT_AMBULATORY_CARE_PROVIDER_SITE_OTHER): Payer: Medicare Other | Admitting: Vascular Surgery

## 2015-04-09 VITALS — BP 146/82 | HR 72 | Temp 98.4°F | Resp 16 | Ht 61.0 in | Wt 153.0 lb

## 2015-04-09 DIAGNOSIS — I701 Atherosclerosis of renal artery: Secondary | ICD-10-CM

## 2015-04-09 NOTE — Progress Notes (Signed)
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 female who had a UTI in January and was given Keflex for Klebsiella.  She developed a urticarial and maculopapular rash and received steroids.  She felt bad and had decreased intake and stopped voiding.  She was seen at Urgent Care and was found to have a creatinine of > 4.  She was hospitalized and by the time she was discharged, her creatinine was 2.5.  She did f/u with Dr. Lorrene Reid on 03/29/15 at which time her creatinine was down to 2.05.  She has had labs drawn since then on 04/03/15 and her creatinine was again improved to 1.67.  Her u/a at that time was also normal.  She has been monitoring her blood pressure for the past 7-10 days and it has been running from the [id number] systolic.  She states that she is now on Hydralazine 25mg  tid, which is difficult to comply with three times a day.  She continues to take prednisone.  She has to take daily Zyrtec with this for the itching, which has greatly improved.    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  She had a PTCA of an in stent stenosis in 2007 and was started on lifetime Plavix.  She has not used NSAIDS since 2012.   She does have a hx of well controlled HTN with meds.  She also has hyperlipidemia that is managed with a statin.  She takes a daily aspirin.  She does have Hepatitis B, which was lab acquired years ago.  She presents today for follow up to possible restenosis of her left renal artery stent.  She did have a renal artery duplex 04/07/15 and the results are below.    Past Medical History  Diagnosis Date  . Hyperlipidemia   . Hypertension   . Atrophic vaginitis   . SUI (stress urinary incontinence, female)   . GERD (gastroesophageal reflux disease)   . H/O hiatal hernia   . Arthritis   . 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 Renal artery  stenosis ([place])   . 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 Allergy   . Cataract     Past Surgical History  Procedure Laterality Date  . Carpal tunnel release  1995    right  . Knee arthroscopy      BOTH KNEES  . Cataract extraction  20 yrs ago    both eyes   . Renal stint      RENAL STINT LEFT RENAL ARTERY IN 2005.  RENAL STINT REPLACE 03/2005.  . Bilateral capsulectomy  2008  . Augmentation mammaplasty  1980     both breasts implants REMOVED IN 2005  . Total hip arthroplasty  11/11/2011    Procedure: TOTAL HIP ARTHROPLASTY ANTERIOR APPROACH;  Surgeon: Mcarthur Rossetti, MD;  Location: [facility];  Service: Orthopedics;  Laterality: Right;  Right Total Hip Arthroplasty  . Eye surgery      Allergies  Allergen Reactions  . Augmentin [Amoxicillin-Pot Clavulanate] Nausea And Vomiting    Has patient had a PCN reaction causing immediate rash, facial/tongue/throat swelling, SOB or lightheadedness with hypotension: No Has patient had a PCN reaction causing severe rash involving mucus membranes or skin necrosis: No Has patient had a PCN reaction that required hospitalization:  No Has patient had a PCN reaction occurring within the last 10 years: No If all of the above answers are "NO", then may proceed with Cephalosporin use.   Marland Kitchen Keflex [Cephalexin] Hives and Itching    "Uncontrollable itching"   . Tamiflu [Oseltamivir Phosphate] Nausea And Vomiting    Current Outpatient Prescriptions  Medication Sig Dispense Refill  . aspirin EC 81 MG tablet Take 81 mg by mouth daily.    . calcium carbonate (TUMS - DOSED IN MG ELEMENTAL CALCIUM) 500 MG chewable tablet Chew 1 tablet by mouth every 8 (eight) hours as needed for indigestion or heartburn.    . Cholecalciferol (VITAMIN D-3) 1000 units CAPS Take 2,000 Units by mouth daily.    . clopidogrel (PLAVIX) 75 MG tablet Take 75 mg by mouth daily.    . fexofenadine (ALLEGRA) 180 MG tablet Take 180 mg by mouth daily.    . fluticasone (FLONASE)  50 MCG/ACT nasal spray Place 2 sprays into both nostrils daily as needed for allergies or rhinitis.    . hydrALAZINE (APRESOLINE) 25 MG tablet Take 1 tablet (25 mg total) by mouth every 8 (eight) hours. 60 tablet 0  . omeprazole (PRILOSEC) 20 MG capsule Take 20 mg by mouth 3 (three) times a week.  5  . polyethylene glycol (MIRALAX / GLYCOLAX) packet Take 17 g by mouth daily. 14 each 0  . predniSONE (DELTASONE) 20 MG tablet 3/3/3/2/2/2/1/1/1/then 1/2tab for 4 days 20 tablet 0  . rosuvastatin (CRESTOR) 5 MG tablet Take 5 mg by mouth daily.    Marland Kitchen triamterene-hydrochlorothiazide (MAXZIDE) 75-50 MG tablet Take 1 tablet by mouth daily.     No current facility-administered medications for this visit.    Family History  Problem Relation Age of Onset  . Mental illness Mother   . Breast cancer Mother 58  . Hypertension Mother   . Heart disease Father   . ALS Brother   . Stroke Brother     Social History   Social History  . Marital Status: Married    Spouse Name: N/A  . Number of Children: N/A  . Years of Education: N/A   Occupational History  . Not on file.   Social History Main Topics  . Smoking status: Former Smoker    Types: Cigarettes    Quit date: 07/27/1970  . Smokeless tobacco: Never Used  . Alcohol Use: 6.0 oz/week    10 Glasses of wine per week     Comment: 1-2 glasses of wine daily  . Drug Use: No  . Sexual Activity: Yes    Birth Control/ Protection: Post-menopausal   Other Topics Concern  . Not on file   Social History Narrative     REVIEW OF SYSTEMS:   [X]  denotes positive finding, [ ]  denotes negative finding Cardiac  Comments:  Chest pain or chest pressure:    Shortness of breath upon exertion:    Short of breath when lying flat:    Irregular heart rhythm:        Vascular    Pain in calf, thigh, or hip brought on by ambulation:    Pain in feet at night that wakes you up from your sleep:     Blood clot in your veins:    Leg swelling:           Pulmonary    Oxygen at home:    Productive cough:     Wheezing:         Neurologic  Sudden weakness in arms or legs:     Sudden numbness in arms or legs:     Sudden onset of difficulty speaking or slurred speech:    Temporary loss of vision in one eye:     Problems with dizziness:         Gastrointestinal    Blood in stool:     Vomited blood:     GERD x       Genitourinary    Burning when urinating:     Recent UTI x Klebsiella   Blood in urine:        Psychiatric    Major depression:         Hematologic    Bleeding problems:    Problems with blood clotting too easily:        Skin    Rashes or ulcers: x Recent rash due to Keflex      Constitutional    Fever or chills:      PHYSICAL EXAMINATION:  Filed Vitals:   04/09/15 1400  BP: 146/82  Pulse: 72  Temp: 98.4 F (36.9 C)  Resp: 16    General:  WDWN in NAD; vital signs documented above Gait: Normal HENT: WNL, normocephalic Pulmonary: normal non-labored breathing , without Rales, rhonchi,  wheezing Cardiac: regular HR, without  Murmurs, rubs or gallops; without carotid bruits Abdomen: soft, NT, no masses Skin: without rashes Vascular Exam/Pulses:  Right Left  Radial 2+ (normal) 2+ (normal)  PT 2+ (normal) 2+ (normal)   Extremities: without ischemic changes, without Gangrene , without cellulitis; without open wounds;  Musculoskeletal: no muscle wasting or atrophy  Neurologic: A&O X 3;  No focal weakness or paresthesias are detected Psychiatric:  The pt has Normal affect.   Non-Invasive Vascular Imaging:   Renal artery duplex 04/07/15: 1.  Patent right renal artery without evidence of significant stenosis 2.  Evaluation of left renal artery is difficult due to prominent splenic vasculature.  The portions of the left renal artery that could be isolated appeared to be WNL; no turbulence is appreciated in the distal waveforms. 3.  Left kidney is small and echogenic with cortical thinning, which may  indicate kidney failure.  Pt meds includes: Statin:  Yes.   Beta Blocker:  No. Aspirin:  Yes.   ACEI:  No. ARB:  No. Other Antiplatelet/Anticoagulant:  Yes.   Plavix   ASSESSMENT/PLAN:: 75 y.o. female with hx of RAS with hx of left renal artery stenting in 2005 and restenosis with PTCA in 2007 who recently presented with AKI on CKD 3.   -pt's creatinine on 03/30/15 was 2.05-she did have a more recent creatinine drawn on 04/03/15, which was 1.67 and her u/a was also normal.   -she has been keeping record of her blood pressure over the past 7-10 days and has been running 130's-160's -Dr. Oneida Alar has offered her a CO2 angiogram to evaluate renal artery stenosis, however, if during the procedure he cannot visualize the renal artery, he may need to use contrast.  Despite the contrast being diluted and giving more IVF, it could cause worsening renal function.   -after long discussion with Dr. Oneida Alar, she does not want any contrast.   -She has an appointment to see Dr. Lorrene Reid in one month.  We will see her back in ~ 6 weeks with a creatinine to see if her renal function continues to improve.  -she does understand that by waiting, the window of opportunity could close to salvage the kidney  if it is not already occluded.     Leontine Locket, PA-C [facility] [phone]  Clinic MD:  Pt seen and examined in conjunction with Dr. Oneida Alar  History and exam findings as above.  Patient with previous renal stent in 2005 subsequent restenting in 2007. Recently had elevation of her serum creatinine. This has improved to 1.6 but has not completely normalized. She had an MRI scan done recently which suggested subtotal or total occlusion of the left renal artery. Findings on our renal duplex scan recently were similar. The patient's left kidney is fairly atrophic with length of 8.5 cm. There was also cortical thinning. I discussed with the patient today the possibility of a CO2 angiogram to  further evaluate the stent see whether or not restenosis had occurred and whether or not this would be salvageable. I also discussed with her the possibility that we would potentially have to give her some contrast to perform the intervention but we would not know that until the time of the procedure. I also discussed with the patient possibility of just a diagnostic angiogram with carbon dioxide. At this point she wishes to defer any intervention due to not wanting to risk her right kidney to contrast if necessary. I also discussed with her today the possibility of a left renal bypass but would not consider left renal artery bypass to an 8.5 cm kidney. The patient wishes to consider her options at this point. She would like to be off of her steroids to see if her blood pressure improves with this. Her steroid course is supposed the last a few more days.  She will follow-up with me in 6 weeks  Ruta Hinds, MD [facility] [place] Office: [phone] Pager: [phone]

## 2015-04-10 ENCOUNTER — Encounter: Payer: Self-pay | Admitting: Nephrology

## 2015-04-17 ENCOUNTER — Telehealth: Payer: Self-pay | Admitting: Internal Medicine

## 2015-04-17 NOTE — Telephone Encounter (Signed)
Left message for return call.

## 2015-04-17 NOTE — Telephone Encounter (Signed)
This is up to Dr. Lorrene Reid. Please have pt call her.

## 2015-04-17 NOTE — Telephone Encounter (Signed)
Patient was put on new BP med in the hospital:  Hydrazlazine 25 mg tid.  She is not happy with this med.  She wants to know if she can go back to her old medication.  She believes that is the Maxzide and it worked much better for her.  She wants the ability to take something daily instead of tid.  It's a hassle for her.  She called Dr. Oneida Alar' office and they advised patient that she should contact you for management of her BP medication.  Please advise and thanks.  Pharmacy:  [facility] [place].

## 2015-04-20 NOTE — Telephone Encounter (Signed)
Patient notified

## 2015-04-29 DIAGNOSIS — N183 Chronic kidney disease, stage 3 (moderate): Secondary | ICD-10-CM | POA: Diagnosis not present

## 2015-05-04 DIAGNOSIS — I701 Atherosclerosis of renal artery: Secondary | ICD-10-CM | POA: Diagnosis not present

## 2015-05-04 DIAGNOSIS — N179 Acute kidney failure, unspecified: Secondary | ICD-10-CM | POA: Diagnosis not present

## 2015-05-04 DIAGNOSIS — N183 Chronic kidney disease, stage 3 (moderate): Secondary | ICD-10-CM | POA: Diagnosis not present

## 2015-05-04 DIAGNOSIS — I129 Hypertensive chronic kidney disease with stage 1 through stage 4 chronic kidney disease, or unspecified chronic kidney disease: Secondary | ICD-10-CM | POA: Diagnosis not present

## 2015-05-13 ENCOUNTER — Encounter: Payer: Self-pay | Admitting: Internal Medicine

## 2015-05-26 DIAGNOSIS — M5441 Lumbago with sciatica, right side: Secondary | ICD-10-CM | POA: Diagnosis not present

## 2015-05-26 DIAGNOSIS — G8929 Other chronic pain: Secondary | ICD-10-CM | POA: Diagnosis not present

## 2015-06-01 DIAGNOSIS — G8929 Other chronic pain: Secondary | ICD-10-CM | POA: Diagnosis not present

## 2015-06-01 DIAGNOSIS — M5441 Lumbago with sciatica, right side: Secondary | ICD-10-CM | POA: Diagnosis not present

## 2015-06-04 DIAGNOSIS — G8929 Other chronic pain: Secondary | ICD-10-CM | POA: Diagnosis not present

## 2015-06-04 DIAGNOSIS — M5441 Lumbago with sciatica, right side: Secondary | ICD-10-CM | POA: Diagnosis not present

## 2015-06-10 ENCOUNTER — Other Ambulatory Visit: Payer: Self-pay | Admitting: Internal Medicine

## 2015-06-10 DIAGNOSIS — N183 Chronic kidney disease, stage 3 (moderate): Secondary | ICD-10-CM | POA: Diagnosis not present

## 2015-06-10 DIAGNOSIS — I701 Atherosclerosis of renal artery: Secondary | ICD-10-CM | POA: Diagnosis not present

## 2015-06-10 DIAGNOSIS — Z862 Personal history of diseases of the blood and blood-forming organs and certain disorders involving the immune mechanism: Secondary | ICD-10-CM | POA: Diagnosis not present

## 2015-06-10 DIAGNOSIS — I129 Hypertensive chronic kidney disease with stage 1 through stage 4 chronic kidney disease, or unspecified chronic kidney disease: Secondary | ICD-10-CM | POA: Diagnosis not present

## 2015-06-10 DIAGNOSIS — R5383 Other fatigue: Secondary | ICD-10-CM | POA: Diagnosis not present

## 2015-06-11 ENCOUNTER — Telehealth: Payer: Self-pay | Admitting: Internal Medicine

## 2015-06-11 NOTE — Telephone Encounter (Signed)
Agree with Dr. Sanda Klein suggestion

## 2015-06-11 NOTE — Telephone Encounter (Signed)
Patient called back; states she's having labs drawn at Dr. Sanda Klein office on 5/1.  She's scheduled here for Lipid, Liver, BMET, A1C on 5/9 for f/u OV with Dr. Renold Genta on 5/11.  Faxed lab order to Dr. Sanda Klein office to draw our labs on 5/1 with other labs.  They'll fax results to [place] for her 5/11 OV.

## 2015-06-11 NOTE — Telephone Encounter (Signed)
Patient notified

## 2015-06-11 NOTE — Telephone Encounter (Signed)
She had some lab work done with Dr. Lorrene Reid yesterday.  Her creatnine is doing well (1.43).  Iron and Ferritin came back quite low.  Hasn't been on any BP meds since she was in the hospital.   She was started back on 5mg  Benazopril for BP.  They just called her with lab results.  They are going to do an iron infusion.  They are to call and set this up with her.  She wants to make sure that you feel this is a good idea for her.  Her complaints to Dr. Lorrene Reid were tired and lethargic all the time.  As a result, Dr. Lorrene Reid wants her to have this iron infusion, but she wants to make sure that you feel this is a good idea for her.  Otherwise, she just wanted to make you aware of her lab results and we should be expecting to receive a copy of these labs soon from Dr. Sanda Klein office.    Please advise.  Thank you.

## 2015-06-18 ENCOUNTER — Encounter (HOSPITAL_COMMUNITY)
Admission: RE | Admit: 2015-06-18 | Discharge: 2015-06-18 | Disposition: A | Discharge status: Home / Self Care | Payer: Medicare Other | Source: Ambulatory Visit | Attending: Nephrology | Admitting: Nephrology

## 2015-06-18 DIAGNOSIS — D509 Iron deficiency anemia, unspecified: Secondary | ICD-10-CM | POA: Diagnosis not present

## 2015-06-18 MED ORDER — SODIUM CHLORIDE 0.9 % IV SOLN
510.0000 mg | Freq: Once | INTRAVENOUS | Status: AC
  Administered 2015-06-18: 510 mg via INTRAVENOUS
  Filled 2015-06-18: qty 17

## 2015-06-18 NOTE — Discharge Instructions (Signed)

## 2015-06-19 ENCOUNTER — Encounter (HOSPITAL_COMMUNITY): Payer: Medicare Other

## 2015-06-22 DIAGNOSIS — N183 Chronic kidney disease, stage 3 (moderate): Secondary | ICD-10-CM | POA: Diagnosis not present

## 2015-06-22 DIAGNOSIS — E785 Hyperlipidemia, unspecified: Secondary | ICD-10-CM | POA: Diagnosis not present

## 2015-06-22 DIAGNOSIS — R7303 Prediabetes: Secondary | ICD-10-CM | POA: Diagnosis not present

## 2015-06-30 ENCOUNTER — Other Ambulatory Visit: Payer: Medicare Other | Admitting: Internal Medicine

## 2015-07-02 ENCOUNTER — Encounter: Payer: Self-pay | Admitting: Internal Medicine

## 2015-07-02 ENCOUNTER — Ambulatory Visit (INDEPENDENT_AMBULATORY_CARE_PROVIDER_SITE_OTHER): Payer: Medicare Other | Admitting: Internal Medicine

## 2015-07-02 VITALS — BP 100/68 | HR 76 | Temp 98.6°F | Resp 18 | Ht 61.0 in | Wt 148.5 lb

## 2015-07-02 DIAGNOSIS — E785 Hyperlipidemia, unspecified: Secondary | ICD-10-CM

## 2015-07-02 DIAGNOSIS — D509 Iron deficiency anemia, unspecified: Secondary | ICD-10-CM

## 2015-07-02 DIAGNOSIS — R55 Syncope and collapse: Secondary | ICD-10-CM | POA: Diagnosis not present

## 2015-07-02 DIAGNOSIS — N183 Chronic kidney disease, stage 3 unspecified: Secondary | ICD-10-CM

## 2015-07-02 DIAGNOSIS — I10 Essential (primary) hypertension: Secondary | ICD-10-CM

## 2015-07-02 DIAGNOSIS — Z87448 Personal history of other diseases of urinary system: Secondary | ICD-10-CM | POA: Diagnosis not present

## 2015-07-02 DIAGNOSIS — Z8679 Personal history of other diseases of the circulatory system: Secondary | ICD-10-CM

## 2015-07-02 DIAGNOSIS — R5383 Other fatigue: Secondary | ICD-10-CM

## 2015-07-02 NOTE — Patient Instructions (Signed)
Return end of May to have iron and iron-binding capacity along with CBC checked. Wear 24-hour Holter monitor for presyncope and palpitations.

## 2015-07-03 ENCOUNTER — Other Ambulatory Visit: Payer: Medicare Other | Admitting: Internal Medicine

## 2015-07-03 DIAGNOSIS — R55 Syncope and collapse: Secondary | ICD-10-CM

## 2015-07-03 DIAGNOSIS — R5383 Other fatigue: Secondary | ICD-10-CM | POA: Diagnosis not present

## 2015-07-03 LAB — TSH: TSH: 1.79 mIU/L

## 2015-07-03 LAB — T4, FREE: Free T4: 1.4 ng/dL (ref 0.8–1.8)

## 2015-07-15 ENCOUNTER — Other Ambulatory Visit: Payer: Self-pay

## 2015-07-15 ENCOUNTER — Other Ambulatory Visit: Payer: Self-pay | Admitting: Dermatology

## 2015-07-15 DIAGNOSIS — Z6828 Body mass index (BMI) 28.0-28.9, adult: Secondary | ICD-10-CM | POA: Diagnosis not present

## 2015-07-15 DIAGNOSIS — Z01419 Encounter for gynecological examination (general) (routine) without abnormal findings: Secondary | ICD-10-CM | POA: Diagnosis not present

## 2015-07-15 DIAGNOSIS — D485 Neoplasm of uncertain behavior of skin: Secondary | ICD-10-CM | POA: Diagnosis not present

## 2015-07-15 DIAGNOSIS — L570 Actinic keratosis: Secondary | ICD-10-CM | POA: Diagnosis not present

## 2015-07-15 MED ORDER — ROSUVASTATIN CALCIUM 5 MG PO TABS: 5.0000 mg | ORAL_TABLET | Freq: Every day | ORAL | Status: DC

## 2015-07-16 ENCOUNTER — Ambulatory Visit (INDEPENDENT_AMBULATORY_CARE_PROVIDER_SITE_OTHER): Payer: Medicare Other

## 2015-07-16 DIAGNOSIS — R55 Syncope and collapse: Secondary | ICD-10-CM | POA: Diagnosis not present

## 2015-07-16 DIAGNOSIS — R5383 Other fatigue: Secondary | ICD-10-CM

## 2015-07-21 ENCOUNTER — Other Ambulatory Visit: Payer: Medicare Other | Admitting: Internal Medicine

## 2015-07-21 DIAGNOSIS — D509 Iron deficiency anemia, unspecified: Secondary | ICD-10-CM

## 2015-07-21 DIAGNOSIS — H35341 Macular cyst, hole, or pseudohole, right eye: Secondary | ICD-10-CM | POA: Diagnosis not present

## 2015-07-21 LAB — CBC WITH DIFFERENTIAL/PLATELET
Basophils Absolute: 64 {cells}/uL (ref 0–200)
Basophils Relative: 1 %
Eosinophils Absolute: 320 {cells}/uL (ref 15–500)
Eosinophils Relative: 5 %
HCT: 40.4 % (ref 35.0–45.0)
Hemoglobin: 13.5 g/dL (ref 11.7–15.5)
Lymphocytes Relative: 23 %
Lymphs Abs: 1472 {cells}/uL (ref 850–3900)
MCH: 27.3 pg (ref 27.0–33.0)
MCHC: 33.4 g/dL (ref 32.0–36.0)
MCV: 81.8 fL (ref 80.0–100.0)
MPV: 9.3 fL (ref 7.5–12.5)
Monocytes Absolute: 512 {cells}/uL (ref 200–950)
Monocytes Relative: 8 %
Neutro Abs: 4032 {cells}/uL (ref 1500–7800)
Neutrophils Relative %: 63 %
Platelets: 274 K/uL (ref 140–400)
RBC: 4.94 MIL/uL (ref 3.80–5.10)
RDW: 18.4 % — ABNORMAL HIGH (ref 11.0–15.0)
WBC: 6.4 K/uL (ref 3.8–10.8)

## 2015-07-21 LAB — IRON AND TIBC
%SAT: 21 % (ref 11–50)
Iron: 67 ug/dL (ref 45–160)
TIBC: 318 ug/dL (ref 250–450)
UIBC: 251 ug/dL (ref 125–400)

## 2015-07-21 NOTE — Progress Notes (Signed)
   Subjective:    Patient ID: Charlotte Duarte, female    DOB: 1941/01/14, 75 y.o.   MRN: [id number]  HPI 75 year old Female in today for six-month recheck.History of chronic kidney disease stage III with history of renal insufficiency in 2005 with a workup that included left renal artery stenosis. She had initial PTA and stent in March 2005 and then had PTCA of an in-stent stenosis 05/04/2005. She was started on Plavix. She was in the hospital In early February with acute kidney failure. This started after she took Keflex for UTI January 20. Subsequently developed urticaria and a macular papular rash treated with steroids. Subsequently creatinine increased to 2.35. She had decreased urine output. Was checked again at urgent care and creatinine increased 4.29. Benazepril was stopped. She was hospitalized. Given IV fluids and steroids. Creatinine 8-4.29 and was down to 2.05 by the time of discharge. A renal artery duplex study was done. The left proximal renal artery could not be visualized. MRA could not detect a signal beyond the location of the stent. Dr. Oneida Alar, vascular surgeon saw her and recommended CO2 angiography to determine if the left renal artery stent was occluded. Patient subsequently declined this procedure.  Her acute kidney injury did resolve and creatinine in March was 1.51 which was baseline. Says it being in the hospital was frightening experience for her. Since then, she's felt fatigued for some unknown reason.She also had a near syncopal episode recently. She's felt tired and lethargic. She doesn't know what's wrong with her.  She's been labeled allergic to cephalosporins due to urticaria.  History of hypertension, hyperlipidemia, hypercalcemia, anemia, chronic kidney disease stage III        Review of Systems see above     Objective:   Physical Exam  Skin warm and dry. Nodes none. Neck is supple without JVD thyromegaly or carotid bruits. Chest clear. Cardiac exam regular  rate and rhythm normal S1 and S2. Extremities without edema. EKG performed showing no evidence of ischemia      Assessment & Plan:  History of renal artery stenosis  Fatigue-etiology unclear  Acute on chronic kidney disease-improved with hospitalization and now creatinine back to baseline at 1.51  Allergic reaction to Keflex  Hypertension  Hyperlipidemia  History of hypercalcemia  History of anemia  History of iron deficiency-received iron infusion April 27  Near syncope  Plan: Were going to check her iron level since she is fatigued. Because of near syncope, she will wear a Holter monitor. EKG obtained.

## 2015-07-22 DIAGNOSIS — Z1231 Encounter for screening mammogram for malignant neoplasm of breast: Secondary | ICD-10-CM | POA: Diagnosis not present

## 2015-07-22 DIAGNOSIS — Z803 Family history of malignant neoplasm of breast: Secondary | ICD-10-CM | POA: Diagnosis not present

## 2015-07-27 ENCOUNTER — Other Ambulatory Visit: Payer: Self-pay | Admitting: Internal Medicine

## 2015-08-29 ENCOUNTER — Other Ambulatory Visit: Payer: Self-pay | Admitting: Internal Medicine

## 2015-09-02 DIAGNOSIS — N183 Chronic kidney disease, stage 3 (moderate): Secondary | ICD-10-CM | POA: Diagnosis not present

## 2015-09-02 DIAGNOSIS — I129 Hypertensive chronic kidney disease with stage 1 through stage 4 chronic kidney disease, or unspecified chronic kidney disease: Secondary | ICD-10-CM | POA: Diagnosis not present

## 2015-09-02 DIAGNOSIS — I701 Atherosclerosis of renal artery: Secondary | ICD-10-CM | POA: Diagnosis not present

## 2015-09-28 DIAGNOSIS — S83241A Other tear of medial meniscus, current injury, right knee, initial encounter: Secondary | ICD-10-CM | POA: Diagnosis not present

## 2015-09-28 DIAGNOSIS — M170 Bilateral primary osteoarthritis of knee: Secondary | ICD-10-CM | POA: Diagnosis not present

## 2015-10-08 ENCOUNTER — Ambulatory Visit: Payer: Medicare Other | Admitting: Vascular Surgery

## 2015-10-18 ENCOUNTER — Other Ambulatory Visit: Payer: Self-pay | Admitting: Internal Medicine

## 2015-12-06 ENCOUNTER — Other Ambulatory Visit: Payer: Self-pay | Admitting: Internal Medicine

## 2016-01-19 ENCOUNTER — Ambulatory Visit (INDEPENDENT_AMBULATORY_CARE_PROVIDER_SITE_OTHER): Payer: Medicare Other | Admitting: Internal Medicine

## 2016-01-19 ENCOUNTER — Encounter: Payer: Self-pay | Admitting: Internal Medicine

## 2016-01-19 VITALS — BP 158/90 | HR 98 | Temp 97.8°F | Wt 150.0 lb

## 2016-01-19 DIAGNOSIS — Z8679 Personal history of other diseases of the circulatory system: Secondary | ICD-10-CM

## 2016-01-19 DIAGNOSIS — N183 Chronic kidney disease, stage 3 unspecified: Secondary | ICD-10-CM

## 2016-01-19 DIAGNOSIS — R350 Frequency of micturition: Secondary | ICD-10-CM

## 2016-01-19 DIAGNOSIS — R8290 Unspecified abnormal findings in urine: Secondary | ICD-10-CM | POA: Diagnosis not present

## 2016-01-19 LAB — POCT URINALYSIS DIPSTICK
Bilirubin, UA: NEGATIVE
Blood, UA: NEGATIVE
GLUCOSE UA: NEGATIVE
KETONES UA: NEGATIVE
Nitrite, UA: NEGATIVE
Protein, UA: NEGATIVE
SPEC GRAV UA: 1.025
Urobilinogen, UA: NEGATIVE
pH, UA: 6

## 2016-01-19 NOTE — Progress Notes (Signed)
   Subjective:    Patient ID: Charlotte Duarte, female    DOB: 1940-12-10, 75 y.o.   MRN: [id number]  HPI 75 year old Female with history of renal artery stenosis and stage III chronic kidney disease status post acute renal failure with hospitalization in February 2017.  Patient thinks she has a urinary tract infection. Has some frequency but no dysuria. Has noticed cloudy urine.  No fever or shaking chills nausea or vomiting. Continues to be followed by Dr. Lorrene Reid.  Had Klebsiella urinary tract infection January 2017. Had Enterococcus species on urine culture 35,000 colonies per milliliter February 2017.  Review of Systems     Objective:   Physical Exam  No CVA tenderness. Urine dipstick is abnormal. Culture sent.      Assessment & Plan:  Abnormal urine dipstick  Probable urinary tract infection  History of urinary tract infections. Renal artery stenosis  Chronic kidney disease  Plan: Await culture results.

## 2016-01-20 LAB — URINALYSIS, MICROSCOPIC ONLY

## 2016-01-20 NOTE — Patient Instructions (Signed)
Await urine culture results before considering treatment.

## 2016-01-21 ENCOUNTER — Telehealth: Payer: Self-pay | Admitting: Internal Medicine

## 2016-01-21 ENCOUNTER — Other Ambulatory Visit: Payer: Self-pay | Admitting: Internal Medicine

## 2016-01-21 LAB — URINE CULTURE

## 2016-01-21 MED ORDER — AMOXICILLIN 250 MG PO CAPS: 250.0000 mg | ORAL_CAPSULE | Freq: Two times a day (BID) | ORAL | 0 refills | Status: DC

## 2016-01-21 NOTE — Telephone Encounter (Signed)
-----   Message from Elby Showers, MD sent at 01/21/2016  1:09 PM EST ----- Patient cannot tolerate Augmentin because of nausea and vomiting. Patient was on Keflex and developed acute renal failure previously. We're going to try amoxicillin 250 mg 2  times daily for 7 days. She will have follow-up urine specimen at that time. Spoken with her by phone. 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 She is a bit concerned about cross-reactivity with Keflex. Nitrofurantoin toying does not have good sensitivity. She says her recent creatinine was approximately 1.5.  Call in amoxicillin 250 mg twice daily for 7 days #14 tablets please

## 2016-01-21 NOTE — Telephone Encounter (Signed)
Amoxicillin sent to pharmacy

## 2016-02-16 ENCOUNTER — Ambulatory Visit (INDEPENDENT_AMBULATORY_CARE_PROVIDER_SITE_OTHER): Payer: Medicare Other | Admitting: Internal Medicine

## 2016-02-16 ENCOUNTER — Encounter: Payer: Self-pay | Admitting: Internal Medicine

## 2016-02-16 ENCOUNTER — Telehealth: Payer: Self-pay | Admitting: Internal Medicine

## 2016-02-16 VITALS — BP 118/80 | HR 80 | Wt 146.0 lb

## 2016-02-16 DIAGNOSIS — N183 Chronic kidney disease, stage 3 unspecified: Secondary | ICD-10-CM

## 2016-02-16 DIAGNOSIS — E869 Volume depletion, unspecified: Secondary | ICD-10-CM | POA: Diagnosis not present

## 2016-02-16 DIAGNOSIS — R197 Diarrhea, unspecified: Secondary | ICD-10-CM

## 2016-02-16 DIAGNOSIS — Z8679 Personal history of other diseases of the circulatory system: Secondary | ICD-10-CM

## 2016-02-16 LAB — CBC WITH DIFFERENTIAL/PLATELET
BASOS ABS: 0 {cells}/uL (ref 0–200)
BASOS PCT: 0 %
EOS PCT: 3 %
Eosinophils Absolute: 273 cells/uL (ref 15–500)
HCT: 44.2 % (ref 35.0–45.0)
HEMOGLOBIN: 15.2 g/dL (ref 11.7–15.5)
LYMPHS ABS: 1092 {cells}/uL (ref 850–3900)
Lymphocytes Relative: 12 %
MCH: 29.5 pg (ref 27.0–33.0)
MCHC: 34.4 g/dL (ref 32.0–36.0)
MCV: 85.8 fL (ref 80.0–100.0)
MPV: 9.3 fL (ref 7.5–12.5)
Monocytes Absolute: 637 cells/uL (ref 200–950)
Monocytes Relative: 7 %
NEUTROS ABS: 7098 {cells}/uL (ref 1500–7800)
Neutrophils Relative %: 78 %
PLATELETS: 258 10*3/uL (ref 140–400)
RBC: 5.15 MIL/uL — AB (ref 3.80–5.10)
RDW: 14.1 % (ref 11.0–15.0)
WBC: 9.1 10*3/uL (ref 3.8–10.8)

## 2016-02-16 LAB — BASIC METABOLIC PANEL
BUN: 20 mg/dL (ref 7–25)
CALCIUM: 9.1 mg/dL (ref 8.6–10.4)
CO2: 25 mmol/L (ref 20–31)
Chloride: 99 mmol/L (ref 98–110)
Creat: 1.58 mg/dL — ABNORMAL HIGH (ref 0.60–0.93)
Glucose, Bld: 100 mg/dL — ABNORMAL HIGH (ref 65–99)
Potassium: 3.2 mmol/L — ABNORMAL LOW (ref 3.5–5.3)
SODIUM: 137 mmol/L (ref 135–146)

## 2016-02-16 LAB — HEMOCCULT GUIAC POC 1CARD (OFFICE): FECAL OCCULT BLD: NEGATIVE

## 2016-02-16 NOTE — Progress Notes (Signed)
   Subjective:    Patient ID: Charlotte Duarte, female    DOB: 02/26/1940, 75 y.o.   MRN: [id number]  HPI Onset December 24 while taking a hike with sudden urge to defecate. Had 2 loose stools. She has been to a lot of holiday parties over the last couple of weeks. Has continued to have diarrhea several episodes a day. Feels weak and washed out. She has a history of renal artery stenosis. Was hospitalized in February with acute renal failure with azotemia. Says she's been trying to keep her self hydrated. Has no nausea and vomiting.  No blood in bowel movement. No melena. She did see a slight amount of blood when wiping but felt irritated about her rectum. Has not been on antibiotics recently.    Review of Systems See above    Objective:   Physical Exam Small anal fissure. Stool is guaiac negative. Abdomen is soft nondistended without hepatosplenomegaly masses or tenderness       Assessment & Plan:  Probable viral gastroenteritis-? Norovirus  Plan: She is to stay well hydrated with plenty of clear liquids. May have soup crackers and dry toast. CBC with differential and basic metabolic panel drawn today.

## 2016-02-16 NOTE — Telephone Encounter (Signed)
Scheduled at 2:15pm.

## 2016-02-16 NOTE — Patient Instructions (Signed)
Clear liquids. Stay well hydrated. Lab work is pending. Call as symptoms worsen.

## 2016-02-16 NOTE — Telephone Encounter (Signed)
Patient states she has been having diarrhea x 2 days with some bleeding now.  She's having bowel cramping.  States that it is very little blood this morning and a little mucous with a little streak of blood this morning.  States she has taken Immodium over the last 2 days which doesn't seem to have helped.   (3 - 1 on Sunday, 1 on Monday and 1 this morning).  States that she doesn't have a GI doctor.    Advised patient to change to a clear liquid diet while I got a message to you.  Also instructed patient to not do any dairy or solid foods.  Patient instructed to await further instructions.  Patient states she hasn't had any abdominal pain to speak of, feels the bleeding could possibly be from a hemorrhoid.    Best number for contact:  [phone]

## 2016-02-16 NOTE — Telephone Encounter (Signed)
See today

## 2016-02-19 ENCOUNTER — Telehealth: Payer: Self-pay | Admitting: Internal Medicine

## 2016-02-19 NOTE — Telephone Encounter (Signed)
Patient called because she had the Norovirus which is causing her to vomit thus leading to really bad stomach cramps and she is unable to tolerate food. Patient wonders if there's anything that can be given to help her. Please advise.

## 2016-02-19 NOTE — Telephone Encounter (Signed)
Advised patient to try BRAT diet and get plenty of fluids.  Nothing else at this time.

## 2016-02-19 NOTE — Telephone Encounter (Signed)
It will take some time for the nausea to resolve and may not have appetitie for 2 weeks.

## 2016-02-23 ENCOUNTER — Other Ambulatory Visit: Payer: Medicare Other | Admitting: Internal Medicine

## 2016-02-26 DIAGNOSIS — I129 Hypertensive chronic kidney disease with stage 1 through stage 4 chronic kidney disease, or unspecified chronic kidney disease: Secondary | ICD-10-CM | POA: Diagnosis not present

## 2016-02-26 DIAGNOSIS — I701 Atherosclerosis of renal artery: Secondary | ICD-10-CM | POA: Diagnosis not present

## 2016-02-26 DIAGNOSIS — N183 Chronic kidney disease, stage 3 (moderate): Secondary | ICD-10-CM | POA: Diagnosis not present

## 2016-02-29 DIAGNOSIS — E876 Hypokalemia: Secondary | ICD-10-CM | POA: Diagnosis not present

## 2016-03-07 ENCOUNTER — Other Ambulatory Visit: Payer: Self-pay

## 2016-03-07 MED ORDER — ROSUVASTATIN CALCIUM 5 MG PO TABS: 5.0000 mg | ORAL_TABLET | Freq: Every day | ORAL | 3 refills | Status: DC

## 2016-03-07 NOTE — Telephone Encounter (Signed)
Pt states that she has norovirus started on Dec 24, pt still having several loose bowel movements throughout the day very dark stools with mucus along with abd cramping and bloating. Hasn't noticed any blood in her stools. She's taking "one tab of imodium every day or every other day".

## 2016-03-11 ENCOUNTER — Ambulatory Visit: Payer: Self-pay | Admitting: Internal Medicine

## 2016-03-14 DIAGNOSIS — L570 Actinic keratosis: Secondary | ICD-10-CM | POA: Diagnosis not present

## 2016-03-14 DIAGNOSIS — D229 Melanocytic nevi, unspecified: Secondary | ICD-10-CM | POA: Diagnosis not present

## 2016-03-16 ENCOUNTER — Other Ambulatory Visit: Payer: Self-pay | Admitting: Internal Medicine

## 2016-03-17 NOTE — Telephone Encounter (Signed)
Called rosuvastatin to [facility] not to dispense CRESTOR, filled for a year per Dr. Renold Genta.

## 2016-04-12 DIAGNOSIS — M9905 Segmental and somatic dysfunction of pelvic region: Secondary | ICD-10-CM | POA: Diagnosis not present

## 2016-04-12 DIAGNOSIS — M9902 Segmental and somatic dysfunction of thoracic region: Secondary | ICD-10-CM | POA: Diagnosis not present

## 2016-04-12 DIAGNOSIS — M9903 Segmental and somatic dysfunction of lumbar region: Secondary | ICD-10-CM | POA: Diagnosis not present

## 2016-04-12 DIAGNOSIS — M9906 Segmental and somatic dysfunction of lower extremity: Secondary | ICD-10-CM | POA: Diagnosis not present

## 2016-04-14 DIAGNOSIS — M9902 Segmental and somatic dysfunction of thoracic region: Secondary | ICD-10-CM | POA: Diagnosis not present

## 2016-04-14 DIAGNOSIS — M9903 Segmental and somatic dysfunction of lumbar region: Secondary | ICD-10-CM | POA: Diagnosis not present

## 2016-04-14 DIAGNOSIS — M9905 Segmental and somatic dysfunction of pelvic region: Secondary | ICD-10-CM | POA: Diagnosis not present

## 2016-04-14 DIAGNOSIS — M9906 Segmental and somatic dysfunction of lower extremity: Secondary | ICD-10-CM | POA: Diagnosis not present

## 2016-04-27 DIAGNOSIS — L570 Actinic keratosis: Secondary | ICD-10-CM | POA: Diagnosis not present

## 2016-06-22 DIAGNOSIS — L570 Actinic keratosis: Secondary | ICD-10-CM | POA: Diagnosis not present

## 2016-07-21 ENCOUNTER — Ambulatory Visit (INDEPENDENT_AMBULATORY_CARE_PROVIDER_SITE_OTHER): Payer: Medicare Other | Admitting: Internal Medicine

## 2016-07-21 ENCOUNTER — Encounter: Payer: Self-pay | Admitting: Internal Medicine

## 2016-07-21 ENCOUNTER — Telehealth: Payer: Self-pay | Admitting: Internal Medicine

## 2016-07-21 ENCOUNTER — Ambulatory Visit
Admission: RE | Admit: 2016-07-21 | Discharge: 2016-07-21 | Disposition: A | Discharge status: Home / Self Care | Payer: Medicare Other | Source: Ambulatory Visit | Attending: Internal Medicine | Admitting: Internal Medicine

## 2016-07-21 VITALS — BP 122/80 | HR 62 | Temp 97.4°F | Wt 145.0 lb

## 2016-07-21 DIAGNOSIS — L03032 Cellulitis of left toe: Secondary | ICD-10-CM

## 2016-07-21 DIAGNOSIS — R52 Pain, unspecified: Secondary | ICD-10-CM | POA: Diagnosis not present

## 2016-07-21 DIAGNOSIS — M79675 Pain in left toe(s): Secondary | ICD-10-CM

## 2016-07-21 DIAGNOSIS — M7989 Other specified soft tissue disorders: Secondary | ICD-10-CM | POA: Diagnosis not present

## 2016-07-21 LAB — CBC WITH DIFFERENTIAL/PLATELET
Basophils Absolute: 63 cells/uL (ref 0–200)
Basophils Relative: 1 %
EOS PCT: 5 %
Eosinophils Absolute: 315 cells/uL (ref 15–500)
HCT: 44 % (ref 35.0–45.0)
HEMOGLOBIN: 14.7 g/dL (ref 11.7–15.5)
LYMPHS ABS: 1512 {cells}/uL (ref 850–3900)
Lymphocytes Relative: 24 %
MCH: 28.3 pg (ref 27.0–33.0)
MCHC: 33.4 g/dL (ref 32.0–36.0)
MCV: 84.6 fL (ref 80.0–100.0)
MONOS PCT: 7 %
MPV: 9.5 fL (ref 7.5–12.5)
Monocytes Absolute: 441 cells/uL (ref 200–950)
NEUTROS ABS: 3969 {cells}/uL (ref 1500–7800)
Neutrophils Relative %: 63 %
PLATELETS: 286 10*3/uL (ref 140–400)
RBC: 5.2 MIL/uL — AB (ref 3.80–5.10)
RDW: 14.9 % (ref 11.0–15.0)
WBC: 6.3 10*3/uL (ref 3.8–10.8)

## 2016-07-21 MED ORDER — LEVOFLOXACIN 250 MG PO TABS: 250.0000 mg | ORAL_TABLET | Freq: Every day | ORAL | 0 refills | Status: DC

## 2016-07-21 NOTE — Telephone Encounter (Signed)
Called patient and provided appointment for today at 10:45 a.m.  Patient confirmed.

## 2016-07-21 NOTE — Patient Instructions (Addendum)
Levaquin 250 mg daily x 5 days. Soak foot in warm soapy water 20 minutes once or twice daily. Call if not better in 24-48 hours or sooner if worse.

## 2016-07-21 NOTE — Telephone Encounter (Signed)
Yes

## 2016-07-21 NOTE — Telephone Encounter (Signed)
Patient is complaining of joint pain in the 2nd toe on her right foot.  No known injury.  She said she doesn't know what gout feels like, but maybe this could be what it is after having heard people talk about it through the years?  She said it is red, it is hot and it hurts to touch it.  She feels she needs to be seen today.    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    Is today ok to see her?

## 2016-07-21 NOTE — Progress Notes (Signed)
   Subjective:    Patient ID: Charlotte Duarte, female    DOB: [birth year], 76 y.o.   MRN: [id number]  HPI  76 year old Female with pain and redness right second toe. It onset of symptoms nearly a week ago. Has become concerned she might have gout. Has been taking ibuprofen with some relief. Denies any injury to the toe. Says she has had more than one occurrence of this issue.  She recently lost her brother to ALS. He passed way yesterday.   Keflex causes hives and itching. Augmentin causes nausea and vomiting.  Review of Systems see above     Objective:   Physical Exam She has erythema right second toe with some some mild distal swelling. Painful PIP joint. No evidence of portal entry for cellulitis. Toe is not exquisitely tender to touch or hot.       Assessment & Plan:   Toe cellulitis  Chronic kidney disease followed by nephrology  Plan: CBC with diff.  Uric acid level. X-ray of toe.Levaquin 250 mg daily for 5 days. Using reduced dose due to history of chronic kidney disease  Addendum: X-ray of toe is negative. Uric acid and CBC with differential pending.

## 2016-07-22 LAB — URIC ACID: Uric Acid, Serum: 9.3 mg/dL — ABNORMAL HIGH (ref 2.5–7.0)

## 2016-07-22 MED ORDER — ALLOPURINOL 100 MG PO TABS: 100.0000 mg | ORAL_TABLET | Freq: Every day | ORAL | 1 refills | Status: DC

## 2016-07-22 NOTE — Addendum Note (Signed)
Addended by: Drucilla Schmidt on: 07/22/2016 10:38 AM   Modules accepted: Orders

## 2016-08-02 ENCOUNTER — Encounter: Payer: Self-pay | Admitting: Internal Medicine

## 2016-08-02 DIAGNOSIS — Z780 Asymptomatic menopausal state: Secondary | ICD-10-CM | POA: Diagnosis not present

## 2016-08-02 DIAGNOSIS — Z803 Family history of malignant neoplasm of breast: Secondary | ICD-10-CM | POA: Diagnosis not present

## 2016-08-02 DIAGNOSIS — Z1231 Encounter for screening mammogram for malignant neoplasm of breast: Secondary | ICD-10-CM | POA: Diagnosis not present

## 2016-08-03 DIAGNOSIS — Z01419 Encounter for gynecological examination (general) (routine) without abnormal findings: Secondary | ICD-10-CM | POA: Diagnosis not present

## 2016-08-11 ENCOUNTER — Other Ambulatory Visit: Payer: Self-pay | Admitting: Internal Medicine

## 2016-08-11 NOTE — Telephone Encounter (Signed)
Verbal OK from Dr. Renold Genta to fill this. Sent in 150mg , pt will have to take 4 instead of the 2 recommended.

## 2016-08-11 NOTE — Telephone Encounter (Signed)
Patient is calling requesting that we call in Clindamycin HCL for her.  She has Renal Stent and her dentist has her take this on a routine basis when she comes in.  She said her Nephrologist says that it should be filled by her PCP.  She is having her annual cleaning and Dr. Mariea Clonts (her dentist) asks that she take 2 - 300mg  tablets 1 hour prior to her appointment.    She says that the 300mg  tablets are WAY TOO BIG.  She wants to know if we will call in 150mg  tablets to equal the 300mg .  She says she just can't swallow the 300mg  tablets anymore.    [facility] [place]    Her number for contact is:  Home [phone]  Thank you.

## 2016-08-11 NOTE — Telephone Encounter (Signed)
LVM for pt that medication was sent to pharmacy

## 2016-08-12 MED ORDER — CLINDAMYCIN HCL 150 MG PO CAPS: ORAL_CAPSULE | ORAL | 0 refills | Status: DC

## 2016-08-23 IMAGING — MR MR MRA ABDOMEN W/ OR W/O CM
4 of 7 series · 19 of 48 positions shown · IV contrast (agent unspecified)
Comparison: Renal arteriogram 05/22/2003.  Abdominal MRA 05/05/2003

CLINICAL DATA: 74-year-old with history of renal artery stenosis
and left renal artery stenting. Chronic kidney disease, stage III.
Worsening renal function.

EXAM:
MRA ABDOMEN WITHOUT CONTRAST
TECHNIQUE: Angiographic images of the chest were obtained using MRA technique
without intravenous contrast.
CONTRAST:  None

[Series 3: T2 · axial · 6.0mm · 0.78mm/px · z∈[-125,+112]mm · 4 of 37 slices shown]
[im 1/37]
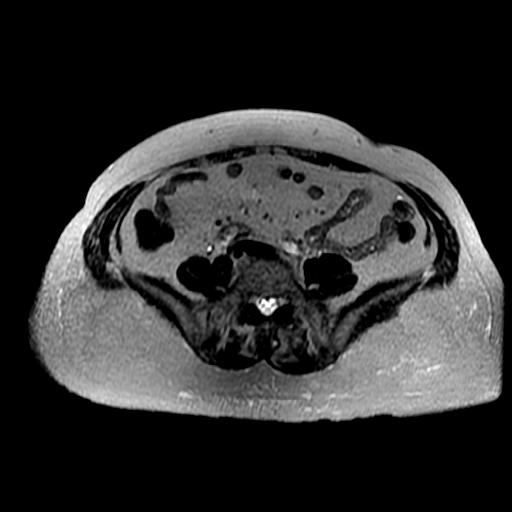
[im 13/37]
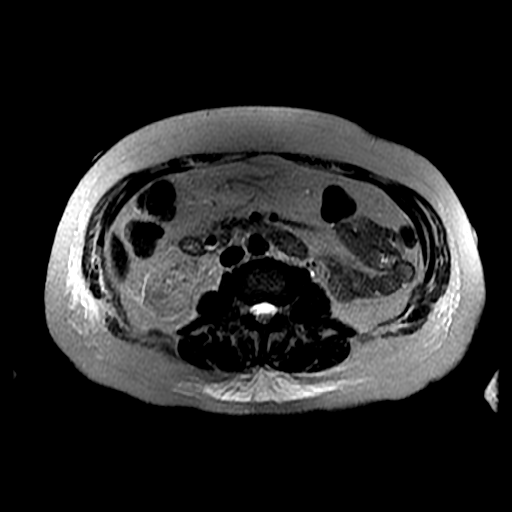
[im 25/37]
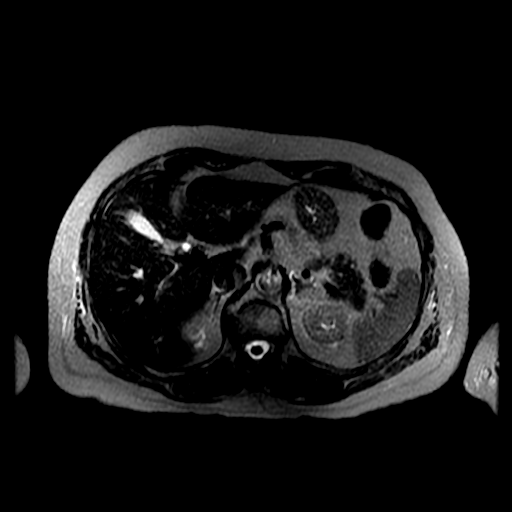
[im 37/37]
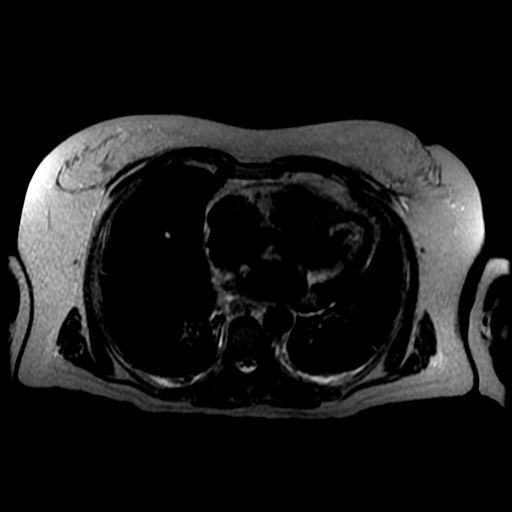

[Series 4: (id) · axial · 2.0mm · 0.70mm/px · z∈[-73,+66]mm · 9 of 156 slices shown (1 of 2)]
[im 1/156]
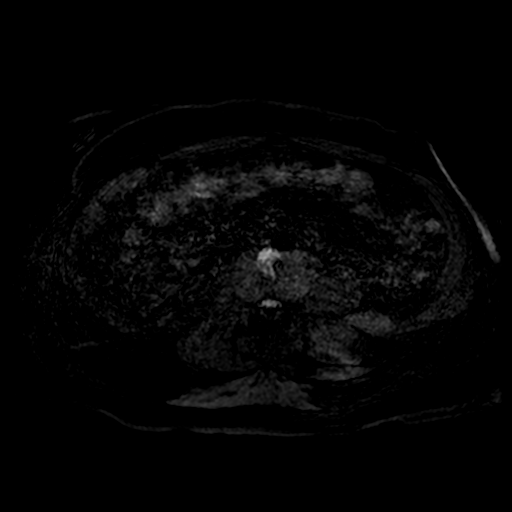
[im 16/156]
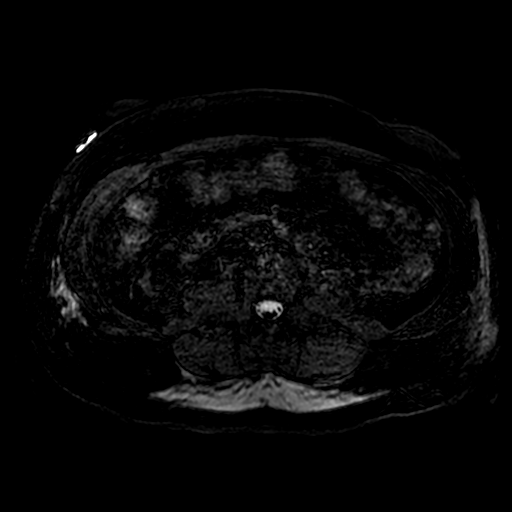
[im 32/156]
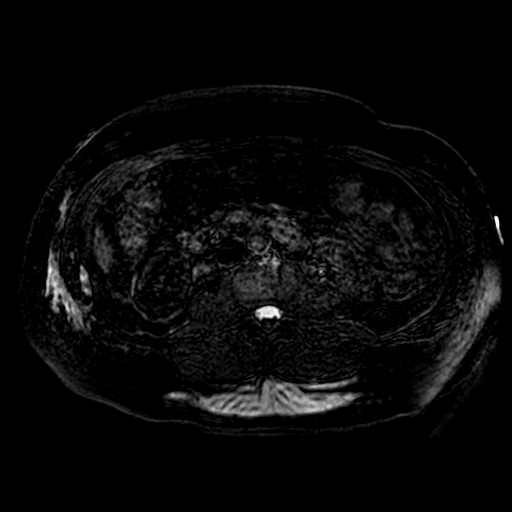
[im 47/156]
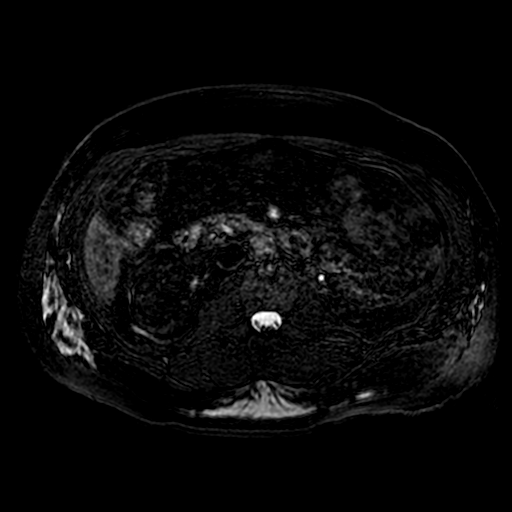
[im 63/156]
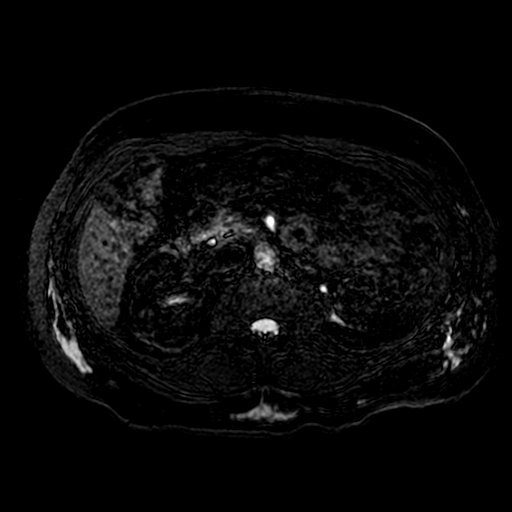
[im 78/156]
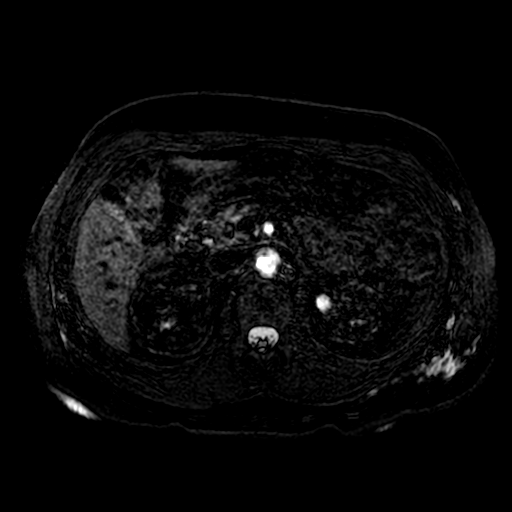
[im 94/156]
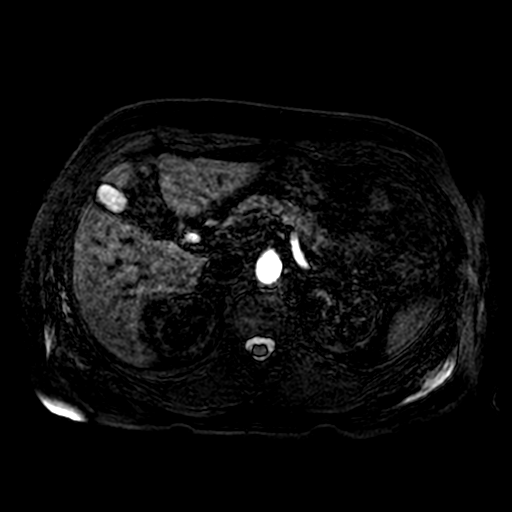
[im 109/156]
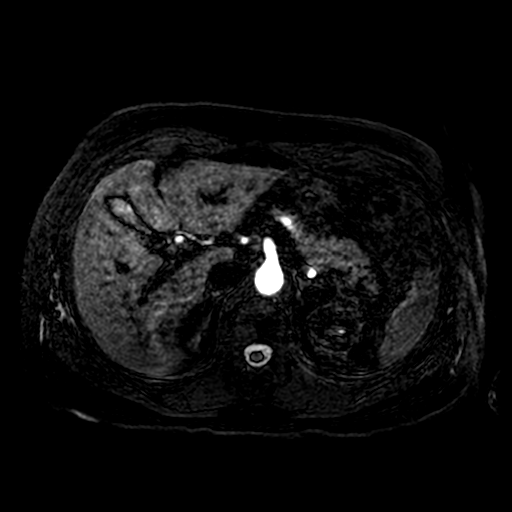
[im 140/156]
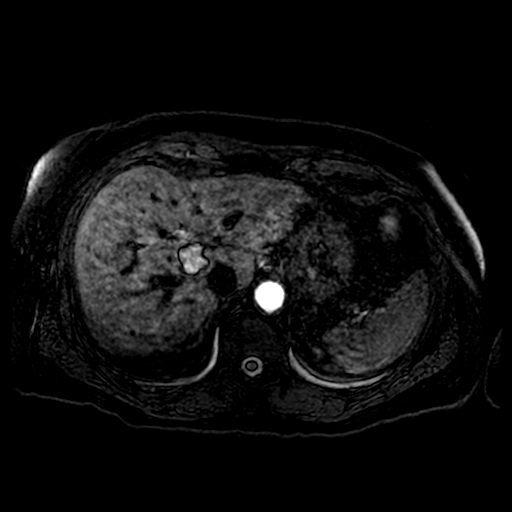

[Series 5: (id) · axial · 2.0mm · 0.70mm/px · z∈[-46,+28]mm · 3 of 104 slices shown (2 of 2)]
[im 15/104]
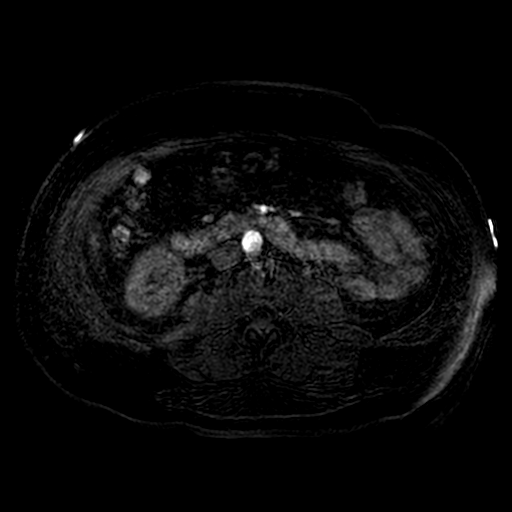
[im 59/104]
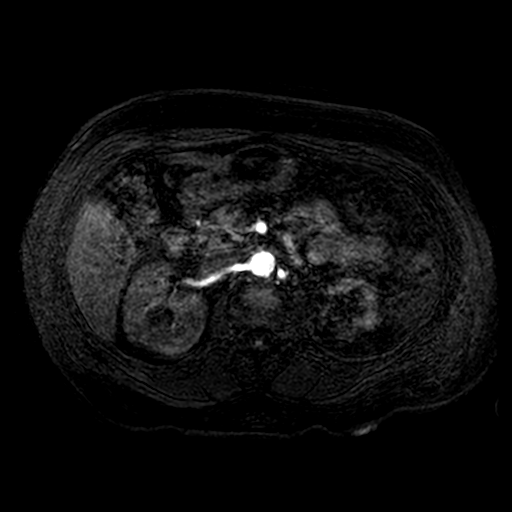
[im 89/104]
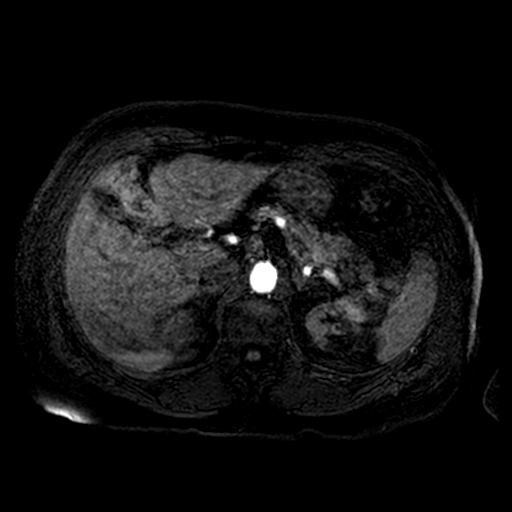

[Series 400: pjn · axial · 15.0mm · 0.51mm/px · z∈[-51,+45]mm · 3 of 130 slices shown]
[im 17/130]
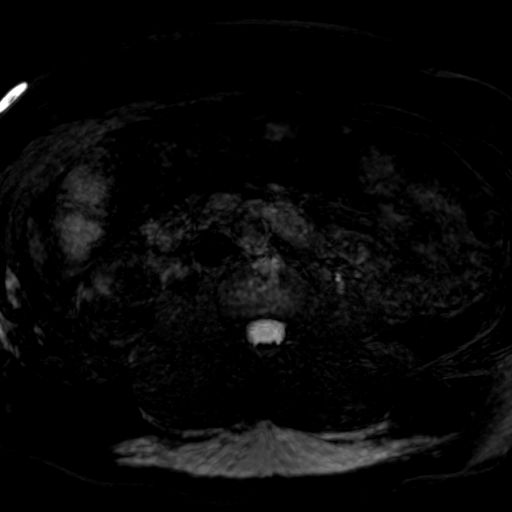
[im 65/130]
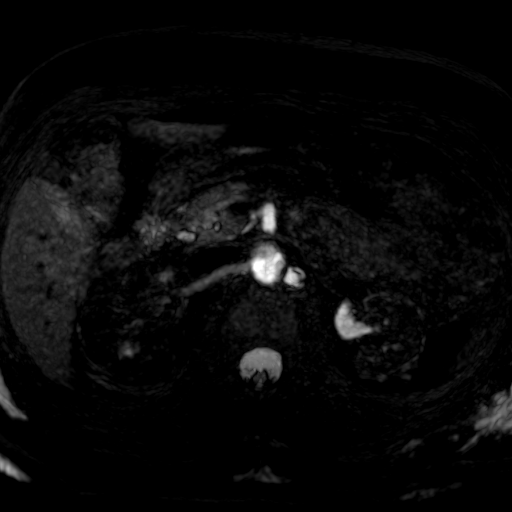
[im 113/130]
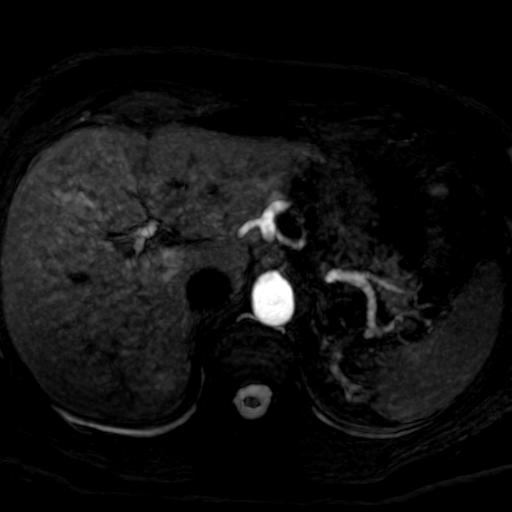

[19 of 48 positions shown; findings below may reference images not displayed]

FINDINGS: The celiac trunk and proximal SMA are patent. There is artifact from
the left renal artery stent and left renal artery cannot be
accurately evaluated on this non contrast examination. There is no
significant signal in the left renal artery distal to the stent
which raises concern for hemodynamically significant stenosis or
even an occlusion.

Left kidney is smaller than the right kidney. No gross abnormality
to the adrenal glands, spleen or pancreas. No gross abnormality to
the gallbladder. There is a lobulated T2 bright structure in the
central aspect of the liver. This likely represents a hepatic cyst
measuring up to 2.8 cm. There is punctate cyst at the hepatic dome.
No large pleural effusions.
IMPRESSION: Right renal artery is patent without evidence for stenosis.

Limited evaluation of the left renal artery due to artifact from the
implanted stent. No significant signal identified in the left renal
artery distal to the stent which raises concern for a high-grade
stenosis and cannot exclude an occlusion. In addition, there is
cortical atrophy in the left kidney that could be associated with
chronic renal artery stenosis. Left renal artery could be further
characterized with a renal duplex examination or catheter angiogram
with carbon dioxide.

Probable hepatic cysts.

## 2016-08-31 ENCOUNTER — Telehealth: Payer: Self-pay | Admitting: Internal Medicine

## 2016-08-31 NOTE — Telephone Encounter (Signed)
Patient has been taking an herbal supplement for her uric acid for about 6 weeks now.  She is wanting to come in to get her uric acid level re-drawn.  She wants to know if that is ok with you?  She is wanting to NOT have to take the medication for uric acid if at all possible.  Are you ok with her having the lab drawn?    Best number for contact for patient:  [phone]

## 2016-08-31 NOTE — Telephone Encounter (Signed)
OK to have uric acid level only drawn (dx Hyperuricemia)

## 2016-08-31 NOTE — Telephone Encounter (Signed)
Spoke with patient and advised that she can come in for lab draw.  Made appointment for Monday, 7/16.  Patient confirmed 9-12:00.

## 2016-09-03 DIAGNOSIS — G8929 Other chronic pain: Secondary | ICD-10-CM | POA: Diagnosis not present

## 2016-09-03 DIAGNOSIS — M5441 Lumbago with sciatica, right side: Secondary | ICD-10-CM | POA: Diagnosis not present

## 2016-09-04 ENCOUNTER — Other Ambulatory Visit: Payer: Self-pay | Admitting: Internal Medicine

## 2016-09-05 ENCOUNTER — Other Ambulatory Visit: Payer: Medicare Other | Admitting: Internal Medicine

## 2016-09-05 ENCOUNTER — Other Ambulatory Visit: Payer: Self-pay | Admitting: Internal Medicine

## 2016-09-26 ENCOUNTER — Ambulatory Visit (INDEPENDENT_AMBULATORY_CARE_PROVIDER_SITE_OTHER): Payer: Medicare Other

## 2016-09-26 ENCOUNTER — Ambulatory Visit (INDEPENDENT_AMBULATORY_CARE_PROVIDER_SITE_OTHER): Payer: Medicare Other | Admitting: Physical Medicine and Rehabilitation

## 2016-09-26 ENCOUNTER — Encounter (INDEPENDENT_AMBULATORY_CARE_PROVIDER_SITE_OTHER): Payer: Self-pay | Admitting: Physical Medicine and Rehabilitation

## 2016-09-26 VITALS — BP 151/90 | HR 63

## 2016-09-26 DIAGNOSIS — M5416 Radiculopathy, lumbar region: Secondary | ICD-10-CM | POA: Diagnosis not present

## 2016-09-26 MED ORDER — BETAMETHASONE SOD PHOS & ACET 6 (3-3) MG/ML IJ SUSP
12.0000 mg | Freq: Once | INTRAMUSCULAR | Status: AC
  Administered 2016-09-26: 12 mg

## 2016-09-26 MED ORDER — LIDOCAINE HCL (PF) 1 % IJ SOLN
2.0000 mL | Freq: Once | INTRAMUSCULAR | Status: AC
  Administered 2016-09-26: 2 mL

## 2016-09-26 NOTE — Patient Instructions (Signed)

## 2016-09-26 NOTE — Progress Notes (Deleted)
Right side low back pain. Radiates down leg to calf and to calf. Difficulty getting out of bed. Hard get moving in the morning. Worse with sitting and laying. Ok with walking and standing. Tingling in toes.

## 2016-09-26 NOTE — Progress Notes (Unsigned)
Fluoro Time: 32 sec Mgy: 19.34

## 2016-09-27 NOTE — Procedures (Signed)
Charlotte Duarte is very pleasant 76 year old female followed by Dr. Nelva Bush for her low back and somewhat radicular type right sided predominant low back pain. She saw Dr. Nelva Bush recently for evaluation. 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 Dr. Nelva Bush felt like she would benefit diagnostically and therapeutically from a right L4 transforaminal injection. Those notes were sent to me and are faxed in our system. She reports prior injections by Dr. Nelva Bush several years ago without much help. We discussed the findings of her MRI and the details of the injection and she does want to proceed. She'll follow-up with Dr. Nelva Bush. On exam she has good distal strength she ambulates without aid she has pain with extension of the lumbar spine. Depending on the results of the injection consideration should be given to facet joint intra-articular block and aspiration. Lumbosacral Transforaminal Epidural Steroid Injection - Sub-Pedicular Approach with Fluoroscopic Guidance  Patient: Charlotte Duarte      Date of Birth: 1940/06/22 MRN: [id number] PCP: Elby Showers, MD      Visit Date: 09/26/2016   Universal Protocol:    Date/Time: 09/26/2016  Consent Given By: the patient  Position: PRONE  Additional Comments: Vital signs were monitored before and after the procedure. Patient was prepped and draped in the usual sterile fashion. The correct patient, procedure, and site was verified.   Injection Procedure Details:  Procedure Site One Meds Administered:  Meds ordered this encounter  Medications  . lidocaine (PF) (XYLOCAINE) 1 % injection 2 mL  . betamethasone acetate-betamethasone sodium phosphate (CELESTONE) injection 12 mg    Laterality: Right  Location/Site:  L4-L5  Needle size: 22 G  Needle type: Spinal  Needle Placement: Transforaminal  Findings:  -Contrast Used: 0.5 mL iohexol 180 mg iodine/mL   -Comments:  Excellent flow of contrast along the nerve and into the epidural space.  Procedure Details: After squaring off the end-plates to get a true AP view, the C-arm was positioned so that an oblique view of the foramen as noted above was visualized. The target area is just inferior to the "nose of the scotty dog" or sub pedicular. The soft tissues overlying this structure were infiltrated with 2-3 ml. of 1% Lidocaine without Epinephrine.  The spinal needle was inserted toward the target using a "trajectory" view along the fluoroscope beam.  Under AP and lateral visualization, the needle was advanced so it did not puncture dura and was located close the 6 O'Clock position of the pedical in AP tracterory. Biplanar projections were used to confirm position. Aspiration was confirmed to be negative for CSF and/or blood. A 1-2 ml. volume of Isovue-250 was injected and flow of contrast was noted at each level. Radiographs were obtained for documentation purposes.   After attaining the desired flow of contrast documented above, a 0.5 to 1.0 ml test dose of 0.25% Marcaine was injected into each respective transforaminal space.  The patient was observed for 90 seconds post injection.  After no sensory deficits were reported, and normal lower extremity motor function was noted,   the above injectate was administered so that equal amounts of the injectate were placed at each foramen (level) into the transforaminal epidural space.   Additional Comments:  The patient tolerated the procedure well No complications occurred Dressing: Band-Aid    Post-procedure details: Patient was observed during the procedure. Post-procedure instructions were reviewed.  Patient left the clinic in stable condition.

## 2016-10-18 DIAGNOSIS — M5441 Lumbago with sciatica, right side: Secondary | ICD-10-CM | POA: Diagnosis not present

## 2016-10-18 DIAGNOSIS — M5136 Other intervertebral disc degeneration, lumbar region: Secondary | ICD-10-CM | POA: Diagnosis not present

## 2016-10-18 DIAGNOSIS — G8929 Other chronic pain: Secondary | ICD-10-CM | POA: Diagnosis not present

## 2016-10-19 DIAGNOSIS — M109 Gout, unspecified: Secondary | ICD-10-CM | POA: Diagnosis not present

## 2016-10-19 DIAGNOSIS — N183 Chronic kidney disease, stage 3 (moderate): Secondary | ICD-10-CM | POA: Diagnosis not present

## 2016-10-19 DIAGNOSIS — I701 Atherosclerosis of renal artery: Secondary | ICD-10-CM | POA: Diagnosis not present

## 2016-10-19 DIAGNOSIS — I129 Hypertensive chronic kidney disease with stage 1 through stage 4 chronic kidney disease, or unspecified chronic kidney disease: Secondary | ICD-10-CM | POA: Diagnosis not present

## 2016-10-26 DIAGNOSIS — M5136 Other intervertebral disc degeneration, lumbar region: Secondary | ICD-10-CM | POA: Diagnosis not present

## 2016-10-26 DIAGNOSIS — H903 Sensorineural hearing loss, bilateral: Secondary | ICD-10-CM | POA: Diagnosis not present

## 2016-10-26 DIAGNOSIS — M5441 Lumbago with sciatica, right side: Secondary | ICD-10-CM | POA: Diagnosis not present

## 2016-10-27 DIAGNOSIS — M5441 Lumbago with sciatica, right side: Secondary | ICD-10-CM | POA: Diagnosis not present

## 2016-10-27 DIAGNOSIS — M5136 Other intervertebral disc degeneration, lumbar region: Secondary | ICD-10-CM | POA: Diagnosis not present

## 2016-11-01 DIAGNOSIS — M5441 Lumbago with sciatica, right side: Secondary | ICD-10-CM | POA: Diagnosis not present

## 2016-11-01 DIAGNOSIS — M5136 Other intervertebral disc degeneration, lumbar region: Secondary | ICD-10-CM | POA: Diagnosis not present

## 2016-11-09 DIAGNOSIS — M5136 Other intervertebral disc degeneration, lumbar region: Secondary | ICD-10-CM | POA: Diagnosis not present

## 2016-11-10 DIAGNOSIS — N183 Chronic kidney disease, stage 3 (moderate): Secondary | ICD-10-CM | POA: Diagnosis not present

## 2016-11-11 DIAGNOSIS — M5441 Lumbago with sciatica, right side: Secondary | ICD-10-CM | POA: Diagnosis not present

## 2016-11-11 DIAGNOSIS — M5136 Other intervertebral disc degeneration, lumbar region: Secondary | ICD-10-CM | POA: Diagnosis not present

## 2016-11-16 DIAGNOSIS — M109 Gout, unspecified: Secondary | ICD-10-CM | POA: Diagnosis not present

## 2016-11-16 DIAGNOSIS — M5136 Other intervertebral disc degeneration, lumbar region: Secondary | ICD-10-CM | POA: Diagnosis not present

## 2016-11-16 DIAGNOSIS — M5416 Radiculopathy, lumbar region: Secondary | ICD-10-CM | POA: Diagnosis not present

## 2016-11-16 DIAGNOSIS — E876 Hypokalemia: Secondary | ICD-10-CM | POA: Diagnosis not present

## 2016-11-16 DIAGNOSIS — G8929 Other chronic pain: Secondary | ICD-10-CM | POA: Diagnosis not present

## 2016-11-16 DIAGNOSIS — M5441 Lumbago with sciatica, right side: Secondary | ICD-10-CM | POA: Diagnosis not present

## 2016-11-21 ENCOUNTER — Other Ambulatory Visit: Payer: Self-pay | Admitting: Neurological Surgery

## 2016-11-21 DIAGNOSIS — M4316 Spondylolisthesis, lumbar region: Secondary | ICD-10-CM | POA: Diagnosis not present

## 2016-11-21 DIAGNOSIS — I10 Essential (primary) hypertension: Secondary | ICD-10-CM | POA: Diagnosis not present

## 2016-11-27 ENCOUNTER — Other Ambulatory Visit: Payer: Self-pay | Admitting: Internal Medicine

## 2016-12-09 DIAGNOSIS — G8929 Other chronic pain: Secondary | ICD-10-CM | POA: Diagnosis not present

## 2016-12-09 DIAGNOSIS — M5441 Lumbago with sciatica, right side: Secondary | ICD-10-CM | POA: Diagnosis not present

## 2016-12-09 DIAGNOSIS — M5416 Radiculopathy, lumbar region: Secondary | ICD-10-CM | POA: Diagnosis not present

## 2016-12-19 ENCOUNTER — Other Ambulatory Visit: Payer: Medicare Other | Admitting: Internal Medicine

## 2016-12-22 ENCOUNTER — Encounter: Payer: Medicare Other | Admitting: Internal Medicine

## 2016-12-23 DIAGNOSIS — Z23 Encounter for immunization: Secondary | ICD-10-CM | POA: Diagnosis not present

## 2016-12-23 DIAGNOSIS — M109 Gout, unspecified: Secondary | ICD-10-CM | POA: Diagnosis not present

## 2016-12-23 DIAGNOSIS — N183 Chronic kidney disease, stage 3 (moderate): Secondary | ICD-10-CM | POA: Diagnosis not present

## 2016-12-23 DIAGNOSIS — I701 Atherosclerosis of renal artery: Secondary | ICD-10-CM | POA: Diagnosis not present

## 2016-12-23 DIAGNOSIS — I129 Hypertensive chronic kidney disease with stage 1 through stage 4 chronic kidney disease, or unspecified chronic kidney disease: Secondary | ICD-10-CM | POA: Diagnosis not present

## 2016-12-25 ENCOUNTER — Other Ambulatory Visit: Payer: Self-pay | Admitting: Internal Medicine

## 2016-12-27 ENCOUNTER — Telehealth: Payer: Self-pay

## 2016-12-27 MED ORDER — TRIAMTERENE-HCTZ 75-50 MG PO TABS: 1.0000 | ORAL_TABLET | Freq: Every day | ORAL | 3 refills | Status: DC

## 2016-12-27 NOTE — Telephone Encounter (Signed)
Received fax from [facility] in regards to a refill on Tria/HCTZ 75-50mg  for patient. Medication was refilled per Dr. Verlene Mayer request. Sent 1 year

## 2016-12-30 ENCOUNTER — Encounter (HOSPITAL_COMMUNITY): Admission: RE | Payer: Self-pay | Source: Ambulatory Visit

## 2016-12-30 ENCOUNTER — Inpatient Hospital Stay (HOSPITAL_COMMUNITY): Admission: RE | Admit: 2016-12-30 | Payer: Medicare Other | Source: Ambulatory Visit | Admitting: Neurological Surgery

## 2016-12-30 SURGERY — POSTERIOR LUMBAR FUSION 1 LEVEL
Anesthesia: General | Site: Back

## 2017-01-11 ENCOUNTER — Telehealth: Payer: Self-pay | Admitting: Internal Medicine

## 2017-01-11 NOTE — Telephone Encounter (Signed)
Patient is calling and wants to have nerve conduction studies on the left hand.  She has already had them on the right hand probably 15 years or more.  She cannot remember who did this.  States she had surgery on the right hand.  She cannot remember who did it.  She thinks it may have been a Dr. Gerilyn Nestle and he has long since retired; but she's just not sure.  She is wanting to go ahead and get the ball rolling for the left hand.  She is having pain in the left hand.  Left thumb is pretty numb on a regular basis.  She has an appointment to see Dr. Steele Sizer @ [facility] (seeing the PA) but it's not for another month.  She's wanting to go ahead and get the testing done.  ??    She also called Dr. Georgia Lopes, but he will not do anything without approval from you.    She is scheduled to come in to see you for a CPE in January.    Best # for contact:  [phone] or home # [phone] (ok to leave message at either place)

## 2017-01-11 NOTE — Telephone Encounter (Signed)
She needs to wait and see Dr. Amedeo Plenty. I am not sure she needs this test. He is excellent and can decide. She can ask to be placed on cancellation list.

## 2017-01-25 ENCOUNTER — Telehealth: Payer: Self-pay | Admitting: Internal Medicine

## 2017-01-25 NOTE — Telephone Encounter (Signed)
Patient is having an Apicoectomy on Tuesday, 12/11 by Dr. Marveen Reeks, Endodontist @ [phone].  They advised that she should stop her Plavix and Baby ASA 3 days prior to surgery.  She just needs your "ok" in order to do that.  She would stop them on Saturday, 12/8.    Is this ok?    Thank you.    Best # for contact:  [phone]

## 2017-01-26 DIAGNOSIS — G5602 Carpal tunnel syndrome, left upper limb: Secondary | ICD-10-CM | POA: Diagnosis not present

## 2017-01-26 DIAGNOSIS — M79642 Pain in left hand: Secondary | ICD-10-CM | POA: Diagnosis not present

## 2017-01-26 NOTE — Telephone Encounter (Signed)
Called and spoke with [facility] @ Dr. Tarri Glenn office to advise it's ok to stop Plavix and Baby ASA 3 days prior to surgery.  Called patient back to advise as well.

## 2017-02-13 ENCOUNTER — Other Ambulatory Visit: Payer: Self-pay | Admitting: Internal Medicine

## 2017-02-13 DIAGNOSIS — R7302 Impaired glucose tolerance (oral): Secondary | ICD-10-CM

## 2017-02-13 DIAGNOSIS — I10 Essential (primary) hypertension: Secondary | ICD-10-CM

## 2017-02-13 DIAGNOSIS — Z0000 Encounter for general adult medical examination without abnormal findings: Secondary | ICD-10-CM

## 2017-02-13 DIAGNOSIS — Z1321 Encounter for screening for nutritional disorder: Secondary | ICD-10-CM

## 2017-02-13 DIAGNOSIS — E785 Hyperlipidemia, unspecified: Secondary | ICD-10-CM

## 2017-02-13 DIAGNOSIS — Z1329 Encounter for screening for other suspected endocrine disorder: Secondary | ICD-10-CM

## 2017-02-16 DIAGNOSIS — K046 Periapical abscess with sinus: Secondary | ICD-10-CM | POA: Diagnosis not present

## 2017-02-16 DIAGNOSIS — K045 Chronic apical periodontitis: Secondary | ICD-10-CM | POA: Diagnosis not present

## 2017-02-24 ENCOUNTER — Other Ambulatory Visit: Payer: Medicare Other | Admitting: Internal Medicine

## 2017-02-24 DIAGNOSIS — R7302 Impaired glucose tolerance (oral): Secondary | ICD-10-CM | POA: Diagnosis not present

## 2017-02-24 DIAGNOSIS — Z1321 Encounter for screening for nutritional disorder: Secondary | ICD-10-CM | POA: Diagnosis not present

## 2017-02-24 DIAGNOSIS — Z0000 Encounter for general adult medical examination without abnormal findings: Secondary | ICD-10-CM | POA: Diagnosis not present

## 2017-02-24 DIAGNOSIS — E785 Hyperlipidemia, unspecified: Secondary | ICD-10-CM | POA: Diagnosis not present

## 2017-02-24 DIAGNOSIS — Z1329 Encounter for screening for other suspected endocrine disorder: Secondary | ICD-10-CM

## 2017-02-24 DIAGNOSIS — I10 Essential (primary) hypertension: Secondary | ICD-10-CM

## 2017-02-27 ENCOUNTER — Encounter: Payer: Self-pay | Admitting: Internal Medicine

## 2017-02-27 ENCOUNTER — Ambulatory Visit (INDEPENDENT_AMBULATORY_CARE_PROVIDER_SITE_OTHER): Payer: Medicare Other | Admitting: Internal Medicine

## 2017-02-27 VITALS — BP 130/88 | HR 79 | Ht 59.5 in | Wt 149.0 lb

## 2017-02-27 DIAGNOSIS — H903 Sensorineural hearing loss, bilateral: Secondary | ICD-10-CM | POA: Diagnosis not present

## 2017-02-27 DIAGNOSIS — J309 Allergic rhinitis, unspecified: Secondary | ICD-10-CM

## 2017-02-27 DIAGNOSIS — N183 Chronic kidney disease, stage 3 unspecified: Secondary | ICD-10-CM

## 2017-02-27 DIAGNOSIS — Z8679 Personal history of other diseases of the circulatory system: Secondary | ICD-10-CM | POA: Diagnosis not present

## 2017-02-27 DIAGNOSIS — E7849 Other hyperlipidemia: Secondary | ICD-10-CM | POA: Diagnosis not present

## 2017-02-27 DIAGNOSIS — R7302 Impaired glucose tolerance (oral): Secondary | ICD-10-CM | POA: Diagnosis not present

## 2017-02-27 DIAGNOSIS — Z0000 Encounter for general adult medical examination without abnormal findings: Secondary | ICD-10-CM

## 2017-02-27 DIAGNOSIS — M48061 Spinal stenosis, lumbar region without neurogenic claudication: Secondary | ICD-10-CM

## 2017-02-27 DIAGNOSIS — Z96641 Presence of right artificial hip joint: Secondary | ICD-10-CM

## 2017-02-27 DIAGNOSIS — I10 Essential (primary) hypertension: Secondary | ICD-10-CM | POA: Diagnosis not present

## 2017-02-27 DIAGNOSIS — Z8739 Personal history of other diseases of the musculoskeletal system and connective tissue: Secondary | ICD-10-CM

## 2017-02-27 LAB — POCT URINALYSIS DIPSTICK
Appearance: NORMAL
BILIRUBIN UA: NEGATIVE
Blood, UA: NEGATIVE
Glucose, UA: NEGATIVE
KETONES UA: NEGATIVE
Leukocytes, UA: NEGATIVE
NITRITE UA: NEGATIVE
Odor: NORMAL
PROTEIN UA: NEGATIVE
SPEC GRAV UA: 1.015 (ref 1.010–1.025)
UROBILINOGEN UA: 0.2 U/dL
pH, UA: 6 (ref 5.0–8.0)

## 2017-03-01 LAB — LIPID PANEL
CHOL/HDL RATIO: 3.3 (calc) (ref <5.0)
CHOLESTEROL: 185 mg/dL (ref <200)
HDL: 56 mg/dL (ref >50)
LDL CHOLESTEROL (CALC): 108 mg/dL — AB
Non-HDL Cholesterol (Calc): 129 mg/dL (calc) (ref <130)
TRIGLYCERIDES: 111 mg/dL (ref <150)

## 2017-03-01 LAB — COMPLETE METABOLIC PANEL WITH GFR
AG RATIO: 2 (calc) (ref 1.0–2.5)
ALBUMIN MSPROF: 4.5 g/dL (ref 3.6–5.1)
ALKALINE PHOSPHATASE (APISO): 83 U/L (ref 33–130)
ALT: 24 U/L (ref 6–29)
AST: 29 U/L (ref 10–35)
BILIRUBIN TOTAL: 0.5 mg/dL (ref 0.2–1.2)
BUN / CREAT RATIO: 23 (calc) — AB (ref 6–22)
BUN: 29 mg/dL — ABNORMAL HIGH (ref 7–25)
CHLORIDE: 100 mmol/L (ref 98–110)
CO2: 30 mmol/L (ref 20–32)
Calcium: 10.3 mg/dL (ref 8.6–10.4)
Creat: 1.28 mg/dL — ABNORMAL HIGH (ref 0.60–0.93)
GFR, Est African American: 48 mL/min/{1.73_m2} — ABNORMAL LOW (ref >=60)
GFR, Est Non African American: 41 mL/min/{1.73_m2} — ABNORMAL LOW (ref >=60)
GLOBULIN: 2.3 g/dL (ref 1.9–3.7)
Glucose, Bld: 88 mg/dL (ref 65–99)
POTASSIUM: 3.5 mmol/L (ref 3.5–5.3)
Sodium: 140 mmol/L (ref 135–146)
Total Protein: 6.8 g/dL (ref 6.1–8.1)

## 2017-03-01 LAB — CBC WITH DIFFERENTIAL/PLATELET
Basophils Absolute: 62 cells/uL (ref 0–200)
Basophils Relative: 1.1 %
EOS PCT: 5.2 %
Eosinophils Absolute: 291 cells/uL (ref 15–500)
HCT: 42.1 % (ref 35.0–45.0)
Hemoglobin: 14.4 g/dL (ref 11.7–15.5)
Lymphs Abs: 1282 cells/uL (ref 850–3900)
MCH: 28.6 pg (ref 27.0–33.0)
MCHC: 34.2 g/dL (ref 32.0–36.0)
MCV: 83.7 fL (ref 80.0–100.0)
MPV: 10.2 fL (ref 7.5–12.5)
Monocytes Relative: 9.4 %
NEUTROS PCT: 61.4 %
Neutro Abs: 3438 cells/uL (ref 1500–7800)
PLATELETS: 320 10*3/uL (ref 140–400)
RBC: 5.03 10*6/uL (ref 3.80–5.10)
RDW: 15.7 % — AB (ref 11.0–15.0)
Total Lymphocyte: 22.9 %
WBC: 5.6 10*3/uL (ref 3.8–10.8)
WBCMIX: 526 {cells}/uL (ref 200–950)

## 2017-03-01 LAB — TEST AUTHORIZATION

## 2017-03-01 LAB — URIC ACID: Uric Acid, Serum: 6.9 mg/dL (ref 2.5–7.0)

## 2017-03-01 LAB — HEMOGLOBIN A1C
Hgb A1c MFr Bld: 5.8 % of total Hgb — ABNORMAL HIGH (ref <5.7)
Mean Plasma Glucose: 120 (calc)
eAG (mmol/L): 6.6 (calc)

## 2017-03-01 LAB — VITAMIN D 25 HYDROXY (VIT D DEFICIENCY, FRACTURES): Vit D, 25-Hydroxy: 32 ng/mL (ref 30–100)

## 2017-03-01 LAB — TSH: TSH: 2.03 m[IU]/L (ref 0.40–4.50)

## 2017-03-22 NOTE — Progress Notes (Signed)
Subjective:    Patient ID: Charlotte Duarte, female    DOB: Dec 03, 1940, 77 y.o.   MRN: [id number]  HPI 77 year old Female in today for health maintenance exam and evaluation of medical issues.  She had  right hip arthroplasty by Dr. Ninfa Linden in 2013 and has done well.  History of chronic kidney disease stage III, hypertension, hyperlipidemia, GE reflux, renal artery stenosis status post left renal artery PTCA and stenting in 2005 with PCI of an in-stent stenosis in 2007.  She takes Plavix for renal artery stenosis.  History of epidural steroid injection for lumbar spinal stenosis 2013.  Hypertension treated with Lotensin and Maxzide.  Creatinine ranges around 1.4.  No proteinuria.  Kidney sizes have been stable.  She has had serial assessments done of renal artery since 2008 which have been stable.  Patient had breast augmentation in 1980 and had an implant removal in 2007.  Arthroscopy of both knees 1990 1995 with Dr. Para March.  History of allergic rhinitis.  Had colonoscopy in 2017.  Social alcohol consumption consisting of 10 alcoholic beverages a week.  She is a lacto-ovo- vegetarian.  History of right bundle branch block on EKG.  History of GE reflux.  Had Zostavax vaccine in 2007.  Carpal tunnel release 1993.  Smoking in 1975.  She is employed as a Tax inspector.  She has a Education officer, community and is married.  Family history: Mother died of acute MI at age 52.  Mother with history of breast cancer and aneurysm in her leg.  Brother with history of brain aneurysm.  Another brother with history of ALS.  Both parents have history of hypertension.  Renal artery stenosis is followed by [facility].    Review of Systems  Constitutional: Negative.   All other systems reviewed and are negative.      Objective:   Physical Exam  Constitutional: She is oriented to person, place, and time. She appears well-developed and well-nourished. No distress.    HENT:  Head: Normocephalic and atraumatic.  Right Ear: External ear normal.  Left Ear: External ear normal.  Mouth/Throat: Oropharynx is clear and moist.  Eyes: Conjunctivae and EOM are normal. Pupils are equal, round, and reactive to light. Right eye exhibits no discharge. Left eye exhibits no discharge. No scleral icterus.  Neck: Neck supple. No JVD present. No thyromegaly present.  Cardiovascular: Normal rate, regular rhythm, normal heart sounds and intact distal pulses.  No murmur heard. Pulmonary/Chest: Effort normal and breath sounds normal. No respiratory distress. She has no wheezes. She has no rales.  Abdominal: Soft. Bowel sounds are normal. She exhibits no distension and no mass. There is no tenderness. There is no rebound and no guarding.  No bruits  Musculoskeletal: She exhibits no edema.  Lymphadenopathy:    She has no cervical adenopathy.  Neurological: She is alert and oriented to person, place, and time. She has normal reflexes. No cranial nerve deficit. Coordination normal.  Skin: Skin is warm and dry. No rash noted. She is not diaphoretic.  Psychiatric: She has a normal mood and affect. Her behavior is normal. Judgment and thought content normal.  Vitals reviewed.         Assessment & Plan:  History of renal artery stenosis followed by [facility] stable with chronic kidney disease stage III  Hypertension  Hyperlipidemia  History of allergic rhinitis  History of lumbar spinal stenosis and sciatica  History of right hip arthroplasty  History of breast  augmentation with removal of implants 2007  History of impaired glucose tolerance  GE reflux  Plan: Creatinine is stable at 1.28 and a year ago was 1.58.  Lipid panel stable.  Hemoglobin A1c 5.8%  She is asking for serum uric acid it was 6.9 and previously was 9.3 several months ago.  TSH is normal  Subjective:   Patient presents for Medicare Annual/Subsequent preventive  examination.  Review Past Medical/Family/Social:   Risk Factors  Current exercise habits:  Dietary issues discussed:   Cardiac risk factors:  Depression Screen  (Note: if answer to either of the following is "Yes", a more complete depression screening is indicated)   Over the past two weeks, have you felt down, depressed or hopeless? No  Over the past two weeks, have you felt little interest or pleasure in doing things? No Have you lost interest or pleasure in daily life? No Do you often feel hopeless? No Do you cry easily over simple problems? No   Activities of Daily Living  In your present state of health, do you have any difficulty performing the following activities?:   Driving? No  Managing money? No  Feeding yourself? No  Getting from bed to chair? No  Climbing a flight of stairs? No  Preparing food and eating?: No  Bathing or showering? No  Getting dressed: No  Getting to the toilet? No  Using the toilet:No  Moving around from place to place: No  In the past year have you fallen or had a near fall?:No  Are you sexually active? yes Do you have more than one partner? No   Hearing Difficulties:  Yes- now have wear hearing aids Do you often ask people to speak up or repeat themselves?  OK with hearing aids Do you experience ringing or noises in your ears? No  Do you have difficulty understanding soft or whispered voices? Not if wearing hearing aids Do you feel that you have a problem with memory? No Do you often misplace items? No    Home Safety:  Do you have a smoke alarm at your residence? Yes Do you have grab bars in the bathroom?  No Do you have throw rugs in your house?  Yes   Cognitive Testing  Alert? Yes Normal Appearance?Yes  Oriented to person? Yes Place? Yes  Time? Yes  Recall of three objects? Yes  Can perform simple calculations? Yes  Displays appropriate judgment?Yes  Can read the correct time from a watch face?Yes   List the Names of  Other Physician/Practitioners you currently use:  See referral list for the physicians patient is currently seeing.   [facility]  Review of Systems: See above   Objective:     General appearance: Appears  younger than stated age Head: Normocephalic, without obvious abnormality, atraumatic  Eyes: conj clear, EOMi PEERLA  Ears: normal TM's and external ear canals both ears  Nose: Nares normal. Septum midline. Mucosa normal. No drainage or sinus tenderness.  Throat: lips, mucosa, and tongue normal; teeth and gums normal  Neck: no adenopathy, no carotid bruit, no JVD, supple, symmetrical, trachea midline and thyroid not enlarged, symmetric, no tenderness/mass/nodules  No CVA tenderness.  Lungs: clear to auscultation bilaterally   Heart: regular rate and rhythm, S1, S2 normal, no murmur, click, rub or gallop  Abdomen: soft, non-tender; bowel sounds normal; no masses, no organomegaly  Musculoskeletal: ROM normal in all joints, no crepitus, no deformity, Normal muscle strengthen. Back  is symmetric, no curvature. Skin: Skin  color, texture, turgor normal. No rashes or lesions  Lymph nodes: Cervical, supraclavicular, and axillary nodes normal.  Neurologic: CN 2 -12 Normal, Normal symmetric reflexes. Normal coordination and gait  Psych: Alert & Oriented x 3, Mood appear stable.    Assessment:    Annual wellness medicare exam   Plan:    During the course of the visit the patient was educated and counseled about appropriate screening and preventive services including:   Annual flu vaccine  Annual mammogram     Patient Instructions (the written plan) was given to the patient.  Medicare Attestation  I have personally reviewed:  The patient's medical and social history  Their use of alcohol, tobacco or illicit drugs  Their current medications and supplements  The patient's functional ability including ADLs,fall risks, home safety risks, cognitive, and hearing and  visual impairment  Diet and physical activities  Evidence for depression or mood disorders  The patient's weight, height, BMI, and visual acuity have been recorded in the chart. I have made referrals, counseling, and provided education to the patient based on review of the above and I have provided the patient with a written personalized care plan for preventive services.

## 2017-03-22 NOTE — Patient Instructions (Signed)
It was a pleasure to see you today.  Continue same medications and return in 6 months. 

## 2017-03-30 DIAGNOSIS — I701 Atherosclerosis of renal artery: Secondary | ICD-10-CM | POA: Diagnosis not present

## 2017-03-30 DIAGNOSIS — E785 Hyperlipidemia, unspecified: Secondary | ICD-10-CM | POA: Diagnosis not present

## 2017-03-30 DIAGNOSIS — I129 Hypertensive chronic kidney disease with stage 1 through stage 4 chronic kidney disease, or unspecified chronic kidney disease: Secondary | ICD-10-CM | POA: Diagnosis not present

## 2017-03-30 DIAGNOSIS — N183 Chronic kidney disease, stage 3 (moderate): Secondary | ICD-10-CM | POA: Diagnosis not present

## 2017-04-11 DIAGNOSIS — D229 Melanocytic nevi, unspecified: Secondary | ICD-10-CM | POA: Diagnosis not present

## 2017-04-11 DIAGNOSIS — L821 Other seborrheic keratosis: Secondary | ICD-10-CM | POA: Diagnosis not present

## 2017-04-11 DIAGNOSIS — L570 Actinic keratosis: Secondary | ICD-10-CM | POA: Diagnosis not present

## 2017-05-15 ENCOUNTER — Ambulatory Visit (INDEPENDENT_AMBULATORY_CARE_PROVIDER_SITE_OTHER): Payer: Medicare Other | Admitting: Internal Medicine

## 2017-05-15 ENCOUNTER — Encounter: Payer: Self-pay | Admitting: Internal Medicine

## 2017-05-15 VITALS — BP 130/78 | HR 60 | Temp 98.0°F | Ht 59.5 in | Wt 148.0 lb

## 2017-05-15 DIAGNOSIS — R7302 Impaired glucose tolerance (oral): Secondary | ICD-10-CM | POA: Diagnosis not present

## 2017-05-15 DIAGNOSIS — N183 Chronic kidney disease, stage 3 unspecified: Secondary | ICD-10-CM

## 2017-05-15 DIAGNOSIS — K219 Gastro-esophageal reflux disease without esophagitis: Secondary | ICD-10-CM | POA: Diagnosis not present

## 2017-05-15 DIAGNOSIS — K5900 Constipation, unspecified: Secondary | ICD-10-CM | POA: Diagnosis not present

## 2017-05-15 DIAGNOSIS — I10 Essential (primary) hypertension: Secondary | ICD-10-CM

## 2017-05-15 DIAGNOSIS — Z8679 Personal history of other diseases of the circulatory system: Secondary | ICD-10-CM | POA: Diagnosis not present

## 2017-05-15 NOTE — Patient Instructions (Addendum)
Try Miralax daily and Nexium 20 mg daily. Call in 2 weeks with progress report.

## 2017-05-15 NOTE — Addendum Note (Signed)
Addended by: Mady Haagensen on: 05/15/2017 12:32 PM   Modules accepted: Orders

## 2017-05-15 NOTE — Progress Notes (Signed)
   Subjective:    Patient ID: Charlotte Duarte, female    DOB: 09-06-1940, 77 y.o.   MRN: [id number]  HPI Patient in today complaining of GE reflux and constipation symptoms.  Does not always feel she evacuates fully with every bowel movement.  Has cut back on consumption of cheese and eggs.  Is not taking laxatives or stool softeners.  Has been taking omeprazole every other day for GE reflux but is simply not helping.  She thinks constipation is aggravating GE reflux.  Reviewed foods that aggravate reflux.  We reviewed activities that aggravate reflux including eating late at night.    Review of Systems no vomiting.  No diarrhea.  Has water brash at times.     Objective:   Physical Exam Abdomen is obese soft nondistended without hepatosplenomegaly masses or tenderness       Assessment & Plan:  GE reflux-H. pylori breath test done.  Try Nexium 20 mg daily and follow-up within 2 weeks.  Patient is to call me with how she is doing  Constipation-she will try MiraLAX daily.

## 2017-05-16 LAB — H. PYLORI BREATH TEST: H. PYLORI BREATH TEST: NOT DETECTED

## 2017-05-25 ENCOUNTER — Other Ambulatory Visit: Payer: Self-pay | Admitting: Internal Medicine

## 2017-06-11 ENCOUNTER — Other Ambulatory Visit: Payer: Self-pay | Admitting: Internal Medicine

## 2017-07-27 ENCOUNTER — Ambulatory Visit (INDEPENDENT_AMBULATORY_CARE_PROVIDER_SITE_OTHER): Payer: Medicare Other | Admitting: Internal Medicine

## 2017-07-27 VITALS — BP 130/84 | HR 71 | Temp 98.8°F | Ht 60.0 in | Wt 149.0 lb

## 2017-07-27 DIAGNOSIS — J029 Acute pharyngitis, unspecified: Secondary | ICD-10-CM

## 2017-07-27 LAB — POCT RAPID STREP A (OFFICE): Rapid Strep A Screen: NEGATIVE

## 2017-07-28 DIAGNOSIS — N183 Chronic kidney disease, stage 3 (moderate): Secondary | ICD-10-CM | POA: Diagnosis not present

## 2017-07-28 DIAGNOSIS — I701 Atherosclerosis of renal artery: Secondary | ICD-10-CM | POA: Diagnosis not present

## 2017-07-28 DIAGNOSIS — E785 Hyperlipidemia, unspecified: Secondary | ICD-10-CM | POA: Diagnosis not present

## 2017-07-28 DIAGNOSIS — I129 Hypertensive chronic kidney disease with stage 1 through stage 4 chronic kidney disease, or unspecified chronic kidney disease: Secondary | ICD-10-CM | POA: Diagnosis not present

## 2017-08-18 ENCOUNTER — Encounter: Payer: Self-pay | Admitting: Internal Medicine

## 2017-08-18 NOTE — Progress Notes (Signed)
   Subjective:    Patient ID: Charlotte Duarte, female    DOB: Jul 07, 1940, 77 y.o.   MRN: [id number]  HPI 77 year old Female with history of stage III chronic kidney disease in today with sore throat.  Has been taking Nexium for GE reflux with good relief.  Has had some malaise and fatigue with a sore throat.  No cough or significant congestion.  No ear pain.    Review of Systems see above     Objective:   Physical Exam Pharynx is slightly injected without exudate.  Rapid strep screen negative.  TMs are clear.  Neck is supple without adenopathy.  Chest clear to auscultation.       Assessment & Plan:  Acute non-strep pharyngitis  Plan: Patient has negative rapid strep test.  This presumably is a viral syndrome and should resolve in the next few days.  She will call if not getting better.

## 2017-08-18 NOTE — Patient Instructions (Signed)
Rest and drink plenty of fluids.  Tylenol if needed for sore throat pain.  Call if not better in 5 to 7 days or if symptoms worsen.

## 2017-08-21 ENCOUNTER — Encounter: Payer: Self-pay | Admitting: Internal Medicine

## 2017-08-21 ENCOUNTER — Ambulatory Visit (INDEPENDENT_AMBULATORY_CARE_PROVIDER_SITE_OTHER): Payer: Medicare Other | Admitting: Internal Medicine

## 2017-08-21 VITALS — BP 110/80 | HR 63 | Ht 60.0 in | Wt 148.0 lb

## 2017-08-21 DIAGNOSIS — M7918 Myalgia, other site: Secondary | ICD-10-CM

## 2017-08-21 DIAGNOSIS — W57XXXA Bitten or stung by nonvenomous insect and other nonvenomous arthropods, initial encounter: Secondary | ICD-10-CM | POA: Diagnosis not present

## 2017-08-21 DIAGNOSIS — M7062 Trochanteric bursitis, left hip: Secondary | ICD-10-CM

## 2017-08-21 NOTE — Addendum Note (Signed)
Addended by: Mady Haagensen on: 08/21/2017 04:34 PM   Modules accepted: Orders

## 2017-08-21 NOTE — Addendum Note (Signed)
Addended by: Mady Haagensen on: 08/21/2017 04:14 PM   Modules accepted: Orders

## 2017-08-21 NOTE — Patient Instructions (Signed)
Referral to orthopedist regarding left hip pain.  At her request, Lyme titers and RMSF titer drawn.

## 2017-08-21 NOTE — Progress Notes (Signed)
   Subjective:    Patient ID: Charlotte Duarte, female    DOB: [birth year], 77 y.o.   MRN: [id number]  HPI    Review of Systems     Objective:   Physical Exam        Assessment & Plan:

## 2017-08-22 LAB — ROCKY MTN SPOTTED FVR ABS PNL(IGG+IGM)
RMSF IgG: NOT DETECTED
RMSF IgM: NOT DETECTED

## 2017-08-22 LAB — B. BURGDORFI ANTIBODIES: B burgdorferi Ab IgG+IgM: <0.9 index

## 2017-08-28 ENCOUNTER — Other Ambulatory Visit: Payer: Medicare Other | Admitting: Internal Medicine

## 2017-08-30 ENCOUNTER — Other Ambulatory Visit: Payer: Self-pay | Admitting: Internal Medicine

## 2017-08-30 DIAGNOSIS — I10 Essential (primary) hypertension: Secondary | ICD-10-CM

## 2017-08-30 DIAGNOSIS — Z79899 Other long term (current) drug therapy: Secondary | ICD-10-CM

## 2017-08-30 DIAGNOSIS — Z5181 Encounter for therapeutic drug level monitoring: Secondary | ICD-10-CM

## 2017-08-30 DIAGNOSIS — E7849 Other hyperlipidemia: Secondary | ICD-10-CM

## 2017-08-30 DIAGNOSIS — R7302 Impaired glucose tolerance (oral): Secondary | ICD-10-CM

## 2017-08-31 ENCOUNTER — Ambulatory Visit: Payer: Medicare Other | Admitting: Internal Medicine

## 2017-09-04 ENCOUNTER — Encounter (INDEPENDENT_AMBULATORY_CARE_PROVIDER_SITE_OTHER): Payer: Self-pay | Admitting: Orthopaedic Surgery

## 2017-09-04 ENCOUNTER — Ambulatory Visit (INDEPENDENT_AMBULATORY_CARE_PROVIDER_SITE_OTHER): Payer: Medicare Other | Admitting: Orthopaedic Surgery

## 2017-09-04 VITALS — Ht 60.0 in | Wt 148.0 lb

## 2017-09-04 DIAGNOSIS — M7062 Trochanteric bursitis, left hip: Secondary | ICD-10-CM

## 2017-09-04 MED ORDER — LIDOCAINE HCL 1 % IJ SOLN
3.0000 mL | INTRAMUSCULAR | Status: AC | PRN
  Administered 2017-09-04: 3 mL

## 2017-09-04 MED ORDER — METHYLPREDNISOLONE ACETATE 40 MG/ML IJ SUSP
40.0000 mg | INTRAMUSCULAR | Status: AC | PRN
  Administered 2017-09-04: 40 mg via INTRA_ARTICULAR

## 2017-09-04 NOTE — Progress Notes (Signed)
Office Visit Note   Patient: Charlotte Duarte           Date of Birth: [birth year]           MRN: [id number] Visit Date: 09/04/2017              Requested by: Elby Showers, MD [address] [place], [place] [id number] PCP: Elby Showers, MD   Assessment & Plan: Visit Diagnoses:  1. Trochanteric bursitis, left hip     Plan: She agreed with trying steroid injection as well.  She said she will do the stretching exercises.  I spoke with her about trying a steroid injection of the trochanteric area for left hip.  Also showed her stretching exercises to try.  She will follow-up as needed however, if she continues to have pain she will come back and see [place] and then I would like to get an AP pelvis and lateral of her left hip as well as send her to physical therapy if warranted.  All question concerns were answered and addressed.  Follow-Up Instructions: Return if symptoms worsen or fail to improve.   Orders:  Orders Placed This Encounter  Procedures  . Large Joint Inj   No orders of the defined types were placed in this encounter.     Procedures: Large Joint Inj: L greater trochanter on 09/04/2017 3:10 PM Indications: pain and diagnostic evaluation Details: 22 G 1.5 in needle, lateral approach  Arthrogram: No  Medications: 3 mL lidocaine 1 %; 40 mg methylPREDNISolone acetate 40 MG/ML Outcome: tolerated well, no immediate complications Procedure, treatment alternatives, risks and benefits explained, specific risks discussed. Consent was given by the patient. Immediately prior to procedure a time out was called to verify the correct patient, procedure, equipment, support staff and site/side marked as required. Patient was prepped and draped in the usual sterile fashion.       Clinical Data: No additional findings.   Subjective: Chief Complaint  Patient presents with  . Left Leg - Pain    Lateral hip  Patient is someone I seen before but is been a long time.  6 years  ago we performed a right total hip arthroplasty.  She is been having left hip pain on the lateral aspect of her left hip but not in the groin at all.  She has not had much change in her activities but did recently get extra donkeys and has been doing a lot of shoveling and that may have contributed to this pain.  She does not sleep on that side at night.  She denies any groin pain at all.  She says this does not feel like her hip did on the right side when she needed hip replacement surgery.  HPI  Review of Systems She currently denies any headache, chest pain, shortness of breath, fever, chills, nausea, vomiting.  Objective: Vital Signs: Ht 5' (1.524 m)   Wt 148 lb (67.1 kg)   BMI 28.90 kg/m   Physical Exam She is alert and oriented x3 with no acute distress examination of her left hip shows fluid and full range of motion with no pain in the groin at all.  Her pain is only to palpation of the trochanteric area and the iliotibial band. Ortho Exam  Specialty Comments:  No specialty comments available.  Imaging: No results found.   PMFS History: Patient Active Problem List   Diagnosis Date Noted  . Trochanteric bursitis, left hip 09/04/2017  . Acute renal failure  superimposed on stage 3 chronic kidney disease ([place]) 03/29/2015  . Essential hypertension 03/29/2015  . Allergic reaction 03/29/2015  . Constipation 03/29/2015  . Dyslipidemia 03/29/2015  . GERD (gastroesophageal reflux disease) 03/29/2015  . Acute renal failure (ARF) ([place]) 03/27/2015  . Acute kidney injury ([place]) 03/27/2015  . Acute renal failure ([place])   . Azotemia   . Arthritis pain of hand 09/13/2012  . 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 Spinal stenosis 09/20/2011  . History of renal artery stenosis 09/20/2011  . Impaired glucose tolerance 09/20/2011  . Allergic rhinitis 09/20/2011  . GE reflux 02/21/2011  . Atrophic vaginitis   . SUI (stress urinary incontinence, female)   . Hypertension 07/27/2010    . Hyperlipidemia 07/27/2010  . BACK PAIN 03/09/2010  . HIP PAIN, RIGHT 02/23/2010  . ITBS, RIGHT KNEE 02/23/2010  . SHOULDER PAIN, RIGHT 05/11/2009  . BICEPS TENDINITIS, RIGHT 05/11/2009  . RENAL DISEASE, CHRONIC, STAGE III 04/20/2009   Past Medical History:  Diagnosis Date  . Allergy   . Arthritis   . Atrophic vaginitis   . Cataract   . 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 GERD (gastroesophageal reflux disease)   . H/O hiatal hernia   . 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 Hyperlipidemia   . Hypertension   . Renal artery stenosis ([place])   . SUI (stress urinary incontinence, female)     Family History  Problem Relation Age of Onset  . Mental illness Mother   . Breast cancer Mother 10  . Hypertension Mother   . Heart disease Father   . ALS Brother   . Stroke Brother     Past Surgical History:  Procedure Laterality Date  . AUGMENTATION MAMMAPLASTY  1980    both breasts implants REMOVED IN 2005  . BILATERAL CAPSULECTOMY  2008  . [place]   right  . CATARACT EXTRACTION  20 yrs ago   both eyes   . EYE SURGERY    . KNEE ARTHROSCOPY     BOTH KNEES  . RENAL STINT     RENAL STINT LEFT RENAL ARTERY IN 2005.  RENAL STINT REPLACE 03/2005.  Marland Kitchen TOTAL HIP ARTHROPLASTY  11/11/2011   Procedure: TOTAL HIP ARTHROPLASTY ANTERIOR APPROACH;  Surgeon: Mcarthur Rossetti, MD;  Location: [facility];  Service: Orthopedics;  Laterality: Right;  Right Total Hip Arthroplasty   Social History   Occupational History  . Not on file  Tobacco Use  . Smoking status: Former Smoker    Types: Cigarettes    Last attempt to quit: 07/27/1970    Years since quitting: 47.1  . Smokeless tobacco: Never Used  Substance and Sexual Activity  . Alcohol use: Yes    Alcohol/week: 6.0 oz    Types: 10 Glasses of wine per week    Comment: 1-2 glasses of wine daily  . Drug use: No  . Sexual activity: Yes    Birth control/protection: Post-menopausal

## 2017-09-05 ENCOUNTER — Other Ambulatory Visit: Payer: Medicare Other | Admitting: Internal Medicine

## 2017-09-05 DIAGNOSIS — Z79899 Other long term (current) drug therapy: Secondary | ICD-10-CM | POA: Diagnosis not present

## 2017-09-05 DIAGNOSIS — I10 Essential (primary) hypertension: Secondary | ICD-10-CM

## 2017-09-05 DIAGNOSIS — E7849 Other hyperlipidemia: Secondary | ICD-10-CM

## 2017-09-05 DIAGNOSIS — R7302 Impaired glucose tolerance (oral): Secondary | ICD-10-CM | POA: Diagnosis not present

## 2017-09-05 DIAGNOSIS — Z5181 Encounter for therapeutic drug level monitoring: Secondary | ICD-10-CM

## 2017-09-06 LAB — LIPID PANEL
Cholesterol: 186 mg/dL (ref <200)
HDL: 58 mg/dL (ref >50)
LDL Cholesterol (Calc): 113 mg/dL (calc) — ABNORMAL HIGH
NON-HDL CHOLESTEROL (CALC): 128 mg/dL (ref <130)
Total CHOL/HDL Ratio: 3.2 (calc) (ref <5.0)
Triglycerides: 68 mg/dL (ref <150)

## 2017-09-06 LAB — HEPATIC FUNCTION PANEL
AG RATIO: 2.1 (calc) (ref 1.0–2.5)
ALKALINE PHOSPHATASE (APISO): 94 U/L (ref 33–130)
ALT: 25 U/L (ref 6–29)
AST: 31 U/L (ref 10–35)
Albumin: 4.8 g/dL (ref 3.6–5.1)
BILIRUBIN TOTAL: 0.5 mg/dL (ref 0.2–1.2)
Bilirubin, Direct: 0.1 mg/dL (ref 0.0–0.2)
Globulin: 2.3 g/dL (calc) (ref 1.9–3.7)
Indirect Bilirubin: 0.4 mg/dL (calc) (ref 0.2–1.2)
Total Protein: 7.1 g/dL (ref 6.1–8.1)

## 2017-09-06 LAB — MICROALBUMIN / CREATININE URINE RATIO
Creatinine, Urine: 74 mg/dL (ref 20–275)
MICROALB UR: 4.1 mg/dL
MICROALB/CREAT RATIO: 55 ug/mg{creat} — AB (ref <30)

## 2017-09-06 LAB — HEMOGLOBIN A1C
EAG (MMOL/L): 6.5 (calc)
Hgb A1c MFr Bld: 5.7 % of total Hgb — ABNORMAL HIGH (ref <5.7)
MEAN PLASMA GLUCOSE: 117 (calc)

## 2017-09-07 ENCOUNTER — Encounter: Payer: Self-pay | Admitting: Internal Medicine

## 2017-09-07 ENCOUNTER — Ambulatory Visit (INDEPENDENT_AMBULATORY_CARE_PROVIDER_SITE_OTHER): Payer: Medicare Other | Admitting: Internal Medicine

## 2017-09-07 VITALS — BP 110/70 | HR 72 | Temp 98.2°F | Ht 60.0 in | Wt 148.0 lb

## 2017-09-07 DIAGNOSIS — I10 Essential (primary) hypertension: Secondary | ICD-10-CM

## 2017-09-07 DIAGNOSIS — N183 Chronic kidney disease, stage 3 unspecified: Secondary | ICD-10-CM

## 2017-09-07 DIAGNOSIS — E7849 Other hyperlipidemia: Secondary | ICD-10-CM | POA: Diagnosis not present

## 2017-09-07 DIAGNOSIS — M7062 Trochanteric bursitis, left hip: Secondary | ICD-10-CM

## 2017-09-07 DIAGNOSIS — R7302 Impaired glucose tolerance (oral): Secondary | ICD-10-CM | POA: Diagnosis not present

## 2017-09-07 NOTE — Progress Notes (Signed)
   Subjective:    Patient ID: Charlotte Duarte, female    DOB: 1940/10/14, 77 y.o.   MRN: [id number]  HPI 77 year old Female for 6 month follow up.  Recently seen for tick bites July 1 and tick titers were negative.  She feels well.  History of impaired glucose tolerance.  Hemoglobin A1c is excellent at 5.7%.  LDL cholesterol is 113 and previously was 108 6 months ago.  Liver functions are normal on statin medication.  Says Dr. Lorrene Reid has taken her off of Plavix.  She has been on this for renal artery stenosis status post left renal artery PTCA and stenting in 2005.  She also had in-stent stenosis 2007.  Dr. Lorrene Reid felt that anticoagulation was no longer necessary.  Patient has chronic kidney disease and creatinine has been stable.  In January serum creatinine was 1.28.  She is enjoying her animals.  Feels well.    Review of Systems has noticed some mild facial swelling after a cortisone shot given for trochanteric bursitis this past Monday.  I do not see a lot of facial swelling.     Objective:   Physical Exam  Neck is supple without JVD thyromegaly or carotid bruits.  Chest clear to auscultation.  Cardiac exam regular rate and rhythm.  Extremities without edema.      Assessment & Plan:  Impaired glucose tolerance-stable with diet  Hyperlipidemia-continue statin medication   Chronic kidney disease stage III-stable and followed by Dr. Lorrene Reid  Trochanteric bursitis-treated with cortisone injection by orthopedist and improving  Essential hypertension-stable on current regimen  Plan: Return in January for physical examination and fasting labs  as well as Medicare wellness visit.

## 2017-09-13 ENCOUNTER — Encounter: Payer: Self-pay | Admitting: Internal Medicine

## 2017-09-13 DIAGNOSIS — Z803 Family history of malignant neoplasm of breast: Secondary | ICD-10-CM | POA: Diagnosis not present

## 2017-09-13 DIAGNOSIS — Z1231 Encounter for screening mammogram for malignant neoplasm of breast: Secondary | ICD-10-CM | POA: Diagnosis not present

## 2017-10-01 ENCOUNTER — Other Ambulatory Visit: Payer: Self-pay

## 2017-10-01 ENCOUNTER — Encounter (HOSPITAL_COMMUNITY): Payer: Self-pay | Admitting: Emergency Medicine

## 2017-10-01 ENCOUNTER — Ambulatory Visit (HOSPITAL_COMMUNITY)
Admission: EM | Admit: 2017-10-01 | Discharge: 2017-10-01 | Disposition: A | Discharge status: Home / Self Care | Payer: Medicare Other | Attending: Family Medicine | Admitting: Family Medicine

## 2017-10-01 DIAGNOSIS — Z96641 Presence of right artificial hip joint: Secondary | ICD-10-CM | POA: Insufficient documentation

## 2017-10-01 DIAGNOSIS — Z880 Allergy status to penicillin: Secondary | ICD-10-CM | POA: Diagnosis not present

## 2017-10-01 DIAGNOSIS — E785 Hyperlipidemia, unspecified: Secondary | ICD-10-CM | POA: Diagnosis not present

## 2017-10-01 DIAGNOSIS — N898 Other specified noninflammatory disorders of vagina: Secondary | ICD-10-CM | POA: Diagnosis not present

## 2017-10-01 DIAGNOSIS — Z818 Family history of other mental and behavioral disorders: Secondary | ICD-10-CM | POA: Insufficient documentation

## 2017-10-01 DIAGNOSIS — Z8249 Family history of ischemic heart disease and other diseases of the circulatory system: Secondary | ICD-10-CM | POA: Insufficient documentation

## 2017-10-01 DIAGNOSIS — Z9841 Cataract extraction status, right eye: Secondary | ICD-10-CM | POA: Insufficient documentation

## 2017-10-01 DIAGNOSIS — Z79899 Other long term (current) drug therapy: Secondary | ICD-10-CM | POA: Diagnosis not present

## 2017-10-01 DIAGNOSIS — Z9842 Cataract extraction status, left eye: Secondary | ICD-10-CM | POA: Diagnosis not present

## 2017-10-01 DIAGNOSIS — Z87891 Personal history of nicotine dependence: Secondary | ICD-10-CM | POA: Diagnosis not present

## 2017-10-01 DIAGNOSIS — Z881 Allergy status to other antibiotic agents status: Secondary | ICD-10-CM | POA: Diagnosis not present

## 2017-10-01 DIAGNOSIS — L298 Other pruritus: Secondary | ICD-10-CM | POA: Diagnosis not present

## 2017-10-01 DIAGNOSIS — Z888 Allergy status to other drugs, medicaments and biological substances status: Secondary | ICD-10-CM | POA: Diagnosis not present

## 2017-10-01 DIAGNOSIS — I129 Hypertensive chronic kidney disease with stage 1 through stage 4 chronic kidney disease, or unspecified chronic kidney disease: Secondary | ICD-10-CM | POA: Diagnosis not present

## 2017-10-01 DIAGNOSIS — K219 Gastro-esophageal reflux disease without esophagitis: Secondary | ICD-10-CM | POA: Diagnosis not present

## 2017-10-01 DIAGNOSIS — N183 Chronic kidney disease, stage 3 (moderate): Secondary | ICD-10-CM | POA: Diagnosis not present

## 2017-10-01 DIAGNOSIS — N390 Urinary tract infection, site not specified: Secondary | ICD-10-CM | POA: Insufficient documentation

## 2017-10-01 DIAGNOSIS — Z7982 Long term (current) use of aspirin: Secondary | ICD-10-CM | POA: Diagnosis not present

## 2017-10-01 DIAGNOSIS — R350 Frequency of micturition: Secondary | ICD-10-CM | POA: Diagnosis present

## 2017-10-01 DIAGNOSIS — Z823 Family history of stroke: Secondary | ICD-10-CM | POA: Diagnosis not present

## 2017-10-01 DIAGNOSIS — Z820 Family history of epilepsy and other diseases of the nervous system: Secondary | ICD-10-CM | POA: Insufficient documentation

## 2017-10-01 LAB — POCT URINALYSIS DIP (DEVICE)
Bilirubin Urine: NEGATIVE
Glucose, UA: NEGATIVE mg/dL
Ketones, ur: NEGATIVE mg/dL
NITRITE: NEGATIVE
PH: 7 (ref 5.0–8.0)
PROTEIN: NEGATIVE mg/dL
Specific Gravity, Urine: 1.01 (ref 1.005–1.030)
UROBILINOGEN UA: 0.2 mg/dL (ref 0.0–1.0)

## 2017-10-01 MED ORDER — AMOXICILLIN 250 MG PO CAPS: 250.0000 mg | ORAL_CAPSULE | Freq: Two times a day (BID) | ORAL | 0 refills | Status: AC

## 2017-10-01 NOTE — ED Triage Notes (Signed)
Pt reports urinary frequency for the last few weeks. About four days ago she noted some vaginal itching.  She is having no discharge and no other urinary symptoms.

## 2017-10-01 NOTE — Discharge Instructions (Signed)
We will start treatment today for urinary tract infection with amoxicillin as this was effective per culture with your last urinary tract infection.  I have sent this urine to also be cultured, and we would call you if sensitivities indicate any change to treatment.  We are testing the vaginal for yeast and bacteria which could be the source of your vaginal itching, and will call if any positive findings with this test as well.  A probioitic may be helpful, especially while on antibiotics.  If symptoms worsen or do not improve in the next week to return to be seen or to follow up with your PCP.   Have a nice trip to [place]!

## 2017-10-01 NOTE — ED Provider Notes (Signed)
[facility]    CSN: [id number] Arrival date & time: 10/01/17  1206     History   Chief Complaint Chief Complaint  Patient presents with  . Urinary Frequency  . Vaginal Itching    HPI Charlotte Duarte is a 77 y.o. female.   Charlotte Duarte presents with complaints of frequency which has been ongoing for the past two weeks. No pain with urination. No back or abdominal pain. No nausea, vomiting or diarrhea. Has had intermittent headache's recently. No blood in urine. Also with vaginal itching. No bleeding. No pain to vagina. Has had uti's in the past. Per chart review in 2017 had similar presentation with uti symptoms as well as vaginal itching, with normal wet prep. Developed AKI with use of keflex. With most recent UTI culture indicated sensitivity to amoxacillin which patient tolerated and resolved symptoms. Hx of atrophic vaginitis, GERD, htn, renal artery stenosis, stress incontinence, kidney disease.    ROS per HPI.      Past Medical History:  Diagnosis Date  . Allergy   . Arthritis   . Atrophic vaginitis   . Cataract   . 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 GERD (gastroesophageal reflux disease)   . H/O hiatal hernia   . 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 Hyperlipidemia   . Hypertension   . Renal artery stenosis ([facility])   . SUI (stress urinary incontinence, female)     Patient Active Problem List   Diagnosis Date Noted  . Trochanteric bursitis, left hip 09/04/2017  . Acute renal failure superimposed on stage 3 chronic kidney disease ([facility]) 03/29/2015  . Essential hypertension 03/29/2015  . Allergic reaction 03/29/2015  . Constipation 03/29/2015  . Dyslipidemia 03/29/2015  . GERD (gastroesophageal reflux disease) 03/29/2015  . Acute renal failure (ARF) ([facility]) 03/27/2015  . Acute kidney injury ([place]) 03/27/2015  . Acute renal failure ([facility])   . Azotemia   . Arthritis pain of hand 09/13/2012  . 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 Spinal  stenosis 09/20/2011  . History of renal artery stenosis 09/20/2011  . Impaired glucose tolerance 09/20/2011  . Allergic rhinitis 09/20/2011  . GE reflux 02/21/2011  . Atrophic vaginitis   . SUI (stress urinary incontinence, female)   . Hypertension 07/27/2010  . Hyperlipidemia 07/27/2010  . BACK PAIN 03/09/2010  . HIP PAIN, RIGHT 02/23/2010  . ITBS, RIGHT KNEE 02/23/2010  . SHOULDER PAIN, RIGHT 05/11/2009  . BICEPS TENDINITIS, RIGHT 05/11/2009  . RENAL DISEASE, CHRONIC, STAGE III 04/20/2009    Past Surgical History:  Procedure Laterality Date  . AUGMENTATION MAMMAPLASTY  1980    both breasts implants REMOVED IN 2005  . BILATERAL CAPSULECTOMY  2008  . [place]   right  . CATARACT EXTRACTION  20 yrs ago   both eyes   . EYE SURGERY    . KNEE ARTHROSCOPY     BOTH KNEES  . RENAL STINT     RENAL STINT LEFT RENAL ARTERY IN 2005.  RENAL STINT REPLACE 03/2005.  Marland Kitchen TOTAL HIP ARTHROPLASTY  11/11/2011   Procedure: TOTAL HIP ARTHROPLASTY ANTERIOR APPROACH;  Surgeon: Mcarthur Rossetti, MD;  Location: [facility];  Service: Orthopedics;  Laterality: Right;  Right Total Hip Arthroplasty    OB History    Gravida  1   Para      Term      Preterm      AB  1   Living  SAB      TAB      Ectopic      Multiple      Live Births               Home Medications    Prior to Admission medications   Medication Sig Start Date End Date Taking? Authorizing Provider  allopurinol (ZYLOPRIM) 100 MG tablet TAKE 1 TABLET BY MOUTH  DAILY 06/12/17  Yes Baxley, Cresenciano Lick, MD  aspirin EC 81 MG tablet Take 81 mg by mouth daily.   Yes [provider]  esomeprazole (NEXIUM) 20 MG packet Take 20 mg by mouth daily before breakfast.   Yes [provider]  fexofenadine (ALLEGRA) 180 MG tablet Take 180 mg by mouth daily. Reported on 04/09/2015   Yes [provider]  rosuvastatin (CRESTOR) 5 MG tablet TAKE 1 TABLET BY MOUTH  DAILY 05/25/17  Yes Baxley,  Cresenciano Lick, MD  Sodium Chloride-Xylitol (XLEAR SINUS CARE SPRAY NA) Place 1 puff into the nose daily.   Yes [provider]  triamterene-hydrochlorothiazide (MAXZIDE) 75-50 MG tablet Take 1 tablet daily with breakfast by mouth. Patient taking differently: Take 1 tablet by mouth daily. 1 tab po daily 12/27/16  Yes Baxley, Cresenciano Lick, MD  TURMERIC PO Take by mouth.   Yes [provider]  amoxicillin (AMOXIL) 250 MG capsule Take 1 capsule (250 mg total) by mouth 2 (two) times daily for 7 days. 10/01/17 10/08/17  Zigmund Gottron, NP    Family History Family History  Problem Relation Age of Onset  . Mental illness Mother   . Breast cancer Mother 87  . Hypertension Mother   . Heart disease Father   . ALS Brother   . Stroke Brother     Social History Social History   Tobacco Use  . Smoking status: Former Smoker    Types: Cigarettes    Last attempt to quit: 07/27/1970    Years since quitting: 47.2  . Smokeless tobacco: Never Used  Substance Use Topics  . Alcohol use: Yes    Alcohol/week: 10.0 standard drinks    Types: 10 Glasses of wine per week    Comment: 1-2 glasses of wine daily  . Drug use: No     Allergies   Keflex [cephalexin]; Augmentin [amoxicillin-pot clavulanate]; and Tamiflu [oseltamivir phosphate]   Review of Systems Review of Systems   Physical Exam Triage Vital Signs ED Triage Vitals  Enc Vitals Group     BP 10/01/17 1220 (!) 149/87     Pulse Rate 10/01/17 1220 64     Resp --      Temp 10/01/17 1220 98.2 F (36.8 C)     Temp Source 10/01/17 1220 Oral     SpO2 10/01/17 1220 98 %     Weight --      Height --      Head Circumference --      Peak Flow --      Pain Score 10/01/17 1218 0     Pain Loc --      Pain Edu? --      Excl. in [place]? --    No data found.  Updated Vital Signs BP (!) 149/87 (BP Location: Right Arm)   Pulse 64   Temp 98.2 F (36.8 C) (Oral)   SpO2 98%   Visual Acuity Right Eye Distance:   Left Eye Distance:     Bilateral Distance:    Right Eye Near:   Left Eye  Near:    Bilateral Near:     Physical Exam  Constitutional: She is oriented to person, place, and time. She appears well-developed and well-nourished. No distress.  Cardiovascular: Normal rate, regular rhythm and normal heart sounds.  Pulmonary/Chest: Effort normal and breath sounds normal.  Abdominal: Soft. There is no tenderness.  Genitourinary: Vagina normal. No vaginal discharge found.  Genitourinary Comments: Vaginal with only thin clear discharge noted; no skin breakdown or rash to vulva   Neurological: She is alert and oriented to person, place, and time.  Skin: Skin is warm and dry.     UC Treatments / Results  Labs (all labs ordered are listed, but only abnormal results are displayed) Labs Reviewed  POCT URINALYSIS DIP (DEVICE) - Abnormal; Notable for the following components:      Result Value   Hgb urine dipstick TRACE (*)    Leukocytes, UA LARGE (*)    All other components within normal limits  URINE CULTURE  CERVICOVAGINAL ANCILLARY ONLY    EKG None  Radiology No results found.  Procedures Procedures (including critical care time)  Medications Ordered in UC Medications - No data to display  Initial Impression / Assessment and Plan / UC Course  I have reviewed the triage vital signs and the nursing notes.  Pertinent labs & imaging results that were available during my care of the patient were reviewed by me and considered in my medical decision making (see chart for details).     leuks with hgb to urine today, will treat for UTI with culture pending. Will treat with amoxicillin as this has previously been sensitive with cultures. Vaginal cytology collected and pending. No obvious indication of yeast or bv at this time, will await cytology results. Will notify of any positive findings and if any changes to treatment are needed.  If symptoms worsen or do not improve in the next week to return to be seen or  to follow up with PCP.  Patient verbalized understanding and agreeable to plan.    Final Clinical Impressions(s) / UC Diagnoses   Final diagnoses:  Lower urinary tract infectious disease  Vaginal itching     Discharge Instructions     We will start treatment today for urinary tract infection with amoxicillin as this was effective per culture with your last urinary tract infection.  I have sent this urine to also be cultured, and we would call you if sensitivities indicate any change to treatment.  We are testing the vaginal for yeast and bacteria which could be the source of your vaginal itching, and will call if any positive findings with this test as well.  A probioitic may be helpful, especially while on antibiotics.  If symptoms worsen or do not improve in the next week to return to be seen or to follow up with your PCP.   Have a nice trip to [place]!   ED Prescriptions    Medication Sig Dispense Auth. Provider   amoxicillin (AMOXIL) 250 MG capsule Take 1 capsule (250 mg total) by mouth 2 (two) times daily for 7 days. 14 capsule Zigmund Gottron, NP     Controlled Substance Prescriptions [place] Controlled Substance Registry consulted? Not Applicable   Zigmund Gottron, NP 10/01/17 1252

## 2017-10-02 LAB — CERVICOVAGINAL ANCILLARY ONLY
BACTERIAL VAGINITIS: NEGATIVE
CANDIDA VAGINITIS: NEGATIVE

## 2017-10-03 ENCOUNTER — Telehealth (HOSPITAL_COMMUNITY): Payer: Self-pay

## 2017-10-03 LAB — URINE CULTURE: Culture: 20000 — AB

## 2017-10-03 NOTE — Telephone Encounter (Signed)
Urine culture positive for E.Coli. This was treated with amoxicillin at [facility] visit per Dr. Mannie Stabile.  Attempted to reach patient. No answer at this time.

## 2017-10-20 ENCOUNTER — Encounter: Payer: Self-pay | Admitting: Internal Medicine

## 2017-10-20 ENCOUNTER — Ambulatory Visit (INDEPENDENT_AMBULATORY_CARE_PROVIDER_SITE_OTHER): Payer: Medicare Other | Admitting: Internal Medicine

## 2017-10-20 VITALS — BP 120/80 | HR 66 | Temp 98.4°F | Ht 60.0 in | Wt 148.0 lb

## 2017-10-20 DIAGNOSIS — R300 Dysuria: Secondary | ICD-10-CM | POA: Diagnosis not present

## 2017-10-20 DIAGNOSIS — N390 Urinary tract infection, site not specified: Secondary | ICD-10-CM | POA: Diagnosis not present

## 2017-10-20 DIAGNOSIS — R8290 Unspecified abnormal findings in urine: Secondary | ICD-10-CM

## 2017-10-20 DIAGNOSIS — R350 Frequency of micturition: Secondary | ICD-10-CM

## 2017-10-20 LAB — POCT URINALYSIS DIPSTICK
Bilirubin, UA: NEGATIVE
GLUCOSE UA: NEGATIVE
Ketones, UA: NEGATIVE
Nitrite, UA: NEGATIVE
PH UA: 6.5 (ref 5.0–8.0)
Protein, UA: POSITIVE — AB
SPEC GRAV UA: 1.01 (ref 1.010–1.025)
UROBILINOGEN UA: 0.2 U/dL

## 2017-10-20 MED ORDER — CIPROFLOXACIN HCL 250 MG PO TABS: 250.0000 mg | ORAL_TABLET | Freq: Two times a day (BID) | ORAL | 0 refills | Status: DC

## 2017-10-20 NOTE — Progress Notes (Signed)
   Subjective:    Patient ID: Charlotte Duarte, female    DOB: [birth year], 77 y.o.   MRN: [id number]  HPI Seen at Urgent Care August 11 treated with Amoxicillin for 7 days for urinary frequency and abnormal urinalysis.Urine culture grew 20,000 col/ml E.coli sensitive to Amoxicillin.Hx CKD Stage 3 followed at [facility]. Vaginal exam at urgent care showing no evidence of bleeding.  Patient improved but does not feel she ever got completely well.  Now he in today with urinary frequency and some dysuria at the end of stream.  No fever or shaking chills. Urine dipstick today shows 2+ LE and protein is present.  Moderate blood is present  Review of Systems no nausea and vomiting     Objective:   Physical Exam  No CVA tenderness.  Urinalysis is abnormal and culture was sent       Assessment & Plan:  Acute UTI  Stage III chronic kidney disease  Plan: Cipro 250 mg twice daily for 10 days.  Culture pending.

## 2017-10-20 NOTE — Patient Instructions (Addendum)
Culture pending.  Cipro 250 mg twice daily for 10 days.

## 2017-10-22 LAB — URINE CULTURE
MICRO NUMBER:: 91041542
SPECIMEN QUALITY:: ADEQUATE

## 2017-10-24 ENCOUNTER — Telehealth: Payer: Self-pay | Admitting: Internal Medicine

## 2017-10-24 NOTE — Telephone Encounter (Signed)
Please call her back. This is a different bacteria that causes UTI. Prople can have different bacteria causing UTI usually from fecal flora in the genital area.No further treatment needed. organism is sensitive to Cipro. Had left Charlotte Duarte a message to call her today with results

## 2017-10-24 NOTE — Telephone Encounter (Signed)
Patient is active in My Chart.  She saw your note about the Klebsiella UTI being sensitive to Cipro.  She said that most always when she has a UTI, she has had EColi.  So, she is concerned as to how she got the Klebsiella and what that means?    I do have her down for appointment on Friday at 11:45 for [place].

## 2017-10-25 NOTE — Telephone Encounter (Signed)
Spoke with patient and provided message to her from Dr. Renold Genta regarding the Klebsiella.  Patient states she is feeling much better.  It just scared her since she had never seen this organism before.  She had also been reading on the internet.  So, we moved her appointment back to 9/12 @ 12:00.

## 2017-10-27 ENCOUNTER — Ambulatory Visit: Payer: Medicare Other | Admitting: Internal Medicine

## 2017-11-02 ENCOUNTER — Ambulatory Visit: Payer: Medicare Other | Admitting: Internal Medicine

## 2017-11-02 ENCOUNTER — Ambulatory Visit (INDEPENDENT_AMBULATORY_CARE_PROVIDER_SITE_OTHER): Payer: Medicare Other | Admitting: Internal Medicine

## 2017-11-02 ENCOUNTER — Encounter: Payer: Self-pay | Admitting: Internal Medicine

## 2017-11-02 VITALS — BP 120/80 | HR 78 | Temp 98.1°F | Ht 60.0 in | Wt 148.0 lb

## 2017-11-02 DIAGNOSIS — R8290 Unspecified abnormal findings in urine: Secondary | ICD-10-CM

## 2017-11-02 DIAGNOSIS — N3000 Acute cystitis without hematuria: Secondary | ICD-10-CM | POA: Diagnosis not present

## 2017-11-02 DIAGNOSIS — Z23 Encounter for immunization: Secondary | ICD-10-CM | POA: Diagnosis not present

## 2017-11-02 LAB — POCT URINALYSIS DIPSTICK
Appearance: NORMAL
Bilirubin, UA: NEGATIVE
Blood, UA: NEGATIVE
Glucose, UA: NEGATIVE
Ketones, UA: NEGATIVE
LEUKOCYTES UA: NEGATIVE
NITRITE UA: NEGATIVE
Odor: NORMAL
PH UA: 6.5 (ref 5.0–8.0)
PROTEIN UA: NEGATIVE
SPEC GRAV UA: 1.015 (ref 1.010–1.025)
UROBILINOGEN UA: 0.2 U/dL

## 2017-11-02 NOTE — Progress Notes (Signed)
   Subjective:    Patient ID: Charlotte Duarte, female    DOB: [birth year], 77 y.o.   MRN: [id number]  HPI She was here on August 30 with urinary tract infection symptoms.  Culture grew Klebsiella oxytocin which was sensitive to Cipro which she was on. She read on the Internet that Klebsiella infection could means that her immune system was compromised and was alarmed.  Initially was treated at urgent care for E. coli UTI on  August 11 with Amoxicillin.  Subsequently developed recurrent symptoms and was seen here August 30.  She is now feeling better.  Urine dipstick today is normal.   Review of Systems see above     Objective:   Physical Exam  Not examined but spent 10 minutes speaking with her about these issues.  Talked with her about causes of urinary tract infections.      Assessment & Plan:  Recurrent UTI  Plan: UTI resolved.  No further treatment necessary.  Flu vaccine given.

## 2017-11-02 NOTE — Patient Instructions (Addendum)
It was a pleasure to see you today.  Urinary tract infection has resolved.  Flu vaccine given.

## 2017-12-24 ENCOUNTER — Other Ambulatory Visit: Payer: Self-pay | Admitting: Internal Medicine

## 2017-12-30 ENCOUNTER — Other Ambulatory Visit: Payer: Self-pay | Admitting: Internal Medicine

## 2018-01-23 DIAGNOSIS — M109 Gout, unspecified: Secondary | ICD-10-CM | POA: Diagnosis not present

## 2018-01-23 DIAGNOSIS — N183 Chronic kidney disease, stage 3 (moderate): Secondary | ICD-10-CM | POA: Diagnosis not present

## 2018-01-23 DIAGNOSIS — E785 Hyperlipidemia, unspecified: Secondary | ICD-10-CM | POA: Diagnosis not present

## 2018-01-23 DIAGNOSIS — I129 Hypertensive chronic kidney disease with stage 1 through stage 4 chronic kidney disease, or unspecified chronic kidney disease: Secondary | ICD-10-CM | POA: Diagnosis not present

## 2018-01-26 DIAGNOSIS — D229 Melanocytic nevi, unspecified: Secondary | ICD-10-CM | POA: Diagnosis not present

## 2018-01-26 DIAGNOSIS — L570 Actinic keratosis: Secondary | ICD-10-CM | POA: Diagnosis not present

## 2018-02-27 ENCOUNTER — Other Ambulatory Visit: Payer: Medicare Other | Admitting: Internal Medicine

## 2018-02-27 DIAGNOSIS — N183 Chronic kidney disease, stage 3 unspecified: Secondary | ICD-10-CM

## 2018-02-27 DIAGNOSIS — E7800 Pure hypercholesterolemia, unspecified: Secondary | ICD-10-CM

## 2018-02-27 DIAGNOSIS — E7849 Other hyperlipidemia: Secondary | ICD-10-CM

## 2018-02-27 DIAGNOSIS — J309 Allergic rhinitis, unspecified: Secondary | ICD-10-CM

## 2018-02-27 DIAGNOSIS — R7302 Impaired glucose tolerance (oral): Secondary | ICD-10-CM | POA: Diagnosis not present

## 2018-02-27 DIAGNOSIS — I10 Essential (primary) hypertension: Secondary | ICD-10-CM | POA: Diagnosis not present

## 2018-02-27 DIAGNOSIS — M48061 Spinal stenosis, lumbar region without neurogenic claudication: Secondary | ICD-10-CM

## 2018-02-28 LAB — CBC WITH DIFFERENTIAL/PLATELET
Absolute Monocytes: 548 cells/uL (ref 200–950)
BASOS PCT: 0.9 %
Basophils Absolute: 59 cells/uL (ref 0–200)
Eosinophils Absolute: 317 cells/uL (ref 15–500)
Eosinophils Relative: 4.8 %
HCT: 39 % (ref 35.0–45.0)
Hemoglobin: 13.3 g/dL (ref 11.7–15.5)
Lymphs Abs: 1498 cells/uL (ref 850–3900)
MCH: 27.8 pg (ref 27.0–33.0)
MCHC: 34.1 g/dL (ref 32.0–36.0)
MCV: 81.4 fL (ref 80.0–100.0)
MONOS PCT: 8.3 %
MPV: 10.1 fL (ref 7.5–12.5)
Neutro Abs: 4178 cells/uL (ref 1500–7800)
Neutrophils Relative %: 63.3 %
PLATELETS: 323 10*3/uL (ref 140–400)
RBC: 4.79 10*6/uL (ref 3.80–5.10)
RDW: 14.5 % (ref 11.0–15.0)
Total Lymphocyte: 22.7 %
WBC: 6.6 10*3/uL (ref 3.8–10.8)

## 2018-02-28 LAB — COMPLETE METABOLIC PANEL WITH GFR
AG Ratio: 2.1 (calc) (ref 1.0–2.5)
ALT: 24 U/L (ref 6–29)
AST: 29 U/L (ref 10–35)
Albumin: 4.5 g/dL (ref 3.6–5.1)
Alkaline phosphatase (APISO): 94 U/L (ref 33–130)
BILIRUBIN TOTAL: 0.5 mg/dL (ref 0.2–1.2)
BUN/Creatinine Ratio: 18 (calc) (ref 6–22)
BUN: 25 mg/dL (ref 7–25)
CHLORIDE: 100 mmol/L (ref 98–110)
CO2: 27 mmol/L (ref 20–32)
Calcium: 9.7 mg/dL (ref 8.6–10.4)
Creat: 1.4 mg/dL — ABNORMAL HIGH (ref 0.60–0.93)
GFR, EST AFRICAN AMERICAN: 42 mL/min/{1.73_m2} — AB (ref >=60)
GFR, Est Non African American: 37 mL/min/{1.73_m2} — ABNORMAL LOW (ref >=60)
GLUCOSE: 85 mg/dL (ref 65–99)
Globulin: 2.1 g/dL (calc) (ref 1.9–3.7)
Potassium: 3.2 mmol/L — ABNORMAL LOW (ref 3.5–5.3)
Sodium: 140 mmol/L (ref 135–146)
TOTAL PROTEIN: 6.6 g/dL (ref 6.1–8.1)

## 2018-02-28 LAB — LIPID PANEL
Cholesterol: 187 mg/dL
HDL: 54 mg/dL
LDL Cholesterol (Calc): 111 mg/dL — ABNORMAL HIGH
Non-HDL Cholesterol (Calc): 133 mg/dL — ABNORMAL HIGH
Total CHOL/HDL Ratio: 3.5 (calc)
Triglycerides: 109 mg/dL

## 2018-02-28 LAB — HEMOGLOBIN A1C
Hgb A1c MFr Bld: 5.8 %{Hb} — ABNORMAL HIGH
Mean Plasma Glucose: 120 (calc)
eAG (mmol/L): 6.6 (calc)

## 2018-02-28 LAB — TSH: TSH: 2.61 m[IU]/L (ref 0.40–4.50)

## 2018-03-01 ENCOUNTER — Other Ambulatory Visit: Payer: Medicare Other | Admitting: Internal Medicine

## 2018-03-02 ENCOUNTER — Encounter: Payer: Self-pay | Admitting: Internal Medicine

## 2018-03-02 ENCOUNTER — Ambulatory Visit (INDEPENDENT_AMBULATORY_CARE_PROVIDER_SITE_OTHER): Payer: Medicare Other | Admitting: Internal Medicine

## 2018-03-02 VITALS — BP 120/80 | HR 83 | Ht 60.0 in | Wt 148.0 lb

## 2018-03-02 DIAGNOSIS — Z96641 Presence of right artificial hip joint: Secondary | ICD-10-CM

## 2018-03-02 DIAGNOSIS — Z0000 Encounter for general adult medical examination without abnormal findings: Secondary | ICD-10-CM

## 2018-03-02 DIAGNOSIS — R7989 Other specified abnormal findings of blood chemistry: Secondary | ICD-10-CM | POA: Diagnosis not present

## 2018-03-02 DIAGNOSIS — I10 Essential (primary) hypertension: Secondary | ICD-10-CM

## 2018-03-02 DIAGNOSIS — Z8679 Personal history of other diseases of the circulatory system: Secondary | ICD-10-CM

## 2018-03-02 DIAGNOSIS — Z8739 Personal history of other diseases of the musculoskeletal system and connective tissue: Secondary | ICD-10-CM

## 2018-03-02 DIAGNOSIS — E7800 Pure hypercholesterolemia, unspecified: Secondary | ICD-10-CM

## 2018-03-02 DIAGNOSIS — K219 Gastro-esophageal reflux disease without esophagitis: Secondary | ICD-10-CM

## 2018-03-02 DIAGNOSIS — E876 Hypokalemia: Secondary | ICD-10-CM | POA: Diagnosis not present

## 2018-03-02 DIAGNOSIS — R7302 Impaired glucose tolerance (oral): Secondary | ICD-10-CM

## 2018-03-02 DIAGNOSIS — M48061 Spinal stenosis, lumbar region without neurogenic claudication: Secondary | ICD-10-CM

## 2018-03-02 LAB — POCT URINALYSIS DIPSTICK
APPEARANCE: NEGATIVE
BILIRUBIN UA: NEGATIVE
GLUCOSE UA: NEGATIVE
Ketones, UA: NEGATIVE
Leukocytes, UA: NEGATIVE
Nitrite, UA: NEGATIVE
Odor: NEGATIVE
PH UA: 6.5 (ref 5.0–8.0)
Protein, UA: NEGATIVE
RBC UA: NEGATIVE
SPEC GRAV UA: 1.01 (ref 1.010–1.025)
UROBILINOGEN UA: 0.2 U/dL

## 2018-03-02 NOTE — Progress Notes (Signed)
Subjective:    Patient ID: Charlotte Duarte, female    DOB: 01-09-1941, 78 y.o.   MRN: [id number]  HPI 78 year old Female for Medicare wellness, health maintenance exam and evaluation of medical issues.   Has K 3.2 and will be repeated. Has hx hypokalemia but not on K supplement. Take potassium sparing diuretic. Creatinine elevated with fasting labs but was 1.14 in Dec at Nephrologist. Repeat today.   Has had recent URI but has recovered.  She has history of chronic kidney disease stage III, hypertension, hyperlipidemia, GE reflux, renal artery stenosis status post left renal artery PTCA and stenting in 2005 with PCI of an in-stent stenosis in 2007.  Was treated for a while with Plavix for renal artery stenosis but that was discontinued by nephrologist followed by [place]  History of right hip arthroplasty by Dr. Ninfa Linden in 2013 and is done well.  History of breast augmentation in 1980 and had implant removal in 2007.  Arthroscopic of both knees 1990 and 1995 with Dr. Noemi Chapel.    History of allergic rhinitis.  Had colonoscopy in 2017.  Hypertension treated with Lotensin and Maxide.  History of epidural steroid injection for lumbar spinal stenosis in 2013.  History of right bundle branch block on EKG.  History of GE reflux  Carpal tunnel release 1993.  Social history: Social alcohol consumption consisting of about 10 alcoholic beverages a week.  She is a lactovegetarian.  She has a Education officer, community and is married.  Is employed as a Solicitor.  Family history: Mother died of acute MI at age 5.  Mother had breast cancer and an aneurysm in her leg.  Brother with history of brain aneurysm.  Another brother with history of ALS.  Both parents with history of hypertension.          Review of Systems  Constitutional: Negative.   All other systems reviewed and are negative.      Objective:   Physical Exam Vitals signs reviewed.    Constitutional:      General: She is not in acute distress.    Appearance: Normal appearance.  HENT:     Head: Normocephalic and atraumatic.     Right Ear: Tympanic membrane normal.     Left Ear: Tympanic membrane normal.     Nose: Nose normal.     Mouth/Throat:     Mouth: Mucous membranes are moist.     Pharynx: Oropharynx is clear.  Eyes:     General: No scleral icterus.       Right eye: No discharge.        Left eye: No discharge.     Conjunctiva/sclera: Conjunctivae normal.     Pupils: Pupils are equal, round, and reactive to light.  Neck:     Musculoskeletal: Neck supple. No neck rigidity.     Comments: No thyromegaly Cardiovascular:     Rate and Rhythm: Normal rate and regular rhythm.     Pulses: Normal pulses.     Heart sounds: No murmur.  Pulmonary:     Effort: Pulmonary effort is normal. No respiratory distress.     Breath sounds: Normal breath sounds. No wheezing or rales.     Comments: Breast without masses Abdominal:     General: Bowel sounds are normal.     Palpations: Abdomen is soft. There is no mass.     Tenderness: There is no rebound.     Hernia: No hernia is present.  Musculoskeletal:     Right lower leg: No edema.     Left lower leg: No edema.  Lymphadenopathy:     Cervical: No cervical adenopathy.  Skin:    General: Skin is warm and dry.  Neurological:     General: No focal deficit present.     Mental Status: She is alert and oriented to person, place, and time.     Cranial Nerves: No cranial nerve deficit.     Gait: Gait normal.  Psychiatric:        Mood and Affect: Mood normal.        Behavior: Behavior normal.        Thought Content: Thought content normal.        Judgment: Judgment normal.           Assessment & Plan:  History of renal artery stenosis status post intervention and followed by [facility] and stable.  History of chronic kidney disease stage III.  Used to take Plavix but no longer takes  Plavix.  Essential hypertension-stable on current regimen of Maxide 75/50  Hyperlipidemia treated with low-dose statin  History of allergic rhinitis  History of lumbar spinal stenosis and sciatica--  has had lumbar epidural injection by Dr. Ernestina Patches  Status post right hip arthroplasty by Dr. Ninfa Linden  History of breast augmentation and subsequent removal of the implants  History of impaired glucose tolerance-stable on diet  GE reflux-treated with Nexium  History of gout treated with allopurinol 100 mg daily  Her creatinine is 1.40 and I am going to repeat that when she is well-hydrated.  Her potassium is 3.2.  She is taking potassium sparing diuretic.  I will repeat that as well.  Addendum: Serum creatinine repeated with hydration is 1.31 and potassium is normal at 3.6.  She will follow-up in 6 months.  Subjective:   Patient presents for Medicare Annual/Subsequent preventive examination.  Review Past Medical/Family/Social: See above   Risk Factors  Current exercise habits: Light exercise Dietary issues discussed: Low-fat low carbohydrate  Cardiac risk factors: Family history, hyperlipidemia  Depression Screen  (Note: if answer to either of the following is "Yes", a more complete depression screening is indicated)   Over the past two weeks, have you felt down, depressed or hopeless? No  Over the past two weeks, have you felt little interest or pleasure in doing things? No Have you lost interest or pleasure in daily life? No Do you often feel hopeless? No Do you cry easily over simple problems? No   Activities of Daily Living  In your present state of health, do you have any difficulty performing the following activities?:   Driving? No  Managing money? No  Feeding yourself? No  Getting from bed to chair? No  Climbing a flight of stairs? No  Preparing food and eating?: No  Bathing or showering? No  Getting dressed: No  Getting to the toilet? No  Using the  toilet:No  Moving around from place to place: No  In the past year have you fallen or had a near fall?:No  Are you sexually active? yes Do you have more than one partner? No   Hearing Difficulties: No  Do you often ask people to speak up or repeat themselves? No  Do you experience ringing or noises in your ears? No  Do you have difficulty understanding soft or whispered voices? No  Do you feel that you have a problem with memory? No Do you often misplace items? No  Home Safety:  Do you have a smoke alarm at your residence? Yes Do you have grab bars in the bathroom?  No  Do you have throw rugs in your house?  Yes   Cognitive Testing  Alert? Yes Normal Appearance?Yes  Oriented to person? Yes Place? Yes  Time? Yes  Recall of three objects? Yes  Can perform simple calculations? Yes  Displays appropriate judgment?Yes  Can read the correct time from a watch face?Yes   List the Names of Other Physician/Practitioners you currently use:  See referral list for the physicians patient is currently seeing.   Nephrologist  Review of Systems: See above   Objective:     General appearance: Appears stated age and mildly obese  Head: Normocephalic, without obvious abnormality, atraumatic  Eyes: conj clear, EOMi PEERLA  Ears: normal TM's and external ear canals both ears  Nose: Nares normal. Septum midline. Mucosa normal. No drainage or sinus tenderness.  Throat: lips, mucosa, and tongue normal; teeth and gums normal  Neck: no adenopathy, no carotid bruit, no JVD, supple, symmetrical, trachea midline and thyroid not enlarged, symmetric, no tenderness/mass/nodules  No CVA tenderness.  Lungs: clear to auscultation bilaterally  Breasts: normal appearance, no masses or tenderness, top of the pacemaker on left upper chest. Incision well-healed. It is tender.  Heart: regular rate and rhythm, S1, S2 normal, no murmur, click, rub or gallop  Abdomen: soft, non-tender; bowel sounds normal; no  masses, no organomegaly  Musculoskeletal: ROM normal in all joints, no crepitus, no deformity, Normal muscle strengthen. Back  is symmetric, no curvature. Skin: Skin color, texture, turgor normal. No rashes or lesions  Lymph nodes: Cervical, supraclavicular, and axillary nodes normal.  Neurologic: CN 2 -12 Normal, Normal symmetric reflexes. Normal coordination and gait  Psych: Alert & Oriented x 3, Mood appear stable.    Assessment:    Annual wellness medicare exam   Plan:    During the course of the visit the patient was educated and counseled about appropriate screening and preventive services including:        Patient Instructions (the written plan) was given to the patient.  Medicare Attestation  I have personally reviewed:  The patient's medical and social history  Their use of alcohol, tobacco or illicit drugs  Their current medications and supplements  The patient's functional ability including ADLs,fall risks, home safety risks, cognitive, and hearing and visual impairment  Diet and physical activities  Evidence for depression or mood disorders  The patient's weight, height, BMI, and visual acuity have been recorded in the chart. I have made referrals, counseling, and provided education to the patient based on review of the above and I have provided the patient with a written personalized care plan for preventive services.     Subjective:   Patient presents for Medicare Annual/Subsequent preventive examination.  Review Past Medical/Family/Social:   Risk Factors  Current exercise habits: Light exercise Dietary issues discussed: Low-fat low carbohydrate  Cardiac risk factors:  Depression Screen  (Note: if answer to either of the following is "Yes", a more complete depression screening is indicated)   Over the past two weeks, have you felt down, depressed or hopeless? No  Over the past two weeks, have you felt little interest or pleasure in doing things?  No Have you lost interest or pleasure in daily life? No Do you often feel hopeless? No Do you cry easily over simple problems? No   Activities of Daily Living  In your present state of health,  do you have any difficulty performing the following activities?:   Driving? No  Managing money? No  Feeding yourself? No  Getting from bed to chair? No  Climbing a flight of stairs? No  Preparing food and eating?: No  Bathing or showering? No  Getting dressed: No  Getting to the toilet? No  Using the toilet:No  Moving around from place to place: No  In the past year have you fallen or had a near fall?:No  Are you sexually active? yes Do you have more than one partner? No   Hearing Difficulties: No  Do you often ask people to speak up or repeat themselves? No  Do you experience ringing or noises in your ears? No  Do you have difficulty understanding soft or whispered voices? No  Do you feel that you have a problem with memory? No Do you often misplace items? No    Home Safety:  Do you have a smoke alarm at your residence? Yes Do you have grab bars in the bathroom?  No Do you have throw rugs in your house?  Yes   Cognitive Testing  Alert? Yes Normal Appearance?Yes  Oriented to person? Yes Place? Yes  Time? Yes  Recall of three objects? Yes  Can perform simple calculations? Yes  Displays appropriate judgment?Yes  Can read the correct time from a watch face?Yes   List the Names of Other Physician/Practitioners you currently use:  See referral list for the physicians patient is currently seeing.  Nephrologist at [facility]: See above   Objective:     General appearance: Appears stated age and mildly obese  Head: Normocephalic, without obvious abnormality, atraumatic  Eyes: conj clear, EOMi PEERLA  Ears: normal TM's and external ear canals both ears  Nose: Nares normal. Septum midline. Mucosa normal. No drainage or sinus tenderness.   Throat: lips, mucosa, and tongue normal; teeth and gums normal  Neck: no adenopathy, no carotid bruit, no JVD, supple, symmetrical, trachea midline and thyroid not enlarged, symmetric, no tenderness/mass/nodules  No CVA tenderness.  Lungs: clear to auscultation bilaterally  Breasts: normal appearance, no masses or tenderness Heart: regular rate and rhythm, S1, S2 normal, no murmur, click, rub or gallop  Abdomen: soft, non-tender; bowel sounds normal; no masses, no organomegaly  Musculoskeletal: ROM normal in all joints, no crepitus, no deformity, Normal muscle strengthen. Back  is symmetric, no curvature. Skin: Skin color, texture, turgor normal. No rashes or lesions  Lymph nodes: Cervical, supraclavicular, and axillary nodes normal.  Neurologic: CN 2 -12 Normal, Normal symmetric reflexes. Normal coordination and gait  Psych: Alert & Oriented x 3, Mood appear stable.    Assessment:    Annual wellness medicare exam   Plan:    During the course of the visit the patient was educated and counseled about appropriate screening and preventive services including:   Annual flu vaccine  Annual mammogram     Patient Instructions (the written plan) was given to the patient.  Medicare Attestation  I have personally reviewed:  The patient's medical and social history  Their use of alcohol, tobacco or illicit drugs  Their current medications and supplements  The patient's functional ability including ADLs,fall risks, home safety risks, cognitive, and hearing and visual impairment  Diet and physical activities  Evidence for depression or mood disorders  The patient's weight, height, BMI, and visual acuity have been recorded in the chart. I have made referrals, counseling, and provided education to the patient  based on review of the above and I have provided the patient with a written personalized care plan for preventive services.

## 2018-03-03 LAB — BASIC METABOLIC PANEL
BUN/Creatinine Ratio: 24 (calc) — ABNORMAL HIGH (ref 6–22)
BUN: 32 mg/dL — ABNORMAL HIGH (ref 7–25)
CALCIUM: 10 mg/dL (ref 8.6–10.4)
CHLORIDE: 101 mmol/L (ref 98–110)
CO2: 27 mmol/L (ref 20–32)
Creat: 1.31 mg/dL — ABNORMAL HIGH (ref 0.60–0.93)
Glucose, Bld: 103 mg/dL — ABNORMAL HIGH (ref 65–99)
POTASSIUM: 3.6 mmol/L (ref 3.5–5.3)
SODIUM: 140 mmol/L (ref 135–146)

## 2018-03-24 ENCOUNTER — Other Ambulatory Visit: Payer: Self-pay | Admitting: Internal Medicine

## 2018-03-29 DIAGNOSIS — L570 Actinic keratosis: Secondary | ICD-10-CM | POA: Diagnosis not present

## 2018-04-26 DIAGNOSIS — H26492 Other secondary cataract, left eye: Secondary | ICD-10-CM | POA: Diagnosis not present

## 2018-04-26 DIAGNOSIS — H35361 Drusen (degenerative) of macula, right eye: Secondary | ICD-10-CM | POA: Diagnosis not present

## 2018-05-01 DIAGNOSIS — L570 Actinic keratosis: Secondary | ICD-10-CM | POA: Diagnosis not present

## 2018-05-01 DIAGNOSIS — D229 Melanocytic nevi, unspecified: Secondary | ICD-10-CM | POA: Diagnosis not present

## 2018-05-01 DIAGNOSIS — L821 Other seborrheic keratosis: Secondary | ICD-10-CM | POA: Diagnosis not present

## 2018-09-15 ENCOUNTER — Other Ambulatory Visit: Payer: Self-pay | Admitting: Internal Medicine

## 2018-09-24 DIAGNOSIS — E785 Hyperlipidemia, unspecified: Secondary | ICD-10-CM | POA: Diagnosis not present

## 2018-09-24 DIAGNOSIS — N183 Chronic kidney disease, stage 3 (moderate): Secondary | ICD-10-CM | POA: Diagnosis not present

## 2018-09-24 DIAGNOSIS — M109 Gout, unspecified: Secondary | ICD-10-CM | POA: Diagnosis not present

## 2018-09-24 DIAGNOSIS — I129 Hypertensive chronic kidney disease with stage 1 through stage 4 chronic kidney disease, or unspecified chronic kidney disease: Secondary | ICD-10-CM | POA: Diagnosis not present

## 2018-10-15 ENCOUNTER — Other Ambulatory Visit: Payer: Self-pay | Admitting: Internal Medicine

## 2018-10-15 ENCOUNTER — Other Ambulatory Visit: Payer: Medicare Other | Admitting: Internal Medicine

## 2018-10-15 ENCOUNTER — Other Ambulatory Visit: Payer: Self-pay

## 2018-10-15 DIAGNOSIS — E785 Hyperlipidemia, unspecified: Secondary | ICD-10-CM | POA: Diagnosis not present

## 2018-10-15 DIAGNOSIS — I10 Essential (primary) hypertension: Secondary | ICD-10-CM

## 2018-10-15 DIAGNOSIS — R7302 Impaired glucose tolerance (oral): Secondary | ICD-10-CM

## 2018-10-16 LAB — HEPATIC FUNCTION PANEL
AG Ratio: 2 (calc) (ref 1.0–2.5)
ALT: 20 U/L (ref 6–29)
AST: 28 U/L (ref 10–35)
Albumin: 4.5 g/dL (ref 3.6–5.1)
Alkaline phosphatase (APISO): 84 U/L (ref 37–153)
Bilirubin, Direct: 0.1 mg/dL (ref 0.0–0.2)
Globulin: 2.2 g/dL (calc) (ref 1.9–3.7)
Indirect Bilirubin: 0.5 mg/dL (calc) (ref 0.2–1.2)
Total Bilirubin: 0.6 mg/dL (ref 0.2–1.2)
Total Protein: 6.7 g/dL (ref 6.1–8.1)

## 2018-10-16 LAB — HEMOGLOBIN A1C
Hgb A1c MFr Bld: 6 % of total Hgb — ABNORMAL HIGH (ref <5.7)
Mean Plasma Glucose: 126 (calc)
eAG (mmol/L): 7 (calc)

## 2018-10-16 LAB — LIPID PANEL
Cholesterol: 178 mg/dL (ref <200)
HDL: 50 mg/dL (ref >=50)
LDL Cholesterol (Calc): 106 mg/dL (calc) — ABNORMAL HIGH
Non-HDL Cholesterol (Calc): 128 mg/dL (calc) (ref <130)
Total CHOL/HDL Ratio: 3.6 (calc) (ref <5.0)
Triglycerides: 127 mg/dL (ref <150)

## 2018-10-18 ENCOUNTER — Encounter: Payer: Self-pay | Admitting: Internal Medicine

## 2018-10-18 ENCOUNTER — Other Ambulatory Visit: Payer: Self-pay

## 2018-10-18 ENCOUNTER — Ambulatory Visit (INDEPENDENT_AMBULATORY_CARE_PROVIDER_SITE_OTHER): Payer: Medicare Other | Admitting: Internal Medicine

## 2018-10-18 VITALS — BP 120/80 | HR 83 | Temp 98.3°F | Ht 60.0 in | Wt 148.0 lb

## 2018-10-18 DIAGNOSIS — Z8679 Personal history of other diseases of the circulatory system: Secondary | ICD-10-CM

## 2018-10-18 DIAGNOSIS — I10 Essential (primary) hypertension: Secondary | ICD-10-CM | POA: Diagnosis not present

## 2018-10-18 DIAGNOSIS — N183 Chronic kidney disease, stage 3 unspecified: Secondary | ICD-10-CM

## 2018-10-18 DIAGNOSIS — E7800 Pure hypercholesterolemia, unspecified: Secondary | ICD-10-CM | POA: Diagnosis not present

## 2018-10-18 DIAGNOSIS — L255 Unspecified contact dermatitis due to plants, except food: Secondary | ICD-10-CM | POA: Diagnosis not present

## 2018-10-18 DIAGNOSIS — R7302 Impaired glucose tolerance (oral): Secondary | ICD-10-CM

## 2018-10-18 DIAGNOSIS — K219 Gastro-esophageal reflux disease without esophagitis: Secondary | ICD-10-CM

## 2018-10-18 MED ORDER — PREDNISONE 10 MG PO TABS: ORAL_TABLET | ORAL | 0 refills | Status: DC

## 2018-10-18 NOTE — Patient Instructions (Addendum)
It was a pleasure to see you today. Have flu vaccine at pharmacy in about a month. Take prednisone in tapering course as directed 6-5-4-3-2-1. Continue same meds and RTC for annual exam in January 2021.  Continue other medications as prescribed.

## 2018-10-18 NOTE — Progress Notes (Signed)
   Subjective:    Patient ID: Scharlene Corn, female    DOB: [birth year], 78 y.o.   MRN: [id number]  HPI 78 year old Female for 26-month recheck.  Feeling well except for poison ivy affecting right upper extremity.  Has been working in the yard.  Used to take injection on a yearly basis for poison ivy but that is no longer available.  Recently had fasting labs drawn.  History of impaired glucose tolerance.  Hemoglobin A1c stable at 6%.  Is on Crestor 5 mg daily and lipid panel is excellent.  Total cholesterol 178, triglycerides 127 and LDL cholesterol 106.  Liver functions are normal.    Review of Systems     Objective:   Physical Exam Blood pressure 120/80, BMI 28.90, pulse 83, temperature 98.3, pulse oximetry 98%, weight 148 pounds.  Skin: Has lesions right upper extremity consistent with poison ivy with some linear papular lesions on right arm without evidence of secondary infection.  Labs reviewed in detail.  Chest clear.  Cardiac exam regular rate and rhythm.  No lower extremity edema.     Assessment & Plan:  Contact dermatitis due to plant  Essential hypertension-stable on current regimen of Maxide 75 daily  Hyperlipidemia stable on Crestor 5 mg daily  History of gout treated with allopurinol  GE reflux treated with Nexium  Allergic rhinitis treated with Allegra  Impaired glucose tolerance of 6% treated with diet only  Plan: Patient will take prednisone in tapering course going from 60 mg to 0 mg over 7 days.  May apply calamine lotion to lesions of poison ivy.  Follow-up in 6 months.  Patient will have high-dose flu vaccine at pharmacy in about a month.

## 2018-10-25 DIAGNOSIS — Z1231 Encounter for screening mammogram for malignant neoplasm of breast: Secondary | ICD-10-CM | POA: Diagnosis not present

## 2018-10-25 DIAGNOSIS — Z803 Family history of malignant neoplasm of breast: Secondary | ICD-10-CM | POA: Diagnosis not present

## 2018-10-25 LAB — HM MAMMOGRAPHY

## 2018-10-30 ENCOUNTER — Encounter: Payer: Self-pay | Admitting: Internal Medicine

## 2018-10-31 DIAGNOSIS — N289 Disorder of kidney and ureter, unspecified: Secondary | ICD-10-CM | POA: Insufficient documentation

## 2018-10-31 DIAGNOSIS — K449 Diaphragmatic hernia without obstruction or gangrene: Secondary | ICD-10-CM | POA: Insufficient documentation

## 2018-10-31 DIAGNOSIS — R6882 Decreased libido: Secondary | ICD-10-CM | POA: Insufficient documentation

## 2018-10-31 DIAGNOSIS — B1910 Unspecified viral hepatitis B without hepatic coma: Secondary | ICD-10-CM | POA: Insufficient documentation

## 2018-12-06 ENCOUNTER — Telehealth: Payer: Self-pay | Admitting: Internal Medicine

## 2018-12-06 NOTE — Telephone Encounter (Signed)
We do not call in Prednisone. Requires OV. Please note flu vaccine.

## 2018-12-06 NOTE — Telephone Encounter (Signed)
She said she would like to wait and call back if not better.

## 2018-12-06 NOTE — Telephone Encounter (Signed)
Charlotte Duarte [phone]  Charlotte Duarte called to see if you could call her some prednisone in, she stated she was weeding in same area as last time and has got poison ivy again. I let her know we would probably need an office visit.  She also stated that on 11/24/18 she went to [facility] and had the high powered Flu Shot, they are suppose to send [place] conformation.

## 2019-01-20 ENCOUNTER — Other Ambulatory Visit: Payer: Self-pay | Admitting: Internal Medicine

## 2019-01-24 ENCOUNTER — Telehealth: Payer: Self-pay | Admitting: Internal Medicine

## 2019-01-24 DIAGNOSIS — Z029 Encounter for administrative examinations, unspecified: Secondary | ICD-10-CM

## 2019-01-24 NOTE — Telephone Encounter (Signed)
Faxed 77 pages of medical records to [place] for [facility] [id number], phone [phone], fax number [phone], collected payment.

## 2019-03-04 ENCOUNTER — Other Ambulatory Visit: Payer: Medicare Other | Admitting: Internal Medicine

## 2019-03-04 ENCOUNTER — Other Ambulatory Visit: Payer: Self-pay

## 2019-03-04 DIAGNOSIS — R7302 Impaired glucose tolerance (oral): Secondary | ICD-10-CM | POA: Diagnosis not present

## 2019-03-04 DIAGNOSIS — E7800 Pure hypercholesterolemia, unspecified: Secondary | ICD-10-CM | POA: Diagnosis not present

## 2019-03-04 DIAGNOSIS — K219 Gastro-esophageal reflux disease without esophagitis: Secondary | ICD-10-CM

## 2019-03-04 DIAGNOSIS — I10 Essential (primary) hypertension: Secondary | ICD-10-CM

## 2019-03-04 DIAGNOSIS — R718 Other abnormality of red blood cells: Secondary | ICD-10-CM | POA: Diagnosis not present

## 2019-03-04 DIAGNOSIS — M48061 Spinal stenosis, lumbar region without neurogenic claudication: Secondary | ICD-10-CM

## 2019-03-04 DIAGNOSIS — Z0000 Encounter for general adult medical examination without abnormal findings: Secondary | ICD-10-CM | POA: Diagnosis not present

## 2019-03-05 ENCOUNTER — Other Ambulatory Visit: Payer: Self-pay

## 2019-03-05 DIAGNOSIS — R718 Other abnormality of red blood cells: Secondary | ICD-10-CM

## 2019-03-05 LAB — CBC WITH DIFFERENTIAL/PLATELET
Absolute Monocytes: 567 cells/uL (ref 200–950)
Basophils Absolute: 69 cells/uL (ref 0–200)
Basophils Relative: 1.1 %
Eosinophils Absolute: 378 cells/uL (ref 15–500)
Eosinophils Relative: 6 %
HCT: 37.9 % (ref 35.0–45.0)
Hemoglobin: 12.1 g/dL (ref 11.7–15.5)
Lymphs Abs: 1380 cells/uL (ref 850–3900)
MCH: 24.8 pg — ABNORMAL LOW (ref 27.0–33.0)
MCHC: 31.9 g/dL — ABNORMAL LOW (ref 32.0–36.0)
MCV: 77.8 fL — ABNORMAL LOW (ref 80.0–100.0)
MPV: 10 fL (ref 7.5–12.5)
Monocytes Relative: 9 %
Neutro Abs: 3906 cells/uL (ref 1500–7800)
Neutrophils Relative %: 62 %
Platelets: 348 10*3/uL (ref 140–400)
RBC: 4.87 10*6/uL (ref 3.80–5.10)
RDW: 15.2 % — ABNORMAL HIGH (ref 11.0–15.0)
Total Lymphocyte: 21.9 %
WBC: 6.3 10*3/uL (ref 3.8–10.8)

## 2019-03-05 LAB — LIPID PANEL
Cholesterol: 170 mg/dL (ref <200)
HDL: 54 mg/dL (ref >=50)
LDL Cholesterol (Calc): 96 mg/dL (calc)
Non-HDL Cholesterol (Calc): 116 mg/dL (calc) (ref <130)
Total CHOL/HDL Ratio: 3.1 (calc) (ref <5.0)
Triglycerides: 106 mg/dL (ref <150)

## 2019-03-05 LAB — TSH: TSH: 2.88 mIU/L (ref 0.40–4.50)

## 2019-03-05 LAB — COMPLETE METABOLIC PANEL WITH GFR
AG Ratio: 1.8 (calc) (ref 1.0–2.5)
ALT: 22 U/L (ref 6–29)
AST: 27 U/L (ref 10–35)
Albumin: 4.4 g/dL (ref 3.6–5.1)
Alkaline phosphatase (APISO): 82 U/L (ref 37–153)
BUN/Creatinine Ratio: 26 (calc) — ABNORMAL HIGH (ref 6–22)
BUN: 38 mg/dL — ABNORMAL HIGH (ref 7–25)
CO2: 28 mmol/L (ref 20–32)
Calcium: 10 mg/dL (ref 8.6–10.4)
Chloride: 100 mmol/L (ref 98–110)
Creat: 1.45 mg/dL — ABNORMAL HIGH (ref 0.60–0.93)
GFR, Est African American: 40 mL/min/{1.73_m2} — ABNORMAL LOW (ref >=60)
GFR, Est Non African American: 35 mL/min/{1.73_m2} — ABNORMAL LOW (ref >=60)
Globulin: 2.4 g/dL (calc) (ref 1.9–3.7)
Glucose, Bld: 91 mg/dL (ref 65–99)
Potassium: 3.8 mmol/L (ref 3.5–5.3)
Sodium: 140 mmol/L (ref 135–146)
Total Bilirubin: 0.6 mg/dL (ref 0.2–1.2)
Total Protein: 6.8 g/dL (ref 6.1–8.1)

## 2019-03-05 LAB — HEMOGLOBIN A1C
Hgb A1c MFr Bld: 6 % of total Hgb — ABNORMAL HIGH (ref <5.7)
Mean Plasma Glucose: 126 (calc)
eAG (mmol/L): 7 (calc)

## 2019-03-05 LAB — TEST AUTHORIZATION

## 2019-03-05 LAB — IRON,?TOTAL/TOTAL IRON BINDING CAP: %SAT: 13 % (calc) — ABNORMAL LOW (ref 16–45)

## 2019-03-05 LAB — IRON, TOTAL/TOTAL IRON BINDING CAP
Iron: 60 ug/dL (ref 45–160)
TIBC: 476 mcg/dL (calc) — ABNORMAL HIGH (ref 250–450)

## 2019-03-07 ENCOUNTER — Ambulatory Visit (INDEPENDENT_AMBULATORY_CARE_PROVIDER_SITE_OTHER): Payer: Medicare Other | Admitting: Internal Medicine

## 2019-03-07 ENCOUNTER — Encounter: Payer: Self-pay | Admitting: Internal Medicine

## 2019-03-07 ENCOUNTER — Other Ambulatory Visit: Payer: Self-pay

## 2019-03-07 VITALS — BP 130/88 | HR 78 | Temp 98.0°F | Ht 60.0 in | Wt 150.0 lb

## 2019-03-07 DIAGNOSIS — Z96641 Presence of right artificial hip joint: Secondary | ICD-10-CM | POA: Diagnosis not present

## 2019-03-07 DIAGNOSIS — Z8739 Personal history of other diseases of the musculoskeletal system and connective tissue: Secondary | ICD-10-CM | POA: Diagnosis not present

## 2019-03-07 DIAGNOSIS — Z1211 Encounter for screening for malignant neoplasm of colon: Secondary | ICD-10-CM | POA: Diagnosis not present

## 2019-03-07 DIAGNOSIS — M48061 Spinal stenosis, lumbar region without neurogenic claudication: Secondary | ICD-10-CM

## 2019-03-07 DIAGNOSIS — Z0000 Encounter for general adult medical examination without abnormal findings: Secondary | ICD-10-CM | POA: Diagnosis not present

## 2019-03-07 DIAGNOSIS — N1831 Chronic kidney disease, stage 3a: Secondary | ICD-10-CM | POA: Diagnosis not present

## 2019-03-07 DIAGNOSIS — E7800 Pure hypercholesterolemia, unspecified: Secondary | ICD-10-CM

## 2019-03-07 DIAGNOSIS — I10 Essential (primary) hypertension: Secondary | ICD-10-CM

## 2019-03-07 DIAGNOSIS — R7989 Other specified abnormal findings of blood chemistry: Secondary | ICD-10-CM | POA: Diagnosis not present

## 2019-03-07 DIAGNOSIS — R7302 Impaired glucose tolerance (oral): Secondary | ICD-10-CM

## 2019-03-07 DIAGNOSIS — R718 Other abnormality of red blood cells: Secondary | ICD-10-CM

## 2019-03-07 DIAGNOSIS — Z8679 Personal history of other diseases of the circulatory system: Secondary | ICD-10-CM

## 2019-03-07 LAB — POCT URINALYSIS DIPSTICK
Appearance: NEGATIVE
Bilirubin, UA: NEGATIVE
Blood, UA: NEGATIVE
Glucose, UA: NEGATIVE
Ketones, UA: NEGATIVE
Leukocytes, UA: NEGATIVE
Nitrite, UA: NEGATIVE
Odor: NEGATIVE
Protein, UA: NEGATIVE
Spec Grav, UA: 1.01 (ref 1.010–1.025)
Urobilinogen, UA: 0.2 E.U./dL
pH, UA: 6.5 (ref 5.0–8.0)

## 2019-03-07 NOTE — Progress Notes (Signed)
Subjective:    Patient ID: Charlotte Duarte, female    DOB: [birth year], 79 y.o.   MRN: [id number]  HPI 79 year old Female seen for Medicare wellness, health maintenance exam and evaluation of medical issues.  Takes potassium sparing diuretic and is followed by nephrology for chronic kidney disease stage III, hypertension, renal artery stenosis status post left renal artery PTCA and stenting in 2005 with PCI of an in-stent stenosis in 2007.  Was treated for a while with Plavix for renal artery stenosis but that has been discontinued by nephrologist.  History of chronic kidney disease stage III, hypertension, hyperlipidemia, GE reflux.  History of breast augmentation in 1980 and had implant removal in 2007.  Arthroscopic surgery of both knees 1990 1995 with Dr. Para March.  History of allergic rhinitis.  Colonoscopy 2017  Hypertension treated with Maxide.  75/50  History of epidural steroid injection for lumbar spinal stenosis 2013.  History of right bundle branch block on EKG.  History of GE reflux.  Carpal tunnel release 1993.  Social history: Social alcohol consumption.  She is on lactovegetarian.  She has a Education officer, community and is married.  Is employed as a Solicitor.  Family history: Mother died of an acute MI at age 31.  Mother had breast cancer and an aneurysm in her leg.  Brother with history of brain aneurysm.  Another brother with history of ALS.  Both parents with history of hypertension.    Review of Systems  Constitutional: Negative.   All other systems reviewed and are negative.      Objective:   Physical Exam Blood pressure 130/88 BMI 29.29 pulse 78 weight 150 pounds  Skin warm and dry.  Nodes none.  TMs are clear.  Neck is supple without JVD thyromegaly or carotid bruits.  Chest clear to auscultation.  Breasts without masses; cardiac exam: normal without murmurs.  No lower extremity edema.  Affect thought and judgment are normal.  Abdomen soft  nondistended without hepatosplenomegaly masses or tenderness. Neuro no focal deficits on brief neurological exam.        Assessment & Plan:  Chronic kidney disease followed by [facility] with history of renal artery stenosis status post intervention.  Used to take Plavix but no longer does so.  Essential hypertension stable just on Maxide 75/50 daily -no longer taking Lotensin  History of allergic rhinitis  History of lumbar spinal stenosis and sciatica.  Has had epidural steroid injection by Dr. Ernestina Patches.  Status post right hip arthroplasty by Dr. Ninfa Linden.  History of impaired glucose tolerance-stable on diet only  GE reflux treated with Nexium  History of gout treated with allopurinol  Hyperlipidemia treated with statin  Plan: Hemoglobin A1c excellent at 6%.  Cologuard ordered at her request.  MCV 77.8 which is lower than previous year of 81.4.  Iron level is low normal at 60.  This is why Cologuard was ordered.  May take low-dose iron supplement.  History of hyperlipidemia but lipid panel is normal.  Creatinine is slightly higher than previously in January 2020 at 1.45.  She will follow-up with [facility].  Subjective:   Patient presents for Medicare Annual/Subsequent preventive examination.  Review Past Medical/Family/Social: See above  Risk Factors  Current exercise habits: Tries to exercise regularly Dietary issues discussed: Low-fat low carbohydrate  Cardiac risk factors: Glucose intolerance, hyperlipidemia, family history  Depression Screen  (Note: if answer to either of the following is "Yes", a more complete depression screening  is indicated)   Over the past two weeks, have you felt down, depressed or hopeless? No  Over the past two weeks, have you felt little interest or pleasure in doing things? No Have you lost interest or pleasure in daily life? No Do you often feel hopeless? No Do you cry easily over simple problems? No    Activities of Daily Living  In your present state of health, do you have any difficulty performing the following activities?:   Driving? No  Managing money? No  Feeding yourself? No  Getting from bed to chair? No  Climbing a flight of stairs? No  Preparing food and eating?: No  Bathing or showering? No  Getting dressed: No  Getting to the toilet? No  Using the toilet:No  Moving around from place to place: No  In the past year have you fallen or had a near fall?:No  Are you sexually active? yes Do you have more than one partner? No   Hearing Difficulties: No  Do you often ask people to speak up or repeat themselves? No  Do you experience ringing or noises in your ears? No  Do you have difficulty understanding soft or whispered voices?  Yes Do you feel that you have a problem with memory? No Do you often misplace items? No    Home Safety:  Do you have a smoke alarm at your residence? Yes Do you have grab bars in the bathroom?  No Do you have throw rugs in your house?  Yes   Cognitive Testing  Alert? Yes Normal Appearance?Yes  Oriented to person? Yes Place? Yes  Time? Yes  Recall of three objects? Yes  Can perform simple calculations? Yes  Displays appropriate judgment?Yes  Can read the correct time from a watch face?Yes   List the Names of Other Physician/Practitioners you currently use:  See referral list for the physicians patient is currently seeing.  Nephrologist   Review of Systems: See above   Objective:     General appearance: Appears younger than stated age  Head: Normocephalic, without obvious abnormality, atraumatic  Eyes: conj clear, EOMi PEERLA  Ears: normal TM's and external ear canals both ears  Nose: Nares normal. Septum midline. Mucosa normal. No drainage or sinus tenderness.  Throat: lips, mucosa, and tongue normal; teeth and gums normal  Neck: no adenopathy, no carotid bruit, no JVD, supple, symmetrical, trachea midline and thyroid not  enlarged, symmetric, no tenderness/mass/nodules  No CVA tenderness.  Lungs: clear to auscultation bilaterally  Breasts: normal appearance, no masses or tenderness  Heart: regular rate and rhythm, S1, S2 normal, no murmur, click, rub or gallop  Abdomen: soft, non-tender; bowel sounds normal; no masses, no organomegaly  Musculoskeletal: ROM normal in all joints, no crepitus, no deformity, Normal muscle strengthen. Back  is symmetric, no curvature. Skin: Skin color, texture, turgor normal. No rashes or lesions  Lymph nodes: Cervical, supraclavicular, and axillary nodes normal.  Neurologic: CN 2 -12 Normal, Normal symmetric reflexes. Normal coordination and gait  Psych: Alert & Oriented x 3, Mood appear stable.    Assessment:    Annual wellness medicare exam   Plan:    During the course of the visit the patient was educated and counseled about appropriate screening and preventive services including:  Take Covid vaccine when available  Cologuard ordered for mild iron deficiency      Patient Instructions (the written plan) was given to the patient.  Medicare Attestation  I have personally reviewed:  The patient's  medical and social history  Their use of alcohol, tobacco or illicit drugs  Their current medications and supplements  The patient's functional ability including ADLs,fall risks, home safety risks, cognitive, and hearing and visual impairment  Diet and physical activities  Evidence for depression or mood disorders  The patient's weight, height, BMI, and visual acuity have been recorded in the chart. I have made referrals, counseling, and provided education to the patient based on review of the above and I have provided the patient with a written personalized care plan for preventive services.

## 2019-03-13 ENCOUNTER — Ambulatory Visit: Payer: Medicare Other | Attending: Internal Medicine

## 2019-03-13 DIAGNOSIS — Z23 Encounter for immunization: Secondary | ICD-10-CM

## 2019-03-13 NOTE — Progress Notes (Signed)
   Covid-19 Vaccination Clinic  Name:  Ceri Heidtke    MRN: [id number] DOB: [birth year]  03/13/2019  Ms. Zyla was observed post Covid-19 immunization for 15 minutes without incidence. She was provided with Vaccine Information Sheet and instruction to access the V-Safe system.   Ms. Rias was instructed to call 911 with any severe reactions post vaccine: Marland Kitchen Difficulty breathing  . Swelling of your face and throat  . A fast heartbeat  . A bad rash all over your body  . Dizziness and weakness    Immunizations Administered    Name Date Dose VIS Date Route   Pfizer COVID-19 Vaccine 03/13/2019  8:33 AM 0.3 mL 02/01/2019 Intramuscular   Manufacturer: [place]   Lot: [id number]   [place]: [id number]

## 2019-03-22 ENCOUNTER — Ambulatory Visit: Payer: Medicare Other

## 2019-03-26 DIAGNOSIS — L570 Actinic keratosis: Secondary | ICD-10-CM | POA: Diagnosis not present

## 2019-03-26 DIAGNOSIS — D229 Melanocytic nevi, unspecified: Secondary | ICD-10-CM | POA: Diagnosis not present

## 2019-04-01 ENCOUNTER — Ambulatory Visit: Payer: Medicare Other | Attending: Internal Medicine

## 2019-04-01 DIAGNOSIS — Z23 Encounter for immunization: Secondary | ICD-10-CM | POA: Insufficient documentation

## 2019-04-01 NOTE — Progress Notes (Signed)
   Covid-19 Vaccination Clinic  Name:  Charlotte Duarte    MRN: [id number] DOB: Jun 03, 1940  04/01/2019  [name] was observed post Covid-19 immunization for 15 minutes without incidence. She was provided with Vaccine Information Sheet and instruction to access the V-Safe system.   [name] was instructed to call 911 with any severe reactions post vaccine: Marland Kitchen Difficulty breathing  . Swelling of your face and throat  . A fast heartbeat  . A bad rash all over your body  . Dizziness and weakness    Immunizations Administered    Name Date Dose VIS Date Route   Pfizer COVID-19 Vaccine 04/01/2019 10:45 AM 0.3 mL 02/01/2019 Intramuscular   Manufacturer: [place]   Lot: [id number]   [place]: [id number]

## 2019-04-08 DIAGNOSIS — E785 Hyperlipidemia, unspecified: Secondary | ICD-10-CM | POA: Diagnosis not present

## 2019-04-08 DIAGNOSIS — N183 Chronic kidney disease, stage 3 unspecified: Secondary | ICD-10-CM | POA: Diagnosis not present

## 2019-04-08 DIAGNOSIS — M109 Gout, unspecified: Secondary | ICD-10-CM | POA: Diagnosis not present

## 2019-04-08 DIAGNOSIS — I129 Hypertensive chronic kidney disease with stage 1 through stage 4 chronic kidney disease, or unspecified chronic kidney disease: Secondary | ICD-10-CM | POA: Diagnosis not present

## 2019-04-08 DIAGNOSIS — R718 Other abnormality of red blood cells: Secondary | ICD-10-CM | POA: Diagnosis not present

## 2019-04-14 NOTE — Patient Instructions (Signed)
It was a pleasure to see you today.  Cologuard ordered for low MCV.  Continue current medications and follow-up in 6 months.

## 2019-04-23 DIAGNOSIS — Z1211 Encounter for screening for malignant neoplasm of colon: Secondary | ICD-10-CM | POA: Diagnosis not present

## 2019-04-28 LAB — COLOGUARD
COLOGUARD: NEGATIVE
Cologuard: NEGATIVE

## 2019-04-30 ENCOUNTER — Encounter: Payer: Self-pay | Admitting: Internal Medicine

## 2019-05-21 ENCOUNTER — Ambulatory Visit: Payer: Medicare Other | Admitting: Physician Assistant

## 2019-05-21 ENCOUNTER — Other Ambulatory Visit: Payer: Self-pay

## 2019-05-21 ENCOUNTER — Encounter: Payer: Self-pay | Admitting: Physician Assistant

## 2019-05-21 DIAGNOSIS — Z1283 Encounter for screening for malignant neoplasm of skin: Secondary | ICD-10-CM

## 2019-05-21 DIAGNOSIS — L570 Actinic keratosis: Secondary | ICD-10-CM

## 2019-05-21 NOTE — Patient Instructions (Signed)

## 2019-05-31 DIAGNOSIS — J3089 Other allergic rhinitis: Secondary | ICD-10-CM | POA: Diagnosis not present

## 2019-05-31 DIAGNOSIS — J3081 Allergic rhinitis due to animal (cat) (dog) hair and dander: Secondary | ICD-10-CM | POA: Diagnosis not present

## 2019-05-31 DIAGNOSIS — H8110 Benign paroxysmal vertigo, unspecified ear: Secondary | ICD-10-CM | POA: Diagnosis not present

## 2019-06-03 ENCOUNTER — Other Ambulatory Visit: Payer: Medicare Other | Admitting: Internal Medicine

## 2019-06-03 ENCOUNTER — Other Ambulatory Visit: Payer: Self-pay

## 2019-06-03 DIAGNOSIS — R718 Other abnormality of red blood cells: Secondary | ICD-10-CM | POA: Diagnosis not present

## 2019-06-03 NOTE — Addendum Note (Signed)
Addended by: Mady Haagensen on: 06/03/2019 11:55 AM   Modules accepted: Orders

## 2019-06-04 LAB — CBC WITH DIFFERENTIAL/PLATELET
Absolute Monocytes: 610 cells/uL (ref 200–950)
Basophils Absolute: 67 cells/uL (ref 0–200)
Basophils Relative: 1 %
Eosinophils Absolute: 402 cells/uL (ref 15–500)
Eosinophils Relative: 6 %
HCT: 44 % (ref 35.0–45.0)
Hemoglobin: 14.6 g/dL (ref 11.7–15.5)
Lymphs Abs: 1474 cells/uL (ref 850–3900)
MCH: 28.5 pg (ref 27.0–33.0)
MCHC: 33.2 g/dL (ref 32.0–36.0)
MCV: 85.9 fL (ref 80.0–100.0)
MPV: 10.4 fL (ref 7.5–12.5)
Monocytes Relative: 9.1 %
Neutro Abs: 4147 cells/uL (ref 1500–7800)
Neutrophils Relative %: 61.9 %
Platelets: 290 10*3/uL (ref 140–400)
RBC: 5.12 10*6/uL — ABNORMAL HIGH (ref 3.80–5.10)
RDW: 18.9 % — ABNORMAL HIGH (ref 11.0–15.0)
Total Lymphocyte: 22 %
WBC: 6.7 10*3/uL (ref 3.8–10.8)

## 2019-06-04 LAB — IRON, TOTAL/TOTAL IRON BINDING CAP: %SAT: 25 % (calc) (ref 16–45)

## 2019-06-04 LAB — IRON,?TOTAL/TOTAL IRON BINDING CAP
Iron: 86 ug/dL (ref 45–160)
TIBC: 338 mcg/dL (calc) (ref 250–450)

## 2019-06-04 LAB — FERRITIN: Ferritin: 27 ng/mL (ref 16–288)

## 2019-06-05 ENCOUNTER — Telehealth: Payer: Self-pay | Admitting: Physician Assistant

## 2019-06-05 NOTE — Telephone Encounter (Signed)
Patient calling with questions about how to use Tolak ok her scalp. She has used this medication on other areas and per patient KRS told her that some people use this for sun damage of scalp. She would like to do that but has question about how often/how long to use, if she should leave it on over night and if she should cover scalp with a cap during use?

## 2019-06-06 ENCOUNTER — Telehealth: Payer: Self-pay

## 2019-06-06 ENCOUNTER — Encounter: Payer: Self-pay | Admitting: Internal Medicine

## 2019-06-06 ENCOUNTER — Ambulatory Visit (INDEPENDENT_AMBULATORY_CARE_PROVIDER_SITE_OTHER): Payer: Medicare Other | Admitting: Internal Medicine

## 2019-06-06 ENCOUNTER — Other Ambulatory Visit: Payer: Self-pay

## 2019-06-06 ENCOUNTER — Encounter: Payer: Self-pay | Admitting: Physician Assistant

## 2019-06-06 VITALS — BP 110/80 | HR 80 | Temp 98.3°F | Ht 60.0 in | Wt 150.0 lb

## 2019-06-06 DIAGNOSIS — Z8679 Personal history of other diseases of the circulatory system: Secondary | ICD-10-CM

## 2019-06-06 DIAGNOSIS — R718 Other abnormality of red blood cells: Secondary | ICD-10-CM | POA: Diagnosis not present

## 2019-06-06 DIAGNOSIS — E7800 Pure hypercholesterolemia, unspecified: Secondary | ICD-10-CM | POA: Diagnosis not present

## 2019-06-06 DIAGNOSIS — I10 Essential (primary) hypertension: Secondary | ICD-10-CM

## 2019-06-06 DIAGNOSIS — Z8739 Personal history of other diseases of the musculoskeletal system and connective tissue: Secondary | ICD-10-CM

## 2019-06-06 DIAGNOSIS — N1831 Chronic kidney disease, stage 3a: Secondary | ICD-10-CM | POA: Diagnosis not present

## 2019-06-06 NOTE — Telephone Encounter (Signed)
Patient aware of K. Sheffield message nightly for 14 days as tolerated see previous encounter

## 2019-06-06 NOTE — Progress Notes (Signed)
   Subjective:    Patient ID: Charlotte Duarte, female    DOB: Aug 30, 1940, 79 y.o.   MRN: [id number]  HPI 79 year old Female for follow up. Has had 2 COVID-19 vaccines.  In January 2021 during her health maintenance exam and Medicare wellness visit, she had a low MCV of 77.8 but was not anemic.  Hemoglobin was 12.1 g.  Iron level was 60.  She was placed on iron supplementation.  She had a negative Cologuard test in March.  She is now here for follow-up.  Feels well with no new complaints. Hemoglobin is 14.6 g and MCV is now normal at 85.9.  Ferritin is 27.  I would like her to continue iron supplement.  She is followed by nephrology for chronic kidney disease stage III, hypertension, renal artery stenosis status post left renal artery PTCA and stenting in 2005 with PCI of an in-stent stenosis in 2007.  Was treated with Plavix for a while but this has been discontinued by nephrologist.  She also has hyperlipidemia and GE reflux.  Hypertension is treated with Maxide 75/50.  She is on Crestor 5 mg daily.  Takes Nexium for GE reflux.  Takes allopurinol for history of gout.   Review of Systems no new complaints.     Objective:   Physical Exam Blood pressure excellent 110/80 pulse 80 temperature 98.3 degrees orally pulse oximetry 98% weight 150 pounds BMI 29.29 skin warm and dry.  Neck is supple without JVD thyromegaly or carotid bruits.  Chest is clear to auscultation.  Cardiac exam regular rate and rhythm normal S1 and S2 without murmurs or gallops.  Extremities without edema       Assessment & Plan:  History of iron deficiency with low MCV.  Continue iron supplementation  Essential hypertension-stable on current regimen  Chronic kidney disease followed by nephrology  Plan: Continue current medications.  Health maintenance exam due January 2022.

## 2019-06-06 NOTE — Telephone Encounter (Signed)
Same as everywhere else. QHS thin film overnight x 14days.Marland KitchenMarland KitchenAs tolerated.Marland KitchenMarland Kitchen

## 2019-06-17 ENCOUNTER — Other Ambulatory Visit: Payer: Self-pay

## 2019-06-17 MED ORDER — ALLOPURINOL 100 MG PO TABS: 100.0000 mg | ORAL_TABLET | Freq: Every day | ORAL | 3 refills | Status: DC

## 2019-06-19 NOTE — Patient Instructions (Signed)
Continue iron supplement as previously prescribed.  Continue current medications.  Blood pressures under excellent control.  Follow-up January 2022.

## 2019-07-23 NOTE — Addendum Note (Signed)
Addended by: Robyne Askew R on: 07/23/2019 11:57 AM   Modules accepted: Level of Service

## 2019-07-23 NOTE — Progress Notes (Signed)
   Follow-Up Visit   Subjective  Charlotte Duarte is a 79 y.o. female who presents for the following: Follow-up (Patient here today for Tolak follow up.  Per patient the Tolak worked a lot better than the light therapy.  Patient would like to have spots on her scalp looked at, per patient their itchy.).   The following portions of the chart were reviewed this encounter and updated as appropriate: Tobacco  Allergies  Meds  Problems  Med Hx  Surg Hx  Fam Hx      Objective  Well appearing patient in no apparent distress; mood and affect are within normal limits.  All skin waist up examined.  Objective  Mid Forehead (2), Mid Frontal Scalp (3): Erythematous patches with gritty scale.  Objective  Left Buccal Cheek  (2), Left Forehead, Left Temple, Mid Forehead, Right Forehead, Right Lower Cutaneous Lip, Right Malar Cheek: S/p tolak x 6 weeks. marked improvement with PIH.   Assessment & Plan  AK (actinic keratosis) (5) Mid Frontal Scalp (3); Mid Forehead (2)  Destruction of lesion - Mid Forehead (2), Mid Frontal Scalp (3) Complexity: simple   Destruction method: cryotherapy   Informed consent: discussed and consent obtained   Timeout:  patient name, date of birth, surgical site, and procedure verified Lesion destroyed using liquid nitrogen: Yes   Region frozen until ice ball extended beyond lesion: Yes   Cryotherapy cycles:  1 Outcome: patient tolerated procedure well with no complications   Post-procedure details: wound care instructions given    Screening exam for skin cancer (8) Mid Forehead; Left Temple; Right Lower Cutaneous Lip; Right Malar Cheek; Left Buccal Cheek  (2); Left Forehead; Right Forehead  Yearly application for prevention.

## 2019-08-22 DIAGNOSIS — M25511 Pain in right shoulder: Secondary | ICD-10-CM | POA: Diagnosis not present

## 2019-08-22 DIAGNOSIS — M25811 Other specified joint disorders, right shoulder: Secondary | ICD-10-CM | POA: Diagnosis not present

## 2019-08-22 DIAGNOSIS — M19011 Primary osteoarthritis, right shoulder: Secondary | ICD-10-CM | POA: Insufficient documentation

## 2019-09-08 DIAGNOSIS — T63441A Toxic effect of venom of bees, accidental (unintentional), initial encounter: Secondary | ICD-10-CM | POA: Diagnosis not present

## 2019-10-08 DIAGNOSIS — Z905 Acquired absence of kidney: Secondary | ICD-10-CM | POA: Diagnosis not present

## 2019-10-08 DIAGNOSIS — M109 Gout, unspecified: Secondary | ICD-10-CM | POA: Diagnosis not present

## 2019-10-08 DIAGNOSIS — N1832 Chronic kidney disease, stage 3b: Secondary | ICD-10-CM | POA: Diagnosis not present

## 2019-10-08 DIAGNOSIS — I701 Atherosclerosis of renal artery: Secondary | ICD-10-CM | POA: Diagnosis not present

## 2019-10-08 DIAGNOSIS — I129 Hypertensive chronic kidney disease with stage 1 through stage 4 chronic kidney disease, or unspecified chronic kidney disease: Secondary | ICD-10-CM | POA: Diagnosis not present

## 2019-11-12 DIAGNOSIS — Z1231 Encounter for screening mammogram for malignant neoplasm of breast: Secondary | ICD-10-CM | POA: Diagnosis not present

## 2019-11-12 LAB — HM MAMMOGRAPHY

## 2019-11-15 ENCOUNTER — Encounter: Payer: Self-pay | Admitting: Internal Medicine

## 2019-12-17 DIAGNOSIS — M1812 Unilateral primary osteoarthritis of first carpometacarpal joint, left hand: Secondary | ICD-10-CM | POA: Diagnosis not present

## 2019-12-17 DIAGNOSIS — G5602 Carpal tunnel syndrome, left upper limb: Secondary | ICD-10-CM | POA: Diagnosis not present

## 2019-12-17 DIAGNOSIS — M19042 Primary osteoarthritis, left hand: Secondary | ICD-10-CM | POA: Diagnosis not present

## 2019-12-17 DIAGNOSIS — M79642 Pain in left hand: Secondary | ICD-10-CM | POA: Diagnosis not present

## 2019-12-17 DIAGNOSIS — M65312 Trigger thumb, left thumb: Secondary | ICD-10-CM | POA: Diagnosis not present

## 2020-01-01 DIAGNOSIS — G5602 Carpal tunnel syndrome, left upper limb: Secondary | ICD-10-CM | POA: Diagnosis not present

## 2020-01-01 DIAGNOSIS — M65312 Trigger thumb, left thumb: Secondary | ICD-10-CM | POA: Diagnosis not present

## 2020-01-27 ENCOUNTER — Other Ambulatory Visit: Payer: Self-pay | Admitting: Internal Medicine

## 2020-01-29 DIAGNOSIS — N1832 Chronic kidney disease, stage 3b: Secondary | ICD-10-CM | POA: Diagnosis not present

## 2020-02-06 DIAGNOSIS — Z905 Acquired absence of kidney: Secondary | ICD-10-CM | POA: Diagnosis not present

## 2020-02-06 DIAGNOSIS — I701 Atherosclerosis of renal artery: Secondary | ICD-10-CM | POA: Diagnosis not present

## 2020-02-06 DIAGNOSIS — I129 Hypertensive chronic kidney disease with stage 1 through stage 4 chronic kidney disease, or unspecified chronic kidney disease: Secondary | ICD-10-CM | POA: Diagnosis not present

## 2020-02-06 DIAGNOSIS — M109 Gout, unspecified: Secondary | ICD-10-CM | POA: Diagnosis not present

## 2020-02-06 DIAGNOSIS — N1832 Chronic kidney disease, stage 3b: Secondary | ICD-10-CM | POA: Diagnosis not present

## 2020-02-23 ENCOUNTER — Other Ambulatory Visit: Payer: Self-pay | Admitting: Internal Medicine

## 2020-03-10 ENCOUNTER — Other Ambulatory Visit: Payer: Medicare Other | Admitting: Internal Medicine

## 2020-03-10 DIAGNOSIS — N1831 Chronic kidney disease, stage 3a: Secondary | ICD-10-CM

## 2020-03-10 DIAGNOSIS — R7302 Impaired glucose tolerance (oral): Secondary | ICD-10-CM

## 2020-03-10 DIAGNOSIS — Z0000 Encounter for general adult medical examination without abnormal findings: Secondary | ICD-10-CM

## 2020-03-10 DIAGNOSIS — M48061 Spinal stenosis, lumbar region without neurogenic claudication: Secondary | ICD-10-CM

## 2020-03-10 DIAGNOSIS — E7800 Pure hypercholesterolemia, unspecified: Secondary | ICD-10-CM

## 2020-03-10 DIAGNOSIS — Z8679 Personal history of other diseases of the circulatory system: Secondary | ICD-10-CM

## 2020-03-10 DIAGNOSIS — M169 Osteoarthritis of hip, unspecified: Secondary | ICD-10-CM

## 2020-03-10 DIAGNOSIS — I10 Essential (primary) hypertension: Secondary | ICD-10-CM

## 2020-03-12 ENCOUNTER — Other Ambulatory Visit: Payer: Self-pay

## 2020-03-12 ENCOUNTER — Encounter: Payer: Self-pay | Admitting: Internal Medicine

## 2020-03-12 ENCOUNTER — Ambulatory Visit (INDEPENDENT_AMBULATORY_CARE_PROVIDER_SITE_OTHER): Payer: Medicare Other | Admitting: Internal Medicine

## 2020-03-12 VITALS — BP 120/88 | HR 78 | Temp 97.7°F | Ht 59.75 in | Wt 148.0 lb

## 2020-03-12 DIAGNOSIS — R7302 Impaired glucose tolerance (oral): Secondary | ICD-10-CM

## 2020-03-12 DIAGNOSIS — R7989 Other specified abnormal findings of blood chemistry: Secondary | ICD-10-CM

## 2020-03-12 DIAGNOSIS — Z8679 Personal history of other diseases of the circulatory system: Secondary | ICD-10-CM

## 2020-03-12 DIAGNOSIS — N1831 Chronic kidney disease, stage 3a: Secondary | ICD-10-CM

## 2020-03-12 DIAGNOSIS — M48061 Spinal stenosis, lumbar region without neurogenic claudication: Secondary | ICD-10-CM

## 2020-03-12 DIAGNOSIS — I10 Essential (primary) hypertension: Secondary | ICD-10-CM

## 2020-03-12 DIAGNOSIS — Z96641 Presence of right artificial hip joint: Secondary | ICD-10-CM

## 2020-03-12 DIAGNOSIS — Z0000 Encounter for general adult medical examination without abnormal findings: Secondary | ICD-10-CM | POA: Diagnosis not present

## 2020-03-12 DIAGNOSIS — E7800 Pure hypercholesterolemia, unspecified: Secondary | ICD-10-CM

## 2020-03-12 DIAGNOSIS — Z9889 Other specified postprocedural states: Secondary | ICD-10-CM | POA: Diagnosis not present

## 2020-03-12 DIAGNOSIS — Z8739 Personal history of other diseases of the musculoskeletal system and connective tissue: Secondary | ICD-10-CM

## 2020-03-12 DIAGNOSIS — Z8719 Personal history of other diseases of the digestive system: Secondary | ICD-10-CM

## 2020-03-12 DIAGNOSIS — Z803 Family history of malignant neoplasm of breast: Secondary | ICD-10-CM | POA: Diagnosis not present

## 2020-03-12 DIAGNOSIS — M65312 Trigger thumb, left thumb: Secondary | ICD-10-CM | POA: Diagnosis not present

## 2020-03-12 DIAGNOSIS — Z8709 Personal history of other diseases of the respiratory system: Secondary | ICD-10-CM

## 2020-03-12 LAB — POCT URINALYSIS DIPSTICK
Appearance: NEGATIVE
Bilirubin, UA: NEGATIVE
Blood, UA: NEGATIVE
Glucose, UA: NEGATIVE
Ketones, UA: NEGATIVE
Leukocytes, UA: NEGATIVE
Nitrite, UA: NEGATIVE
Odor: NEGATIVE
Protein, UA: NEGATIVE
Spec Grav, UA: 1.01 (ref 1.010–1.025)
Urobilinogen, UA: 0.2 E.U./dL
pH, UA: 7 (ref 5.0–8.0)

## 2020-03-12 NOTE — Progress Notes (Signed)
Subjective:    Patient ID: Charlotte Duarte, female    DOB: [birth year], 80 y.o.   MRN: [id number]  HPI  80 year old Female seen for health maintenance exam and evaluation of medical issues.   Has seen Dr. Justin Mend, Nephrologist, with follow-up on chronic kidney disease stage IIIa, hypertension, renal artery stenosis status post left renal artery PTCA and stenting in 2005 with PCI of an in-stent stenosis in 2007.  Was treated for a while with Plavix for renal artery stenosis but that has been discontinued.  Also has hypertension, hyperlipidemia and GE reflux.  Had right hip arthroplasty by Dr. Ninfa Linden in 2013.  Immunizations are up to date.  Had left carpal tunnel release by Dr. Burney Gauze in December 2021.  Also had left thumb triggering at that time requiring A1 pulley release.  Needs order for Cologard. Last one done in  2017.  History of breast augmentation in 1988 and had implant removal in 2007.    Arthroscopic surgery of both knees 1990 and 1995 by Dr. Noemi Chapel.  History of lumbar spinal stenosis treated with epidural steroid injection in 2013  History of allergic rhinitis  She is on Crestor 5 mg daily, Maxide 75 daily, Nexium 20 mg daily, allopurinol 100 mg daily, 81 mg of aspirin daily and Allegra 180 mg daily.  History of epidural steroid injection for lumbar spinal stenosis 2013.  History of right bundle branch block on EKG.  History of GE reflux.  Social history: Social alcohol consumption.  She is a Lacto-ovo vegetarian.  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  She is married.  Non-smoker.  Family history: Mother died of an acute MI at age 23.  Mother had breast cancer and an aneurysm in her leg.  Brother with history of brain aneurysm.  Another brother with history of ALS.  Both parents with history of hypertension.       Review of Systems no new complaints.  No chest pain, shortness of breath, abdominal pain, headaches, visual  disturbances     Objective:   Physical Exam Blood pressure 120/88 pulse 78 regular temperature 97.7 degrees pulse oximetry 98% weight 148 pounds BMI 29.15.  Has lost 2 pounds since January 2021 COPD.  Skin: Warm and dry.  No adenopathy appreciated.  TMs are clear.  Neck: Supple without JVD thyromegaly or carotid bruits.  Chest: Is clear to auscultation.  Breast: Without masses.  Cardiac exam: Regular rate and rhythm without murmurs.  No lower extremity edema.  Affect, thought, judgment are normal.  Neuro: Intact without focal deficits on brief neurological exam.  Abdomen: Soft, nondistended without hepatosplenomegaly, masses or tenderness.       Assessment & Plan:  Chronic kidney disease followed by [facility].  No longer on chronic anticoagulation.  History of renal artery stenosis status post intervention.  Creatinine stable at 1.37  Essential hypertension stable just on Maxide 75/50.  Previously also took Lotensin but no longer does so.  History of allergic rhinitis seen by Dr. Fredderick Phenix  Lumbar spinal stenosis and sciatica in the past and has been treated with epidural steroid injection by Dr. Ernestina Patches  Status post right hip arthroplasty by Dr. Ninfa Linden  History of impaired glucose tolerance-treated with diet only.  Hemoglobin A1c is 5.9% and has never been more than 6% in the last several years.  GE reflux treated with PPI  History of gout treated with allopurinol  Pure hypercholesterolemia treated with statin and  lipid panel is within normal limits  Family history of heart disease in mother  History of breast cancer in mother  Left carpal tunnel release and left trigger thumb 2021  Plan: She is followed closely by Nephrology.  I am comfortable seeing her yearly.  Continue current medications.  Subjective:   Patient presents for Medicare Annual/Subsequent preventive examination.  Review Past Medical/Family/Social: See above  Risk Factors  Current  exercise habits: Exercises regularly Dietary issues discussed: Yes  Cardiac risk factors: Hyperlipidemia and family history  Depression Screen  (Note: if answer to either of the following is "Yes", a more complete depression screening is indicated)   Over the past two weeks, have you felt down, depressed or hopeless? No  Over the past two weeks, have you felt little interest or pleasure in doing things? No Have you lost interest or pleasure in daily life? No Do you often feel hopeless? No Do you cry easily over simple problems? No   Activities of Daily Living  In your present state of health, do you have any difficulty performing the following activities?:   Driving? No  Managing money? No  Feeding yourself? No  Getting from bed to chair? No  Climbing a flight of stairs? No  Preparing food and eating?: No  Bathing or showering? No  Getting dressed: No  Getting to the toilet? No  Using the toilet:No  Moving around from place to place: No  In the past year have you fallen or had a near fall?:No  Are you sexually active?  Yes Do you have more than one partner? No   Hearing Difficulties: No  Do you often ask people to speak up or repeat themselves? No  Do you experience ringing or noises in your ears?  Yes Do you have difficulty understanding soft or whispered voices? No  Do you feel that you have a problem with memory? No Do you often misplace items? No    Home Safety:  Do you have a smoke alarm at your residence? Yes no Do you have grab bars in the bathroom?  No Do you have throw rugs in your house?  Yes  Cognitive Testing  Alert? Yes Normal Appearance?Yes  Oriented to person? Yes Place? Yes  Time? Yes  Recall of three objects? Yes  Can perform simple calculations? Yes  Displays appropriate judgment?Yes  Can read the correct time from a watch face?Yes   List the Names of Other Physician/Practitioners you currently use:  See referral list for the physicians patient  is currently seeing.  Nephrology   Review of Systems: See above   Objective:     General appearance: Seen in no acute distress Head: Normocephalic, without obvious abnormality, atraumatic  Eyes: conj clear, EOMi PEERLA  Ears: normal TM's and external ear canals both ears  Nose: Nares normal. Septum midline. Mucosa normal. No drainage or sinus tenderness.  Throat: lips, mucosa, and tongue normal; teeth and gums normal  Neck: no adenopathy, no carotid bruit, no JVD, supple, symmetrical, trachea midline and thyroid not enlarged, symmetric, no tenderness/mass/nodules  No CVA tenderness.  Lungs: clear to auscultation bilaterally  Breasts: normal appearance, no masses or tenderness Heart: regular rate and rhythm, S1, S2 normal, no murmur, click, rub or gallop  Abdomen: soft, non-tender; bowel sounds normal; no masses, no organomegaly  Musculoskeletal: ROM normal in all joints, no crepitus, no deformity, Normal muscle strengthen. Back  is symmetric, no curvature. Skin: Skin color, texture, turgor normal. No rashes or lesions  Lymph nodes: Cervical, supraclavicular, and axillary nodes normal.  Neurologic: CN 2 -12 Normal, Normal symmetric reflexes. Normal coordination and gait  Psych: Alert & Oriented x 3, Mood appear stable.    Assessment:    Annual wellness medicare exam   Plan:    During the course of the visit the patient was educated and counseled about appropriate screening and preventive services including:   Annual mammogram  Annual flu vaccine up-to-date  Tetanus immunization up-to-date given in 2021  COVID-19 vaccines up-to-date  Had Cologuard last year.  Not sure that insurance company will pay for it this year     Patient Instructions (the written plan) was given to the patient.  Medicare Attestation  I have personally reviewed:  The patient's medical and social history  Their use of alcohol, tobacco or illicit drugs  Their current medications and  supplements  The patient's functional ability including ADLs,fall risks, home safety risks, cognitive, and hearing and visual impairment  Diet and physical activities  Evidence for depression or mood disorders  The patient's weight, height, BMI, and visual acuity have been recorded in the chart. I have made referrals, counseling, and provided education to the patient based on review of the above and I have provided the patient with a written personalized care plan for preventive services.

## 2020-03-12 NOTE — Patient Instructions (Addendum)
It was a pleasure to see you today.  I am not sure that Cologuard will be paid for by Medicare yearly.  I think it is covered every 3 years.  Your immunizations are up-to-date.  Return in 1 year or as needed.  Continue current medications.  Have annual mammogram and flu vaccine.

## 2020-03-13 LAB — LIPID PANEL
Cholesterol: 172 mg/dL (ref <200)
HDL: 54 mg/dL (ref >=50)
LDL Cholesterol (Calc): 97 mg/dL (calc)
Non-HDL Cholesterol (Calc): 118 mg/dL (calc) (ref <130)
Total CHOL/HDL Ratio: 3.2 (calc) (ref <5.0)
Triglycerides: 112 mg/dL (ref <150)

## 2020-03-13 LAB — CBC WITH DIFFERENTIAL/PLATELET
Absolute Monocytes: 561 cells/uL (ref 200–950)
Basophils Absolute: 63 cells/uL (ref 0–200)
Basophils Relative: 0.8 %
Eosinophils Absolute: 316 cells/uL (ref 15–500)
Eosinophils Relative: 4 %
HCT: 44.2 % (ref 35.0–45.0)
Hemoglobin: 15.3 g/dL (ref 11.7–15.5)
Lymphs Abs: 1304 cells/uL (ref 850–3900)
MCH: 29.9 pg (ref 27.0–33.0)
MCHC: 34.6 g/dL (ref 32.0–36.0)
MCV: 86.3 fL (ref 80.0–100.0)
MPV: 10.3 fL (ref 7.5–12.5)
Monocytes Relative: 7.1 %
Neutro Abs: 5656 cells/uL (ref 1500–7800)
Neutrophils Relative %: 71.6 %
Platelets: 307 10*3/uL (ref 140–400)
RBC: 5.12 10*6/uL — ABNORMAL HIGH (ref 3.80–5.10)
RDW: 13.6 % (ref 11.0–15.0)
Total Lymphocyte: 16.5 %
WBC: 7.9 10*3/uL (ref 3.8–10.8)

## 2020-03-13 LAB — COMPLETE METABOLIC PANEL WITH GFR
AG Ratio: 1.6 (calc) (ref 1.0–2.5)
ALT: 23 U/L (ref 6–29)
AST: 28 U/L (ref 10–35)
Albumin: 4.5 g/dL (ref 3.6–5.1)
Alkaline phosphatase (APISO): 96 U/L (ref 37–153)
BUN/Creatinine Ratio: 18 (calc) (ref 6–22)
BUN: 25 mg/dL (ref 7–25)
CO2: 29 mmol/L (ref 20–32)
Calcium: 10.3 mg/dL (ref 8.6–10.4)
Chloride: 101 mmol/L (ref 98–110)
Creat: 1.37 mg/dL — ABNORMAL HIGH (ref 0.60–0.93)
GFR, Est African American: 43 mL/min/{1.73_m2} — ABNORMAL LOW (ref >=60)
GFR, Est Non African American: 37 mL/min/{1.73_m2} — ABNORMAL LOW (ref >=60)
Globulin: 2.8 g/dL (calc) (ref 1.9–3.7)
Glucose, Bld: 86 mg/dL (ref 65–99)
Potassium: 3.8 mmol/L (ref 3.5–5.3)
Sodium: 140 mmol/L (ref 135–146)
Total Bilirubin: 0.6 mg/dL (ref 0.2–1.2)
Total Protein: 7.3 g/dL (ref 6.1–8.1)

## 2020-03-13 LAB — HEMOGLOBIN A1C
Hgb A1c MFr Bld: 5.9 % of total Hgb — ABNORMAL HIGH (ref <5.7)
Mean Plasma Glucose: 123 mg/dL
eAG (mmol/L): 6.8 mmol/L

## 2020-03-13 LAB — TSH: TSH: 1.76 mIU/L (ref 0.40–4.50)

## 2020-03-31 ENCOUNTER — Telehealth: Payer: Self-pay | Admitting: Internal Medicine

## 2020-03-31 NOTE — Telephone Encounter (Signed)
Charlotte Duarte [phone]  Pricilla Holm called inquiring about her lab results that were done at the same time as her CPE because of the bad weather. She said she had not received the results yet.

## 2020-03-31 NOTE — Telephone Encounter (Signed)
She can have it

## 2020-03-31 NOTE — Telephone Encounter (Signed)
She can come and let Joanie draw one day when convenient for pt. She is on Allopurinol to prevent gout. I think this is what she is referring to. Dx :Hx of gout and medication monitoring

## 2020-03-31 NOTE — Telephone Encounter (Deleted)
Patient called back states she was able to get into her mychart and saw her results, she said she noticed that a uric was not done and so she said it needs to be done from now on at her routine labs bc of the medications she is on.

## 2020-04-03 NOTE — Telephone Encounter (Signed)
Called no answer

## 2020-04-03 NOTE — Telephone Encounter (Signed)
Error

## 2020-04-03 NOTE — Telephone Encounter (Signed)
Trying to close encounter accidentally deleted this note. Can you please sign encounter.   Patient called back states she was able to get into her mychart and saw her results, she said she noticed that a uric was not done and so she said it needs to be done from now on at her routine labs bc of the medications she is on Per Dr. Renold Genta she can a uric test. Will let patient know when she calls back.

## 2020-04-03 NOTE — Telephone Encounter (Deleted)
er

## 2020-04-14 ENCOUNTER — Ambulatory Visit: Payer: Medicare Other | Admitting: Physician Assistant

## 2020-05-13 ENCOUNTER — Telehealth: Payer: Self-pay

## 2020-05-13 NOTE — Telephone Encounter (Signed)
Error

## 2020-06-03 DIAGNOSIS — M47816 Spondylosis without myelopathy or radiculopathy, lumbar region: Secondary | ICD-10-CM | POA: Insufficient documentation

## 2020-06-29 ENCOUNTER — Ambulatory Visit (INDEPENDENT_AMBULATORY_CARE_PROVIDER_SITE_OTHER): Payer: Medicare Other | Admitting: Internal Medicine

## 2020-06-29 ENCOUNTER — Other Ambulatory Visit: Payer: Self-pay

## 2020-06-29 ENCOUNTER — Telehealth: Payer: Self-pay

## 2020-06-29 VITALS — HR 86 | Temp 97.7°F

## 2020-06-29 DIAGNOSIS — R059 Cough, unspecified: Secondary | ICD-10-CM

## 2020-06-29 MED ORDER — AZITHROMYCIN 250 MG PO TABS: ORAL_TABLET | ORAL | 0 refills | Status: AC

## 2020-06-29 NOTE — Telephone Encounter (Signed)
Scheduled

## 2020-06-29 NOTE — Telephone Encounter (Signed)
Car visit 2:45 pm

## 2020-06-29 NOTE — Telephone Encounter (Signed)
Patient called started with a dry cough on Saturday, chest hurting temperature fluctuating she feels hot the cold temperature this morning was 97.5. She has a sore throat, she did a home COVID test a few minutes ago and it was negative.

## 2020-06-29 NOTE — Progress Notes (Signed)
   Subjective:    Patient ID: Charlotte Duarte, female    DOB: October 18, 1940, 80 y.o.   MRN: [id number]  HPI 80 year old Female with hypertensive chronic kidney disease seen with  dry cough onset May 7th. No documented fever but has felt hot and cold. Chest is uncomfortable and she has sore throat. Home Covid test was negative.  Has had 3 COVID-19 immunizations.  Had flu vaccine in November.  No shaking chills, nausea vomiting or dysgeusia.  She has stage IIIa chronic kidney disease, hypertension, renal artery stenosis status post left renal artery PTCA and stenting in 2005 with PCI of an in-stent stenosis in 2007.  History of GE reflux and hyperlipidemia.  History of right hip arthroplasty by Dr. Ninfa Linden in 2013.    Review of Systems see above     Objective:   Physical Exam Pulse 86 and regular temperature 97.7 pulse oximetry 97% on room air.  Chest is clear to auscultation without rales or wheezing.  TMs are clear.  Pharynx slightly injected.       Assessment & Plan:  Acute respiratory infection  Plan: COVID-19 PCR test obtained and results are negative.  Patient will take Zithromax Z-PAK 2 tablets day 1 followed by 1 tablet days 2 through 5.  Rest and drink plenty of fluids.

## 2020-06-30 LAB — SARS-COV-2 RNA,(COVID-19) QUALITATIVE NAAT: SARS CoV2 RNA: NOT DETECTED

## 2020-07-08 NOTE — Telephone Encounter (Signed)
See tomorrow

## 2020-07-08 NOTE — Telephone Encounter (Signed)
Patient called she is still coughing some and she is bringing up phlegm there's no color to it. She said should she be worried she said she feels fine she just wanted to let you know of what's going on.

## 2020-07-09 ENCOUNTER — Encounter: Payer: Self-pay | Admitting: Internal Medicine

## 2020-07-09 ENCOUNTER — Other Ambulatory Visit: Payer: Self-pay

## 2020-07-09 ENCOUNTER — Ambulatory Visit: Payer: Medicare Other | Admitting: Internal Medicine

## 2020-07-09 ENCOUNTER — Ambulatory Visit (INDEPENDENT_AMBULATORY_CARE_PROVIDER_SITE_OTHER): Payer: Medicare Other | Admitting: Internal Medicine

## 2020-07-09 VITALS — BP 130/84 | HR 80 | Temp 98.6°F | Ht 59.75 in

## 2020-07-09 DIAGNOSIS — E7800 Pure hypercholesterolemia, unspecified: Secondary | ICD-10-CM | POA: Diagnosis not present

## 2020-07-09 DIAGNOSIS — I10 Essential (primary) hypertension: Secondary | ICD-10-CM | POA: Diagnosis not present

## 2020-07-09 DIAGNOSIS — J22 Unspecified acute lower respiratory infection: Secondary | ICD-10-CM

## 2020-07-09 DIAGNOSIS — Z8679 Personal history of other diseases of the circulatory system: Secondary | ICD-10-CM

## 2020-07-09 DIAGNOSIS — R7302 Impaired glucose tolerance (oral): Secondary | ICD-10-CM | POA: Diagnosis not present

## 2020-07-09 DIAGNOSIS — N1831 Chronic kidney disease, stage 3a: Secondary | ICD-10-CM

## 2020-07-09 DIAGNOSIS — R059 Cough, unspecified: Secondary | ICD-10-CM

## 2020-07-09 MED ORDER — AZITHROMYCIN 250 MG PO TABS: ORAL_TABLET | ORAL | 0 refills | Status: AC

## 2020-07-09 MED ORDER — METHYLPREDNISOLONE ACETATE 80 MG/ML IJ SUSP
80.0000 mg | Freq: Once | INTRAMUSCULAR | Status: AC
  Administered 2020-07-09: 80 mg via INTRAMUSCULAR

## 2020-07-09 NOTE — Patient Instructions (Signed)
Depo-Medrol 80 mg IM given for persistent chest congestion.  Refilled Zithromax Z-PAK 2 tabs day 1 followed by 1 tab days 2 through 5.  Call if not improving in 10 days or sooner if worse.

## 2020-07-09 NOTE — Progress Notes (Signed)
   Subjective:    Patient ID: Charlotte Duarte, female    DOB: April 25, 1940, 80 y.o.   MRN: [id number]  HPI 80 year old Female with history of hypertensive chronic kidney disease last seen on May 9 for dry cough that had onset on May 7.  Had no fever at the time but had felt hot and cold.  She had sore throat.  Time COVID test was negative.  History of stage IIIa chronic kidney disease, hypertension, renal artery stenosis, status post left renal artery PTCA and stenting in 2005 with PCI and in-stent stenosis in 2007.  History of GE reflux and hyperlipidemia.  History of right hip arthroplasty by Dr. Ninfa Linden in 2013  At last visit on May 7, she was prescribed Zithromax Z-PAK.  COVID PCR test was negative.  History of impaired glucose tolerance treated with diet.  Review of Systems says cough has become more productive.  No fever or chills.  Some better with Zithromax that was prescribed at last visit but not well.  Denies shortness of breath.     Objective:   Physical Exam  She is afebrile.  Pulse oximetry is normal. Skin is warm and dry.  Neck is supple without JVD thyromegaly or adenopathy.  Chest is clear to auscultation.  She has a congested cough.  Pharynx is clear.  TMs clear.     Assessment & Plan:  Protracted lower respiratory infection-negative PCR test for COVID-19 at last visit  Plan: Depo-Medrol 80 mg IM.  I would like to renew her Zithromax Z-PAK to take 2 tabs day 1 followed by 1 tab days 2 through 5.  She will let me know if not improving within the next week.

## 2020-07-09 NOTE — Telephone Encounter (Signed)
Left message can see her at 12:15pm, needs to call back to confirm appt.

## 2020-07-09 NOTE — Patient Instructions (Signed)
COVID-19 PCR test is negative.  Rest and drink plenty of fluids.  Take Zithromax Z-PAK 2 tabs day 1 followed by 1 tab days 2 through 5.

## 2020-07-28 ENCOUNTER — Other Ambulatory Visit: Payer: Self-pay | Admitting: Internal Medicine

## 2020-08-04 DIAGNOSIS — H02834 Dermatochalasis of left upper eyelid: Secondary | ICD-10-CM | POA: Diagnosis not present

## 2020-08-04 DIAGNOSIS — H1045 Other chronic allergic conjunctivitis: Secondary | ICD-10-CM | POA: Diagnosis not present

## 2020-08-04 DIAGNOSIS — H02831 Dermatochalasis of right upper eyelid: Secondary | ICD-10-CM | POA: Diagnosis not present

## 2020-08-04 DIAGNOSIS — Z961 Presence of intraocular lens: Secondary | ICD-10-CM | POA: Diagnosis not present

## 2020-08-04 DIAGNOSIS — H26492 Other secondary cataract, left eye: Secondary | ICD-10-CM | POA: Diagnosis not present

## 2020-08-04 DIAGNOSIS — H3554 Dystrophies primarily involving the retinal pigment epithelium: Secondary | ICD-10-CM | POA: Diagnosis not present

## 2020-08-10 ENCOUNTER — Other Ambulatory Visit: Payer: Self-pay

## 2020-08-10 ENCOUNTER — Encounter (INDEPENDENT_AMBULATORY_CARE_PROVIDER_SITE_OTHER): Payer: Self-pay | Admitting: Ophthalmology

## 2020-08-10 ENCOUNTER — Ambulatory Visit (INDEPENDENT_AMBULATORY_CARE_PROVIDER_SITE_OTHER): Payer: Medicare Other | Admitting: Ophthalmology

## 2020-08-10 DIAGNOSIS — H353132 Nonexudative age-related macular degeneration, bilateral, intermediate dry stage: Secondary | ICD-10-CM

## 2020-08-10 DIAGNOSIS — H353112 Nonexudative age-related macular degeneration, right eye, intermediate dry stage: Secondary | ICD-10-CM | POA: Diagnosis not present

## 2020-08-10 DIAGNOSIS — H26492 Other secondary cataract, left eye: Secondary | ICD-10-CM | POA: Diagnosis not present

## 2020-08-10 DIAGNOSIS — Z961 Presence of intraocular lens: Secondary | ICD-10-CM

## 2020-08-10 DIAGNOSIS — R0683 Snoring: Secondary | ICD-10-CM

## 2020-08-10 NOTE — Progress Notes (Signed)
08/10/2020     CHIEF COMPLAINT Patient presents for Macular Degeneration   HISTORY OF PRESENT ILLNESS: Charlotte Duarte is a 80 y.o. female who presents to the clinic today for:     Referring physician: Elby Showers, MD [id number],  [place] [id number]  HISTORICAL INFORMATION:   Selected notes from the [facility]    Lab Results  Component Value Date   HGBA1C 5.9 (H) 03/12/2020     CURRENT MEDICATIONS: No current outpatient medications on file. (Ophthalmic Drugs)   No current facility-administered medications for this visit. (Ophthalmic Drugs)   Current Outpatient Medications (Other)  Medication Sig   allopurinol (ZYLOPRIM) 100 MG tablet TAKE 1 TABLET BY MOUTH  DAILY   aspirin EC 81 MG tablet Take 81 mg by mouth daily.   esomeprazole (NEXIUM) 20 MG packet Take 20 mg by mouth daily before breakfast.   fexofenadine (ALLEGRA) 180 MG tablet Take 180 mg by mouth daily. Reported on 04/09/2015   rosuvastatin (CRESTOR) 5 MG tablet TAKE 1 TABLET BY MOUTH  DAILY   triamterene-hydrochlorothiazide (MAXZIDE) 75-50 MG tablet TAKE 1 TABLET BY MOUTH  DAILY WITH BREAKFAST   valACYclovir (VALTREX) 500 MG tablet See admin instructions.   No current facility-administered medications for this visit. (Other)      REVIEW OF SYSTEMS:    ALLERGIES Allergies  Allergen Reactions   Amoxicillin Anaphylaxis   Keflex [Cephalexin] Hives and Itching    "Uncontrollable itching"    Augmentin [Amoxicillin-Pot Clavulanate] Nausea And Vomiting    Has patient had a PCN reaction causing immediate rash, facial/tongue/throat swelling, SOB or lightheadedness with hypotension: No Has patient had a PCN reaction causing severe rash involving mucus membranes or skin necrosis: No Has patient had a PCN reaction that required hospitalization: No Has patient had a PCN reaction occurring within the last 10 years: No If all of the above answers are "NO", then may proceed with  Cephalosporin use.    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  RENAL STINT REPLACE 03/2005.   TOTAL HIP ARTHROPLASTY  11/11/2011   Procedure: TOTAL HIP ARTHROPLASTY ANTERIOR APPROACH;  Surgeon: Mcarthur Rossetti, MD;  Location: [facility];  Service: Orthopedics;  Laterality: Right;  Right Total Hip Arthroplasty    FAMILY HISTORY Family History  Problem Relation Age of Onset   Mental illness Mother    Breast cancer Mother 76   Hypertension Mother    Heart disease Father  ALS Brother    Stroke Brother     SOCIAL HISTORY Social History   Tobacco Use   Smoking status: Former    Pack years: 0.00    Types: Cigarettes    Quit date: 07/27/1970    Years since quitting: 50.0   Smokeless tobacco: Never  Substance Use Topics   Alcohol use: Yes    Alcohol/week: 10.0 standard drinks    Types: 10 Glasses of wine per week    Comment: 1-2 glasses of wine daily   Drug use: No         OPHTHALMIC EXAM:  Base Eye Exam     Visual Acuity (ETDRS)       Right Left   Dist [place] 20/100 20/20 -2   Dist ph [place] 20/50 -2          Tonometry (Tonopen, 9:25 AM)       Right Left   Pressure  15 14         Pupils       Pupils React APD   Right PERRL Brisk None   Left PERRL Brisk None         Dilation     Both eyes: 1.0% Mydriacyl, 2.5% Phenylephrine @ 9:24 AM           Slit Lamp and Fundus Exam     External Exam       Right Left   External Normal Normal         Slit Lamp Exam       Right Left   Lids/Lashes Normal Normal   Conjunctiva/Sclera White and quiet White and quiet   Cornea Clear Clear   Anterior Chamber Deep and quiet Deep and quiet   Iris Round and reactive Round and reactive   Lens Centered posterior chamber intraocular lens, Open posterior capsule Centered posterior chamber intraocular lens, 1+ Posterior capsular opacification   Anterior Vitreous Normal Normal         Fundus Exam       Right Left   Posterior Vitreous Vitreous debris, no overt Weiss ring Normal   Disc Normal Normal   C/D Ratio 0.2 0.2   Macula Subfoveal drusenoid deposit, approximately 600 m in diameter, no adjacent fluid, pigmentary changes well, adult vitelliform macular dystrophy appearance Hard drusen, no macular thickening   Vessels Normal Normal   Periphery Normal Normal            IMAGING AND PROCEDURES  Imaging and Procedures for 08/10/20  OCT, Retina - OU - Both Eyes       Right Eye Quality was good. Scan locations included subfoveal. Central Foveal Thickness: 333. Progression has no prior data. Findings include no SRF, no IRF, retinal drusen , subretinal hyper-reflective material, pigment epithelial detachment.   Left Eye Quality was good. Scan locations included subfoveal. Central Foveal Thickness: 269. Progression has no prior data. Findings include normal foveal contour, no SRF, no IRF, retinal drusen .   Notes Subfoveal drusenoid type deposit of adult vitelliform macular dystrophy right eye.  No overt vitreomacular adhesion Yet suspicious for small intraretinal foveal traction which if traction is present on the fovea, could lead to this  type of drusenoid appearance.  OS incidental perifoveal operculum, no hole.  Diffuse punctate hard drusen at the level of Bruch's membrane             ASSESSMENT/PLAN:  Snores I have urged the patient to seek home sleep testing via her PCP.  She is  instructed to notify the PCP that she has been told that her husband notices that she snores may have sleep apnea and she has the other symptoms of fatigability and easily falling asleep and nocturia nightly that are significant for sleep apnea.  I have instructed her that maintenance and prevention of nightly low oxygen (hypoxia) by treatment of undiagnosed or untreated sleep apnea, will delay or slow hypoxic damage to the center of the vision seen and macular degeneration and and other aging disorders .  Not to mention the potential benefits to the CNS slowing aging as well of improved sense of wellbeing, energy, less napping, less nocturia should sleep apnea be present and should it be effectively treated.  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  Pseudophakia, both eyes Well centered IOLs observe  Left posterior capsular opacification OS minor no active impact on acuity     ICD-10-CM   1. Intermediate stage nonexudative age-related macular degeneration of both eyes  H35.3132 OCT, Retina - OU - Both Eyes    2. Snores  R06.83     3. Intermediate stage nonexudative age-related macular degeneration of right eye  H35.3112     4. Pseudophakia, both eyes  Z96.1     5. Left posterior capsular opacification  H26.492       1.  No signs of complication from  ARMD OU.  2.  Patient does have potential exacerbating factor of nightly hypoxia from untreated sleep apnea and undiagnosed sleep apnea.  Review of symptoms for OSA is strong.  I have urged patient to seek testing through her PCP Dr. Tommie Ard Baxley  3.  Patient instructed to follow-up promptly for new onset visual acuity declines or distortions  Ophthalmic Meds Ordered this visit:  No orders of the defined types were placed in this encounter.      Return in about 4 months (around 12/10/2020) for DILATE OU, OCT.  There are no Patient Instructions on file for this visit.   Explained the diagnoses, plan, and follow up with the patient and they expressed understanding.  Patient expressed understanding of the importance of proper follow up care.   Clent Demark Jim Philemon M.D. Diseases & Surgery of the Retina and Vitreous Retina & Diabetic [place] [facility] 08/10/20     Abbreviations: M myopia (nearsighted); A astigmatism; H hyperopia (farsighted); P presbyopia; Mrx spectacle prescription;  CTL contact lenses; OD right eye; OS left eye; OU both eyes  XT exotropia; ET esotropia; PEK punctate epithelial keratitis; PEE punctate epithelial erosions; DES dry eye syndrome; MGD meibomian gland dysfunction; ATs artificial tears; PFAT's preservative free artificial tears; [place] nuclear sclerotic cataract; PSC posterior subcapsular cataract; ERM epi-retinal membrane; PVD posterior vitreous detachment; RD retinal detachment; DM diabetes mellitus; DR diabetic retinopathy; NPDR non-proliferative diabetic retinopathy; PDR proliferative diabetic retinopathy; CSME clinically significant macular edema; DME diabetic macular edema; dbh dot blot hemorrhages; CWS cotton wool spot; POAG primary open angle glaucoma; C/D cup-to-disc ratio; HVF humphrey visual field; GVF goldmann visual field; OCT optical coherence tomography; IOP intraocular pressure; BRVO Branch retinal vein occlusion; CRVO central retinal vein occlusion; CRAO  central retinal artery occlusion; BRAO branch retinal  artery occlusion; RT retinal tear; SB scleral buckle; PPV pars plana vitrectomy; VH Vitreous hemorrhage; PRP panretinal laser photocoagulation; IVK intravitreal kenalog; VMT vitreomacular traction; MH Macular hole;  NVD neovascularization of the disc; NVE neovascularization elsewhere; AREDS age related eye disease study; ARMD age related macular degeneration; POAG primary open angle glaucoma; EBMD epithelial/anterior basement membrane dystrophy; ACIOL anterior chamber intraocular lens; IOL intraocular lens; PCIOL posterior chamber intraocular lens; Phaco/IOL phacoemulsification with intraocular lens placement; [place] photorefractive keratectomy; LASIK laser assisted in situ keratomileusis; HTN hypertension; DM diabetes mellitus; COPD chronic obstructive pulmonary disease

## 2020-08-10 NOTE — Assessment & Plan Note (Addendum)
OS   minor no active impact on acuity

## 2020-08-10 NOTE — Assessment & Plan Note (Signed)
Well centered IOLs observe

## 2020-08-10 NOTE — Assessment & Plan Note (Signed)
I have urged the patient to seek home sleep testing via her PCP.  She is instructed to notify the PCP that she has been told that her husband notices that she snores may have sleep apnea and she has the other symptoms of fatigability and easily falling asleep and nocturia nightly that are significant for sleep apnea.  I have instructed her that maintenance and prevention of nightly low oxygen (hypoxia) by treatment of undiagnosed or untreated sleep apnea, will delay or slow hypoxic damage to the center of the vision seen and macular degeneration and and other aging disorders .  Not to mention the potential benefits to the CNS slowing aging as well of improved sense of wellbeing, energy, less napping, less nocturia should sleep apnea be present and should it be effectively treated.  I urged her to undergo therapeutic trial of effective use of treatment for  at least 2 weeks of sleep apnea is diagnosed

## 2020-08-10 NOTE — Assessment & Plan Note (Addendum)
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

## 2020-08-20 DIAGNOSIS — N1832 Chronic kidney disease, stage 3b: Secondary | ICD-10-CM | POA: Diagnosis not present

## 2020-08-20 DIAGNOSIS — I701 Atherosclerosis of renal artery: Secondary | ICD-10-CM | POA: Diagnosis not present

## 2020-08-20 DIAGNOSIS — M109 Gout, unspecified: Secondary | ICD-10-CM | POA: Diagnosis not present

## 2020-08-20 DIAGNOSIS — I129 Hypertensive chronic kidney disease with stage 1 through stage 4 chronic kidney disease, or unspecified chronic kidney disease: Secondary | ICD-10-CM | POA: Diagnosis not present

## 2020-08-20 DIAGNOSIS — Z905 Acquired absence of kidney: Secondary | ICD-10-CM | POA: Diagnosis not present

## 2020-10-08 DIAGNOSIS — H903 Sensorineural hearing loss, bilateral: Secondary | ICD-10-CM | POA: Diagnosis not present

## 2020-12-10 ENCOUNTER — Encounter (INDEPENDENT_AMBULATORY_CARE_PROVIDER_SITE_OTHER): Payer: Medicare Other | Admitting: Ophthalmology

## 2020-12-14 DIAGNOSIS — M19011 Primary osteoarthritis, right shoulder: Secondary | ICD-10-CM | POA: Diagnosis not present

## 2020-12-29 ENCOUNTER — Encounter (INDEPENDENT_AMBULATORY_CARE_PROVIDER_SITE_OTHER): Payer: Self-pay | Admitting: Ophthalmology

## 2020-12-29 ENCOUNTER — Other Ambulatory Visit: Payer: Self-pay

## 2020-12-29 ENCOUNTER — Ambulatory Visit (INDEPENDENT_AMBULATORY_CARE_PROVIDER_SITE_OTHER): Payer: Medicare Other | Admitting: Ophthalmology

## 2020-12-29 DIAGNOSIS — H43821 Vitreomacular adhesion, right eye: Secondary | ICD-10-CM | POA: Diagnosis not present

## 2020-12-29 DIAGNOSIS — H353132 Nonexudative age-related macular degeneration, bilateral, intermediate dry stage: Secondary | ICD-10-CM | POA: Diagnosis not present

## 2020-12-29 DIAGNOSIS — H3554 Dystrophies primarily involving the retinal pigment epithelium: Secondary | ICD-10-CM | POA: Diagnosis not present

## 2020-12-29 NOTE — Assessment & Plan Note (Signed)
Patient continues on oral vitamin supplementation

## 2020-12-29 NOTE — Assessment & Plan Note (Signed)
Vitreomacular adhesion can often elevate the central foveal region and lead to this hyperintense subretinal debris noted in a pseudo vitelliform type fashion OD.  Clearly clinically there is no PVD and this is confirmed by B-scan and also OCT

## 2020-12-29 NOTE — Progress Notes (Signed)
12/29/2020     CHIEF COMPLAINT Patient presents for  Chief Complaint  Patient presents with   Retina Follow Up      HISTORY OF PRESENT ILLNESS: Charlotte Duarte is a 80 y.o. female who presents to the clinic today for:   HPI     Retina Follow Up   Patient presents with  Dry AMD.  In both eyes.  This started 4 months ago.  Severity is mild.  Duration of 4 months.  Since onset it is stable.        Comments   4 mos fu OU oct. Patient states vision is stable and unchanged since last visit. Denies any new floaters or FOL. Pt has question for Dr. Zadie Rhine, "there is nothing to do about this problem I have, what is the point of doing the follow ups if there is nothing to do for it?"      Last edited by Laurin Coder on 12/29/2020  1:18 PM.      Referring physician: Clent Jacks, MD [address] [place],  [place] [address]  HISTORICAL INFORMATION:   Selected notes from the MEDICAL RECORD NUMBER    Lab Results  Component Value Date   HGBA1C 5.9 (H) 03/12/2020     CURRENT MEDICATIONS: No current outpatient medications on file. (Ophthalmic Drugs)   No current facility-administered medications for this visit. (Ophthalmic Drugs)   Current Outpatient Medications (Other)  Medication Sig   allopurinol (ZYLOPRIM) 100 MG tablet TAKE 1 TABLET BY MOUTH  DAILY   aspirin EC 81 MG tablet Take 81 mg by mouth daily.   esomeprazole (NEXIUM) 20 MG packet Take 20 mg by mouth daily before breakfast.   fexofenadine (ALLEGRA) 180 MG tablet Take 180 mg by mouth daily. Reported on 04/09/2015   rosuvastatin (CRESTOR) 5 MG tablet TAKE 1 TABLET BY MOUTH  DAILY   triamterene-hydrochlorothiazide (MAXZIDE) 75-50 MG tablet TAKE 1 TABLET BY MOUTH  DAILY WITH BREAKFAST   valACYclovir (VALTREX) 500 MG tablet See admin instructions.   No current facility-administered medications for this visit. (Other)      REVIEW OF SYSTEMS:    ALLERGIES Allergies  Allergen Reactions   Amoxicillin  Anaphylaxis   Keflex [Cephalexin] Hives and Itching    "Uncontrollable itching"    Augmentin [Amoxicillin-Pot Clavulanate] Nausea And Vomiting    Has patient had a PCN reaction causing immediate rash, facial/tongue/throat swelling, SOB or lightheadedness with hypotension: No Has patient had a PCN reaction causing severe rash involving mucus membranes or skin necrosis: No Has patient had a PCN reaction that required hospitalization: No Has patient had a PCN reaction occurring within the last 10 years: No If all of the above answers are "NO", then may proceed with Cephalosporin use.    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  RENAL STINT REPLACE 03/2005.   TOTAL HIP ARTHROPLASTY  11/11/2011   Procedure: TOTAL HIP ARTHROPLASTY ANTERIOR APPROACH;  Surgeon: Mcarthur Rossetti, MD;  Location: [facility];  Service: Orthopedics;  Laterality: Right;  Right Total Hip Arthroplasty    FAMILY HISTORY Family History  Problem Relation Age of Onset   Mental illness Mother    Breast cancer Mother 92   Hypertension Mother    Heart disease Father    ALS Brother    Stroke Brother     SOCIAL  HISTORY Social History   Tobacco Use   Smoking status: Former    Types: Cigarettes    Quit date: 07/27/1970    Years since quitting: 50.4   Smokeless tobacco: Never  Substance Use Topics   Alcohol use: Yes    Alcohol/week: 10.0 standard drinks    Types: 10 Glasses of wine per week    Comment: 1-2 glasses of wine daily   Drug use: No         OPHTHALMIC EXAM:  Base Eye Exam     Visual Acuity (ETDRS)       Right Left   Dist [place] 20/100 20/20 -2   Dist ph [place] 20/60 -1          Tonometry (Tonopen, 1:22 PM)       Right Left   Pressure 13 12         Pupils       Pupils Dark Light Shape React APD   Right PERRL 3 2 Round Brisk None   Left PERRL 3 2 Round Brisk None         Extraocular Movement       Right Left    Full Full         Neuro/Psych     Oriented x3: Yes   Mood/Affect: Normal         Dilation     Both eyes: 1.0% Mydriacyl, 2.5% Phenylephrine @ 1:22 PM           Slit Lamp and Fundus Exam     External Exam       Right Left   External Normal Normal         Slit Lamp Exam       Right Left   Lids/Lashes Normal Normal   Conjunctiva/Sclera White and quiet White and quiet   Cornea Clear Clear   Anterior Chamber Deep and quiet Deep and quiet   Iris Round and reactive Round and reactive   Lens Centered posterior chamber intraocular lens, Open posterior capsule Centered posterior chamber intraocular lens, 1+ Posterior capsular opacification   Anterior Vitreous Normal Normal         Fundus Exam       Right Left   Posterior Vitreous Vitreous debris, no overt Weiss ring Normal   Disc Normal Normal   C/D Ratio 0.2 0.2   Macula Subfoveal drusenoid deposit pseudovitelliform like, approximately 600 m in diameter, no adjacent fluid, pigmentary changes well, adult vitelliform macular dystrophy appearance Hard drusen, no macular thickening   Vessels Normal Normal   Periphery Normal Normal  IMAGING AND PROCEDURES   Imaging and Procedures for 12/29/20  OCT, Retina - OU - Both Eyes       Right Eye Quality was good. Scan locations included subfoveal. Central Foveal Thickness: 316. Progression has no prior data. Findings include no SRF, no IRF, retinal drusen , subretinal hyper-reflective material, pigment epithelial detachment.   Left Eye Quality was good. Scan locations included subfoveal. Central Foveal Thickness: 266. Progression has no prior data. Findings include normal foveal contour, no SRF, no IRF, retinal drusen .   Notes Subfoveal drusenoid type deposit of adult vitelliform macular dystrophy right eye.  No overt vitreomacular adhesion Yet suspicious for small intraretinal foveal traction which if traction is present on the fovea, could lead to this type of drusenoid appearance.  Still no definable PVD OD  OS incidental perifoveal operculum, no hole.  Diffuse punctate hard drusen at the level of Bruch's membrane     B-Scan Ultrasound - OD - Right Eye       Quality was good.   Notes Static and dynamic scans confirmed the vitreous is attached in a diffuse pattern and normal physiologic which could explain a broad-based elevation of the foveal region with a secondary subfoveal hyperintense, hyper reflective material , debris seen on OCT  No holes or tears             ASSESSMENT/PLAN:  Vitreomacular adhesion of right eye Vitreomacular adhesion can often elevate the central foveal region and lead to this hyperintense subretinal debris noted in a pseudo vitelliform type fashion OD.  Clearly clinically there is no PVD and this is confirmed by B-scan and also OCT  Adult vitelliform macular dystrophy I reviewed the potential role of VMA giving patient demonstrable OCT photos of a separate patient who had bilateral VMA and foveal elevation with serous elevation but also with subfoveal deposits With hyper reflective material similar to her situation.  And how that patient also improved  post vitrectomy, which released the broad-based vitreomacular elevation     ICD-10-CM   1. Intermediate stage nonexudative age-related macular degeneration of both eyes  H35.3132 OCT, Retina - OU - Both Eyes    2. Vitreomacular adhesion of right eye  H43.821 B-Scan Ultrasound - OD - Right Eye    3. 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  ARMD OU.  Patient continues on oral vitamin supplementation  2.  Unusual but not rare vitelliform eye deposit subfoveal OD of adult vitelliform macular dystrophy noted clinically however there is clear evidence by OCT and also by B-scan there may be broad-based vitreal macular adhesion which can elevate the fovea leading to focal subretinal hyper reflective material noted on OCT and pseudo vitelliform appearance on clinical examination.  These findings were reviewed.  3.  I discussed with patient that I could offer vitrectomy to release this traction but with no guarantee of the ultimate visual outcome because of the underlying macular degeneration but nonetheless I did show her a patient with bilateral disease and the outcome that that patient experience in each eye  Ophthalmic Meds Ordered this visit:  No orders of the defined types were placed in this encounter.      Return in about 5 months (around 05/29/2021) for DILATE OU, COLOR FP, OCT.  There are no Patient Instructions on file for this visit.   Explained the diagnoses, plan, and follow up with the patient and they expressed understanding.  Patient expressed understanding of the importance of proper  follow up care.   Clent Demark Equilla Que M.D. Diseases & Surgery of the Retina and Vitreous Retina & Diabetic [place] 12/29/20     Abbreviations: M myopia (nearsighted); A astigmatism; H hyperopia (farsighted); P presbyopia; Mrx spectacle prescription;  CTL contact lenses; OD right eye; OS left eye; OU both eyes  XT exotropia; ET esotropia; PEK punctate epithelial keratitis; PEE  punctate epithelial erosions; DES dry eye syndrome; MGD meibomian gland dysfunction; ATs artificial tears; PFAT's preservative free artificial tears; [place] nuclear sclerotic cataract; PSC posterior subcapsular cataract; ERM epi-retinal membrane; PVD posterior vitreous detachment; RD retinal detachment; DM diabetes mellitus; DR diabetic retinopathy; NPDR non-proliferative diabetic retinopathy; PDR proliferative diabetic retinopathy; CSME clinically significant macular edema; DME diabetic macular edema; dbh dot blot hemorrhages; CWS cotton wool spot; POAG primary open angle glaucoma; C/D cup-to-disc ratio; HVF humphrey visual field; GVF goldmann visual field; OCT optical coherence tomography; IOP intraocular pressure; BRVO Branch retinal vein occlusion; CRVO central retinal vein occlusion; CRAO central retinal artery occlusion; BRAO branch retinal artery occlusion; RT retinal tear; SB scleral buckle; PPV pars plana vitrectomy; VH Vitreous hemorrhage; PRP panretinal laser photocoagulation; IVK intravitreal kenalog; VMT vitreomacular traction; MH Macular hole;  NVD neovascularization of the disc; NVE neovascularization elsewhere; AREDS age related eye disease study; ARMD age related macular degeneration; POAG primary open angle glaucoma; EBMD epithelial/anterior basement membrane dystrophy; ACIOL anterior chamber intraocular lens; IOL intraocular lens; PCIOL posterior chamber intraocular lens; Phaco/IOL phacoemulsification with intraocular lens placement; [place] photorefractive keratectomy; LASIK laser assisted in situ keratomileusis; HTN hypertension; DM diabetes mellitus; COPD chronic obstructive pulmonary disease

## 2020-12-29 NOTE — Assessment & Plan Note (Signed)
I reviewed the potential role of VMA giving patient demonstrable OCT photos of a separate patient who had bilateral VMA and foveal elevation with serous elevation but also with subfoveal deposits With hyper reflective material similar to her situation.  And how that patient also improved post vitrectomy, which released the broad-based vitreomacular elevation

## 2021-01-03 ENCOUNTER — Other Ambulatory Visit: Payer: Self-pay | Admitting: Internal Medicine

## 2021-01-11 DIAGNOSIS — L578 Other skin changes due to chronic exposure to nonionizing radiation: Secondary | ICD-10-CM | POA: Diagnosis not present

## 2021-01-11 DIAGNOSIS — L570 Actinic keratosis: Secondary | ICD-10-CM | POA: Diagnosis not present

## 2021-01-11 DIAGNOSIS — D044 Carcinoma in situ of skin of scalp and neck: Secondary | ICD-10-CM | POA: Diagnosis not present

## 2021-01-11 DIAGNOSIS — L820 Inflamed seborrheic keratosis: Secondary | ICD-10-CM | POA: Diagnosis not present

## 2021-02-02 DIAGNOSIS — Z961 Presence of intraocular lens: Secondary | ICD-10-CM | POA: Diagnosis not present

## 2021-02-02 DIAGNOSIS — H26492 Other secondary cataract, left eye: Secondary | ICD-10-CM | POA: Diagnosis not present

## 2021-02-02 DIAGNOSIS — H43821 Vitreomacular adhesion, right eye: Secondary | ICD-10-CM | POA: Diagnosis not present

## 2021-02-02 DIAGNOSIS — H3554 Dystrophies primarily involving the retinal pigment epithelium: Secondary | ICD-10-CM | POA: Diagnosis not present

## 2021-02-02 DIAGNOSIS — H02831 Dermatochalasis of right upper eyelid: Secondary | ICD-10-CM | POA: Diagnosis not present

## 2021-02-02 DIAGNOSIS — H1045 Other chronic allergic conjunctivitis: Secondary | ICD-10-CM | POA: Diagnosis not present

## 2021-02-02 DIAGNOSIS — H02834 Dermatochalasis of left upper eyelid: Secondary | ICD-10-CM | POA: Diagnosis not present

## 2021-02-02 LAB — HM DIABETES EYE EXAM

## 2021-02-10 DIAGNOSIS — R051 Acute cough: Secondary | ICD-10-CM | POA: Diagnosis not present

## 2021-02-10 DIAGNOSIS — Z20822 Contact with and (suspected) exposure to covid-19: Secondary | ICD-10-CM | POA: Diagnosis not present

## 2021-03-15 ENCOUNTER — Encounter: Payer: Self-pay | Admitting: Internal Medicine

## 2021-03-15 DIAGNOSIS — Z1231 Encounter for screening mammogram for malignant neoplasm of breast: Secondary | ICD-10-CM | POA: Diagnosis not present

## 2021-03-16 DIAGNOSIS — C4442 Squamous cell carcinoma of skin of scalp and neck: Secondary | ICD-10-CM | POA: Diagnosis not present

## 2021-03-22 DIAGNOSIS — I701 Atherosclerosis of renal artery: Secondary | ICD-10-CM | POA: Diagnosis not present

## 2021-03-22 DIAGNOSIS — M109 Gout, unspecified: Secondary | ICD-10-CM | POA: Diagnosis not present

## 2021-03-22 DIAGNOSIS — I129 Hypertensive chronic kidney disease with stage 1 through stage 4 chronic kidney disease, or unspecified chronic kidney disease: Secondary | ICD-10-CM | POA: Diagnosis not present

## 2021-03-22 DIAGNOSIS — Z905 Acquired absence of kidney: Secondary | ICD-10-CM | POA: Diagnosis not present

## 2021-03-22 DIAGNOSIS — N1832 Chronic kidney disease, stage 3b: Secondary | ICD-10-CM | POA: Diagnosis not present

## 2021-04-07 ENCOUNTER — Ambulatory Visit (INDEPENDENT_AMBULATORY_CARE_PROVIDER_SITE_OTHER): Payer: Medicare Other | Admitting: Ophthalmology

## 2021-04-07 ENCOUNTER — Encounter (INDEPENDENT_AMBULATORY_CARE_PROVIDER_SITE_OTHER): Payer: Self-pay | Admitting: Ophthalmology

## 2021-04-07 ENCOUNTER — Other Ambulatory Visit: Payer: Self-pay

## 2021-04-07 DIAGNOSIS — H353132 Nonexudative age-related macular degeneration, bilateral, intermediate dry stage: Secondary | ICD-10-CM

## 2021-04-07 DIAGNOSIS — H43821 Vitreomacular adhesion, right eye: Secondary | ICD-10-CM | POA: Diagnosis not present

## 2021-04-07 DIAGNOSIS — H353112 Nonexudative age-related macular degeneration, right eye, intermediate dry stage: Secondary | ICD-10-CM

## 2021-04-07 NOTE — Assessment & Plan Note (Signed)
Large subfoveal drusenoid deposits similar to his stage I type macular hole likely from broad-based vitreomacular traction as there is no definable PVD on B-scan or on OCT.  Or clinically.

## 2021-04-07 NOTE — Assessment & Plan Note (Addendum)
Plan OD today is to use B-scan ultrasonography with static and kinetic scanning to determine whether or not evidence for posterior vitreous detachment or still a detachment to the posterior pole is present in the right eye which could be contributing to foveal macular broad-based tractional changes and to this macular hole, stage I, subfoveal drusenoid appearance  OD with definable visual impact now, I explained to the patient that vitrectomy to remove the posterior hyaloid has a really excellent chance of releasing the tugging and traction broad-based evidence on the macular region and hopefully this will allow the traction to trigger a resolution of the subfoveal drusenoid deposit and protect from further atrophy and regain her stabilize vision

## 2021-04-07 NOTE — Progress Notes (Signed)
04/07/2021     CHIEF COMPLAINT Patient presents for  Chief Complaint  Patient presents with   Macular Degeneration      HISTORY OF PRESENT ILLNESS: Charlotte Duarte is a 81 y.o. female who presents to the clinic today for:     Referring physician: Elby Showers, MD [id number],  [place] [id number]  HISTORICAL INFORMATION:   Selected notes from the [facility]    Lab Results  Component Value Date   HGBA1C 5.9 (H) 03/12/2020     CURRENT MEDICATIONS: No current outpatient medications on file. (Ophthalmic Drugs)   No current facility-administered medications for this visit. (Ophthalmic Drugs)   Current Outpatient Medications (Other)  Medication Sig   allopurinol (ZYLOPRIM) 100 MG tablet TAKE 1 TABLET BY MOUTH  DAILY   aspirin EC 81 MG tablet Take 81 mg by mouth daily.   esomeprazole (NEXIUM) 20 MG packet Take 20 mg by mouth daily before breakfast.   fexofenadine (ALLEGRA) 180 MG tablet Take 180 mg by mouth daily. Reported on 04/09/2015   rosuvastatin (CRESTOR) 5 MG tablet TAKE 1 TABLET BY MOUTH  DAILY   triamterene-hydrochlorothiazide (MAXZIDE) 75-50 MG tablet TAKE 1 TABLET BY MOUTH  DAILY WITH BREAKFAST   valACYclovir (VALTREX) 500 MG tablet See admin instructions.   No current facility-administered medications for this visit. (Other)      REVIEW OF SYSTEMS:    ALLERGIES Allergies  Allergen Reactions   Amoxicillin Anaphylaxis   Keflex [Cephalexin] Hives and Itching    "Uncontrollable itching"    Augmentin [Amoxicillin-Pot Clavulanate] Nausea And Vomiting    Has patient had a PCN reaction causing immediate rash, facial/tongue/throat swelling, SOB or lightheadedness with hypotension: No Has patient had a PCN reaction causing severe rash involving mucus membranes or skin necrosis: No Has patient had a PCN reaction that required hospitalization: No Has patient had a PCN reaction occurring within the last 10 years: No If all of the  above answers are "NO", then may proceed with Cephalosporin use.    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  RENAL STINT REPLACE 03/2005.   TOTAL HIP ARTHROPLASTY  11/11/2011   Procedure: TOTAL HIP ARTHROPLASTY ANTERIOR APPROACH;  Surgeon: Mcarthur Rossetti, MD;  Location: [facility];  Service: Orthopedics;  Laterality: Right;  Right Total Hip Arthroplasty    FAMILY HISTORY Family History  Problem Relation Age of Onset   Mental illness Mother    Breast cancer Mother 16  Hypertension Mother    Heart disease Father    ALS Brother    Stroke Brother     SOCIAL HISTORY Social History   Tobacco Use   Smoking status: Former    Types: Cigarettes    Quit date: 07/27/1970    Years since quitting: 50.7   Smokeless tobacco: Never  Substance Use Topics   Alcohol use: Yes    Alcohol/week: 10.0 standard drinks    Types: 10 Glasses of wine per week    Comment: 1-2 glasses of wine daily   Drug use: No         OPHTHALMIC EXAM:  Base Eye Exam     Visual Acuity (ETDRS)       Right Left   Dist [place] 20/100 20/20 -2   Dist ph [place] 20/60 +1          Tonometry (Tonopen, 3:16 PM)        Right Left   Pressure 12 14         Pupils       Pupils APD   Right PERRL None   Left PERRL None         Visual Fields       Left Right    Full Full         Extraocular Movement       Right Left    Full, Ortho Full, Ortho         Neuro/Psych     Oriented x3: Yes   Mood/Affect: Normal         Dilation     Right eye: 1.0% Mydriacyl, 2.5% Phenylephrine @ 3:14 PM           Slit Lamp and Fundus Exam     External Exam       Right Left   External Normal Normal         Slit Lamp Exam       Right Left   Lids/Lashes Normal Normal   Conjunctiva/Sclera White and quiet White and quiet   Cornea Clear Clear   Anterior Chamber Deep and quiet Deep and quiet   Iris Round and reactive Round and reactive   Lens Centered posterior chamber intraocular lens, Open posterior capsule Centered posterior chamber intraocular lens, 1+ Posterior capsular opacification   Anterior Vitreous Normal Normal         Fundus Exam       Right Left   Posterior Vitreous Vitreous debris, no overt Weiss ring    Disc Normal    C/D Ratio 0.2    Macula Subfoveal drusenoid deposit pseudovitelliform like, approximately 600 m in diameter, no adjacent fluid, pigmentary changes well, adult vitelliform macular dystrophy appearance    Vessels Normal    Periphery Normal             IMAGING AND PROCEDURES  Imaging and Procedures for 04/07/21  OCT, Retina - OU - Both Eyes       Right Eye Quality was good. Scan locations included subfoveal. Central Foveal Thickness: 334. Progression has no prior data. Findings include no SRF, no IRF, retinal drusen , subretinal hyper-reflective material, pigment epithelial detachment.   Left Eye Quality was good. Scan locations included subfoveal. Central Foveal Thickness: 266. Progression has no prior data. Findings include normal foveal contour, no SRF, no IRF, retinal drusen .   Notes Subfoveal drusenoid type deposit of adult  vitelliform macular dystrophy right eye.  No overt vitreomacular adhesion Yet suspicious for small  intraretinal foveal traction which if traction is present on the fovea, could lead to this type of drusenoid appearance.  Still no definable PVD OD  OS incidental perifoveal operculum, no hole.  Diffuse punctate hard drusen at the level of Bruch's membrane     B-Scan Ultrasound - OD - Right Eye       Quality was good.   Notes No posterior vitreous detachment in fact the vitreous is adherent throughout the posterior pole both statically and can adequately             ASSESSMENT/PLAN:  Vitreomacular adhesion of right eye Plan OD today is to use B-scan ultrasonography with static and kinetic scanning to determine whether or not evidence for posterior vitreous detachment or still a detachment to the posterior pole is present in the right eye which could be contributing to foveal macular broad-based tractional changes and to this macular hole, stage I, subfoveal drusenoid appearance  OD with definable visual impact now, I explained to the patient that vitrectomy to remove the posterior hyaloid has a really excellent chance of releasing the tugging and traction broad-based evidence on the macular region and hopefully this will allow the traction to trigger a resolution of the subfoveal drusenoid deposit and protect from further atrophy and regain her stabilize vision  Intermediate stage nonexudative age-related macular degeneration of right eye Large subfoveal drusenoid deposits similar to his stage I type macular hole likely from broad-based vitreomacular traction as there is no definable PVD on B-scan or on OCT.  Or clinically.     ICD-10-CM   1. Vitreomacular adhesion of right eye  H43.821 OCT, Retina - OU - Both Eyes    B-Scan Ultrasound - OD - Right Eye    2. Intermediate stage nonexudative age-related macular degeneration of both eyes  H35.3132 OCT, Retina - OU - Both Eyes    3.  Intermediate stage nonexudative age-related macular degeneration of right eye  H35.3112       After lengthy discussion risk benefits and several case presentation was reviewed with the patient, we will proceed with vitrectomy in the right eye to release vitreomacular adhesion so as to allow potentially for the macular condition to resolve without the ongoing traction.  2.  Vitrectomy OD, will schedule, [id number] OD, for diagnosis vitreomacular adhesion, [id number]  3.  Ophthalmic Meds Ordered this visit:  No orders of the defined types were placed in this encounter.      Return [facility], for Schedule vitrectomy OD to remove posterior [phone].  There are no Patient Instructions on file for this visit.   Explained the diagnoses, plan, and follow up with the patient and they expressed understanding.  Patient expressed understanding of the importance of proper follow up care.   Clent Demark Autrey Human M.D. Diseases & Surgery of the Retina and Vitreous Retina & Diabetic [place] 04/07/21     Abbreviations: M myopia (nearsighted); A astigmatism; H hyperopia (farsighted); P presbyopia; Mrx spectacle prescription;  CTL contact lenses; OD right eye; OS left eye; OU both eyes  XT exotropia; ET esotropia; PEK punctate epithelial keratitis; PEE punctate epithelial erosions; DES dry eye syndrome; MGD meibomian gland dysfunction; ATs artificial tears; PFAT's preservative free artificial tears; [place] nuclear sclerotic cataract; PSC posterior subcapsular cataract; ERM epi-retinal membrane; PVD posterior vitreous detachment; RD retinal detachment; DM diabetes mellitus; DR diabetic retinopathy; NPDR non-proliferative diabetic retinopathy; PDR proliferative diabetic retinopathy; CSME clinically significant macular edema; DME diabetic macular edema; dbh dot blot hemorrhages; CWS cotton  wool spot; POAG primary open angle glaucoma; C/D cup-to-disc ratio; HVF humphrey visual field; GVF goldmann visual  field; OCT optical coherence tomography; IOP intraocular pressure; BRVO Branch retinal vein occlusion; CRVO central retinal vein occlusion; CRAO central retinal artery occlusion; BRAO branch retinal artery occlusion; RT retinal tear; SB scleral buckle; PPV pars plana vitrectomy; VH Vitreous hemorrhage; PRP panretinal laser photocoagulation; IVK intravitreal kenalog; VMT vitreomacular traction; MH Macular hole;  NVD neovascularization of the disc; NVE neovascularization elsewhere; AREDS age related eye disease study; ARMD age related macular degeneration; POAG primary open angle glaucoma; EBMD epithelial/anterior basement membrane dystrophy; ACIOL anterior chamber intraocular lens; IOL intraocular lens; PCIOL posterior chamber intraocular lens; Phaco/IOL phacoemulsification with intraocular lens placement; [place] photorefractive keratectomy; LASIK laser assisted in situ keratomileusis; HTN hypertension; DM diabetes mellitus; COPD chronic obstructive pulmonary disease

## 2021-04-21 ENCOUNTER — Other Ambulatory Visit: Payer: Self-pay

## 2021-04-21 ENCOUNTER — Ambulatory Visit (INDEPENDENT_AMBULATORY_CARE_PROVIDER_SITE_OTHER): Payer: Medicare Other

## 2021-04-21 DIAGNOSIS — H43821 Vitreomacular adhesion, right eye: Secondary | ICD-10-CM

## 2021-04-21 MED ORDER — PREDNISOLONE ACETATE 1 % OP SUSP: 1.0000 [drp] | Freq: Four times a day (QID) | OPHTHALMIC | 0 refills | Status: AC

## 2021-04-21 MED ORDER — OFLOXACIN 0.3 % OP SOLN: 1.0000 [drp] | Freq: Four times a day (QID) | OPHTHALMIC | 0 refills | Status: AC

## 2021-04-21 NOTE — Progress Notes (Signed)
? ? ?04/21/2021 ? ?  ? ?CHIEF COMPLAINT ?Patient presents for Pre-op Exam ? ? ?HISTORY OF PRESENT ILLNESS: ?Charlotte Duarte is a 81 y.o. female who presents to the clinic today for:  ? ?HPI   ?Pre op OD Sx 04/28/2021. ?Patient states vision is stable and unchanged since last visit. Denies any new floaters or FOL. ? ?Last edited by Laurin Coder on 04/21/2021  2:17 PM.  ?  ? ? ? ? ?HISTORICAL INFORMATION:  ? ?Selected notes from the [facility] ?  ? ?Lab Results  ?Component Value Date  ? HGBA1C 5.9 (H) 03/12/2020  ?  ? ?CURRENT MEDICATIONS: ?Current Outpatient Medications (Ophthalmic Drugs)  ?Medication Sig  ? ofloxacin (OCUFLOX) 0.3 % ophthalmic solution Place 1 drop into the right eye in the morning, at noon, in the evening, and at bedtime for 21 doses.  ? prednisoLONE acetate (PRED FORTE) 1 % ophthalmic suspension Place 1 drop into the right eye 4 (four) times daily for 21 doses.  ? ?No current facility-administered medications for this visit. (Ophthalmic Drugs)  ? ?Current Outpatient Medications (Other)  ?Medication Sig  ? allopurinol (ZYLOPRIM) 100 MG tablet TAKE 1 TABLET BY MOUTH  DAILY  ? aspirin EC 81 MG tablet Take 81 mg by mouth daily.  ? esomeprazole (NEXIUM) 20 MG packet Take 20 mg by mouth daily before breakfast.  ? fexofenadine (ALLEGRA) 180 MG tablet Take 180 mg by mouth daily. Reported on 04/09/2015  ? rosuvastatin (CRESTOR) 5 MG tablet TAKE 1 TABLET BY MOUTH  DAILY  ? triamterene-hydrochlorothiazide (MAXZIDE) 75-50 MG tablet TAKE 1 TABLET BY MOUTH  DAILY WITH BREAKFAST  ? valACYclovir (VALTREX) 500 MG tablet See admin instructions.  ? ?No current facility-administered medications for this visit. (Other)  ? ? ? ?ALLERGIES ?Allergies  ?Allergen Reactions  ? Amoxicillin Anaphylaxis  ? Keflex [Cephalexin] Hives and Itching  ?  "Uncontrollable itching" ?  ? Augmentin [Amoxicillin-Pot Clavulanate] Nausea And Vomiting  ?  Has patient had a PCN reaction causing immediate rash, facial/tongue/throat  swelling, SOB or lightheadedness with hypotension: No ?Has patient had a PCN reaction causing severe rash involving mucus membranes or skin necrosis: No ?Has patient had a PCN reaction that required hospitalization: No ?Has patient had a PCN reaction occurring within the last 10 years: No ?If all of the above answers are "NO", then may proceed with Cephalosporin use. ?  ? Oseltamivir Nausea Only and Nausea And Vomiting  ? Tamiflu [Oseltamivir Phosphate] Nausea And Vomiting  ? ? ?PAST MEDICAL HISTORY ?Past Medical History:  ?Diagnosis Date  ? Allergy   ? Arthritis   ? Atrophic vaginitis   ? Cataract   ? 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 GERD (gastroesophageal reflux disease)   ? H/O hiatal hernia   ? Hepatitis   ? 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 Hyperlipidemia   ? Hypertension   ? Renal artery stenosis ([place])   ? SUI (stress urinary incontinence, female)   ? ?Past Surgical History:  ?Procedure Laterality Date  ? [place]  ?  both breasts implants REMOVED IN 2005  ? BILATERAL CAPSULECTOMY  2008  ? [place]  ? right  ? CATARACT EXTRACTION  20 yrs ago  ? both eyes   ? EYE SURGERY    ? KNEE ARTHROSCOPY    ? BOTH KNEES  ? RENAL STINT    ? RENAL STINT LEFT RENAL ARTERY IN 2005.  RENAL STINT REPLACE 03/2005.  ?  TOTAL HIP ARTHROPLASTY  11/11/2011  ? Procedure: TOTAL HIP ARTHROPLASTY ANTERIOR APPROACH;  Surgeon: Mcarthur Rossetti, MD;  Location: [facility];  Service: Orthopedics;  Laterality: Right;  Right Total Hip Arthroplasty  ? ? ?FAMILY HISTORY ?Family History  ?Problem Relation Age of Onset  ? Mental illness Mother   ? Breast cancer Mother 38  ? Hypertension Mother   ? Heart disease Father   ? ALS Brother   ? Stroke Brother   ? ? ?SOCIAL HISTORY ?Social History  ? ?Tobacco Use  ? Smoking status: Former  ?  Types: Cigarettes  ?  Quit date: 07/27/1970  ?  Years since quitting: 50.7  ? Smokeless tobacco: Never  ?Substance Use Topics  ? Alcohol use: Yes  ?  Alcohol/week: 10.0  standard drinks  ?  Types: 10 Glasses of wine per week  ?  Comment: 1-2 glasses of wine daily  ? Drug use: No  ? ?  ? ?  ? ?OPHTHALMIC EXAM: ? ?Base Eye Exam   ? ? Visual Acuity (ETDRS)   ? ?   Right Left  ? Dist [place] 20/100 -1 20/20 -2  ? Dist ph [place] 20/60 -1   ? ?  ?  ? ? Tonometry (Tonopen, 2:27 PM)   ? ?   Right Left  ? Pressure 15   ? ?  ?  ? ? Pupils   ? ?   Pupils Dark Light React APD  ? Right PERRL 4 3 Brisk None  ? Left PERRL 4 3 Brisk None  ? ?  ?  ? ? Extraocular Movement   ? ?   Right Left  ?  Full Full  ? ?  ?  ? ? Neuro/Psych   ? ? Oriented x3: Yes  ? ?  ?  ? ? Dilation   ? ? Both eyes: No dilation @ 2:25 PM  ? ?  ?  ? ?  ? ? ?IMAGING AND PROCEDURES  ?Imaging and Procedures for @TODAY @ ? ? ? ?  ?  ? ?  ?ASSESSMENT/PLAN: ? ?No diagnosis found. ? ?Ophthalmic Meds Ordered this visit:  ?Meds ordered this encounter  ?Medications  ? prednisoLONE acetate (PRED FORTE) 1 % ophthalmic suspension  ?  Sig: Place 1 drop into the right eye 4 (four) times daily for 21 doses.  ?  Dispense:  5 mL  ?  Refill:  0  ? ofloxacin (OCUFLOX) 0.3 % ophthalmic solution  ?  Sig: Place 1 drop into the right eye in the morning, at noon, in the evening, and at bedtime for 21 doses.  ?  Dispense:  5 mL  ?  Refill:  0  ? ? ?  ? ? ?Pre-op completed. Operative consent obtained with pre-op eye drops reviewed with Scharlene Corn and sent via [facility] as needed. ?Post op instructions reviewed with patient and per patient all questions answered. ? ?Laurin Coder ? ? ? ?  ?

## 2021-04-22 ENCOUNTER — Ambulatory Visit: Payer: Medicare Other | Admitting: Podiatry

## 2021-04-22 ENCOUNTER — Encounter: Payer: Self-pay | Admitting: Podiatry

## 2021-04-22 ENCOUNTER — Ambulatory Visit (INDEPENDENT_AMBULATORY_CARE_PROVIDER_SITE_OTHER): Payer: Medicare Other

## 2021-04-22 DIAGNOSIS — I701 Atherosclerosis of renal artery: Secondary | ICD-10-CM | POA: Insufficient documentation

## 2021-04-22 DIAGNOSIS — M76821 Posterior tibial tendinitis, right leg: Secondary | ICD-10-CM

## 2021-04-22 DIAGNOSIS — H3530 Unspecified macular degeneration: Secondary | ICD-10-CM | POA: Insufficient documentation

## 2021-04-22 DIAGNOSIS — M7751 Other enthesopathy of right foot: Secondary | ICD-10-CM

## 2021-04-22 DIAGNOSIS — M778 Other enthesopathies, not elsewhere classified: Secondary | ICD-10-CM | POA: Diagnosis not present

## 2021-04-22 MED ORDER — TRIAMCINOLONE ACETONIDE 40 MG/ML IJ SUSP
20.0000 mg | Freq: Once | INTRAMUSCULAR | Status: AC
  Administered 2021-04-22: 20 mg

## 2021-04-22 NOTE — Progress Notes (Signed)
?Subjective:  ?Patient ID: Charlotte Duarte, female    DOB: 1940-10-09,  MRN: [id number] ?HPI ?Chief Complaint  ?Patient presents with  ? Ankle Pain  ?  Medial ankle right - pronation x years, recently over the last few months pain has increased, she only has one pair of shoes that are comfortable.  ? New Patient (Initial Visit)  ? ? ?81 y.o. female presents with the above complaint.  ? ?ROS: Denies fever chills nausea vomiting muscle aches pains calf pain back pain chest pain shortness of breath. ? ?Past Medical History:  ?Diagnosis Date  ? Allergy   ? Arthritis   ? Atrophic vaginitis   ? Cataract   ? 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 GERD (gastroesophageal reflux disease)   ? H/O hiatal hernia   ? Hepatitis   ? 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 Hyperlipidemia   ? Hypertension   ? Renal artery stenosis ([place])   ? SUI (stress urinary incontinence, female)   ? ?Past Surgical History:  ?Procedure Laterality Date  ? [place]  ?  both breasts implants REMOVED IN 2005  ? BILATERAL CAPSULECTOMY  2008  ? [place]  ? right  ? CATARACT EXTRACTION  20 yrs ago  ? both eyes   ? EYE SURGERY    ? KNEE ARTHROSCOPY    ? BOTH KNEES  ? RENAL STINT    ? RENAL STINT LEFT RENAL ARTERY IN 2005.  RENAL STINT REPLACE 03/2005.  ? TOTAL HIP ARTHROPLASTY  11/11/2011  ? Procedure: TOTAL HIP ARTHROPLASTY ANTERIOR APPROACH;  Surgeon: Mcarthur Rossetti, MD;  Location: [facility];  Service: Orthopedics;  Laterality: Right;  Right Total Hip Arthroplasty  ? ? ?Current Outpatient Medications:  ?  allopurinol (ZYLOPRIM) 100 MG tablet, TAKE 1 TABLET BY MOUTH  DAILY, Disp: 90 tablet, Rfl: 3 ?  aspirin EC 81 MG tablet, Take 81 mg by mouth daily., Disp: , Rfl:  ?  esomeprazole (NEXIUM) 20 MG packet, Take 20 mg by mouth daily before breakfast., Disp: , Rfl:  ?  fexofenadine (ALLEGRA) 180 MG tablet, Take 180 mg by mouth daily. Reported on 04/09/2015, Disp: , Rfl:  ?  fluticasone (VERAMYST) 27.5 MCG/SPRAY nasal  spray, , Disp: , Rfl:  ?  Multiple Vitamins-Minerals (PRESERVISION AREDS) TABS, , Disp: , Rfl:  ?  ofloxacin (OCUFLOX) 0.3 % ophthalmic solution, Place 1 drop into the right eye in the morning, at noon, in the evening, and at bedtime for 21 doses., Disp: 5 mL, Rfl: 0 ?  omeprazole (PRILOSEC) 20 MG capsule, , Disp: , Rfl:  ?  prednisoLONE acetate (PRED FORTE) 1 % ophthalmic suspension, Place 1 drop into the right eye 4 (four) times daily for 21 doses., Disp: 5 mL, Rfl: 0 ?  rosuvastatin (CRESTOR) 5 MG tablet, TAKE 1 TABLET BY MOUTH  DAILY, Disp: 90 tablet, Rfl: 3 ?  triamterene-hydrochlorothiazide (MAXZIDE) 75-50 MG tablet, TAKE 1 TABLET BY MOUTH  DAILY WITH BREAKFAST, Disp: 90 tablet, Rfl: 3 ? ?Allergies  ?Allergen Reactions  ? Amoxicillin Anaphylaxis  ? Keflex [Cephalexin] Hives and Itching  ?  "Uncontrollable itching" ?  ? Augmentin [Amoxicillin-Pot Clavulanate] Nausea And Vomiting  ?  Has patient had a PCN reaction causing immediate rash, facial/tongue/throat swelling, SOB or lightheadedness with hypotension: No ?Has patient had a PCN reaction causing severe rash involving mucus membranes or skin necrosis: No ?Has patient had a PCN reaction that required hospitalization: No ?Has patient had a PCN  reaction occurring within the last 10 years: No ?If all of the above answers are "NO", then may proceed with Cephalosporin use. ?  ? Oseltamivir Nausea Only and Nausea And Vomiting  ? Tamiflu [Oseltamivir Phosphate] Nausea And Vomiting  ? ?Review of Systems ?Objective:  ?There were no vitals filed for this visit. ? ?General: Well developed, nourished, in no acute distress, alert and oriented x3  ? ?Dermatological: Skin is warm, dry and supple bilateral. Nails x 10 are well maintained; remaining integument appears unremarkable at this time. There are no open sores, no preulcerative lesions, no rash or signs of infection present. ? ?Vascular: Dorsalis Pedis artery and Posterior Tibial artery pedal pulses are 2/4 bilateral  with immedate capillary fill time. Pedal hair growth present. No varicosities and no lower extremity edema present bilateral.  ? ?Neruologic: Grossly intact via light touch bilateral. Vibratory intact via tuning fork bilateral. Protective threshold with Semmes Wienstein monofilament intact to all pedal sites bilateral. Patellar and Achilles deep tendon reflexes 2+ bilateral. No Babinski or clonus noted bilateral.  ? ?Musculoskeletal: No gross boney pedal deformities bilateral. No pain, crepitus, or limitation noted with foot and ankle range of motion bilateral. Muscular strength 5/5 in all groups tested bilateral.  Pes planovalgus of the right foot with pain on palpation and mild erythema of the posterior tibial tendon she has good inversion against resistance but is painful. ? ?Gait: Unassisted, Nonantalgic.  ? ? ?Radiographs: ? ?Radiographs taken today demonstrate an osseously mature individual the ankle views demonstrate significant loss of the medial malleolus and fragmentation distally more than likely due to an old deltoid injury.  She also has some bone to bone contact along the medial gutter.  She does have a talar tilt as well. ? ?Assessment & Plan:  ? ?Assessment: Posterior tibial tendinitis.  Pes planovalgus.  Osteoarthritis of the right ankle. ? ?Plan: Discussed etiology pathology conservative versus surgical therapies.  Provided her an injection of dexamethasone local anesthetic near the posterior tibial tendon making sure not to inject into the tendon itself.  We discussed her orthotics that she has at home when she is going start wearing and I will follow-up with her in about 1 month to see if we need anything else.  She is will have eye surgery since we did not start her on a steroid. ? ? ? ? ?Nathali Vent T. [place], DPM ?

## 2021-04-28 DIAGNOSIS — H43311 Vitreous membranes and strands, right eye: Secondary | ICD-10-CM | POA: Diagnosis not present

## 2021-04-28 DIAGNOSIS — H43821 Vitreomacular adhesion, right eye: Secondary | ICD-10-CM | POA: Diagnosis not present

## 2021-04-29 ENCOUNTER — Encounter (INDEPENDENT_AMBULATORY_CARE_PROVIDER_SITE_OTHER): Payer: Self-pay | Admitting: Ophthalmology

## 2021-04-29 ENCOUNTER — Other Ambulatory Visit: Payer: Self-pay

## 2021-04-29 ENCOUNTER — Ambulatory Visit (INDEPENDENT_AMBULATORY_CARE_PROVIDER_SITE_OTHER): Payer: Medicare Other | Admitting: Ophthalmology

## 2021-04-29 DIAGNOSIS — H43821 Vitreomacular adhesion, right eye: Secondary | ICD-10-CM

## 2021-04-29 NOTE — Patient Instructions (Signed)
No lifting and bending for 1 week. No water IN the eye for 10 days. Do not rub the eye. Wear shield at night for 1-3 days.  Continue your topical medications for a total of 3 weeks.  Do not refill your postoperative medications unless instructed.  Refrain from exercise or intentional activity which increases our heart rate above resting levels.  Normal walking to complete normal activities of your day are appropriate.  Driving:  Legally, you only need one good eye, of 20/40 or better to drive.  However, the practice does not recommend driving during first weeks after surgery, IF you are uncomfortable with your visual functioning or capabilities.   If you have known sleep apnea, wear your CPAP as you normally should.   Ofloxacin  4 times daily to the operative eye  Prednisolone acetate 1 drop to the operative eye 4 times daily  Patient instructed not to refill the medications and use them for maximum of 3 weeks.  Patient instructed do not rub the eye.  Patient has the option to use the patch at night.  

## 2021-04-29 NOTE — Progress Notes (Signed)
04/29/2021     CHIEF COMPLAINT Patient presents for No chief complaint on file.     HISTORY OF PRESENT ILLNESS: Charlotte Duarte is a 81 y.o. female who presents to the clinic today for:     Referring physician: Elby Showers, MD [address],  [place] [address]  HISTORICAL INFORMATION:   Selected notes from the [facility]    Lab Results  Component Value Date   HGBA1C 5.9 (H) 03/12/2020     CURRENT MEDICATIONS: No current outpatient medications on file. (Ophthalmic Drugs)   No current facility-administered medications for this visit. (Ophthalmic Drugs)   Current Outpatient Medications (Other)  Medication Sig   allopurinol (ZYLOPRIM) 100 MG tablet TAKE 1 TABLET BY MOUTH  DAILY   aspirin EC 81 MG tablet Take 81 mg by mouth daily.   esomeprazole (NEXIUM) 20 MG packet Take 20 mg by mouth daily before breakfast.   fexofenadine (ALLEGRA) 180 MG tablet Take 180 mg by mouth daily. Reported on 04/09/2015   fluticasone (VERAMYST) 27.5 MCG/SPRAY nasal spray    Multiple Vitamins-Minerals (PRESERVISION AREDS) TABS    omeprazole (PRILOSEC) 20 MG capsule    rosuvastatin (CRESTOR) 5 MG tablet TAKE 1 TABLET BY MOUTH  DAILY   triamterene-hydrochlorothiazide (MAXZIDE) 75-50 MG tablet TAKE 1 TABLET BY MOUTH  DAILY WITH BREAKFAST   No current facility-administered medications for this visit. (Other)      REVIEW OF SYSTEMS:    ALLERGIES Allergies  Allergen Reactions   Amoxicillin Anaphylaxis   Keflex [Cephalexin] Hives and Itching    "Uncontrollable itching"    Augmentin [Amoxicillin-Pot Clavulanate] Nausea And Vomiting    Has patient had a PCN reaction causing immediate rash, facial/tongue/throat swelling, SOB or lightheadedness with hypotension: No Has patient had a PCN reaction causing severe rash involving mucus membranes or skin necrosis: No Has patient had a PCN reaction that required hospitalization: No Has patient had a PCN reaction occurring  within the last 10 years: No If all of the above answers are "NO", then may proceed with Cephalosporin use.    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  RENAL STINT REPLACE 03/2005.   TOTAL HIP ARTHROPLASTY  11/11/2011   Procedure: TOTAL HIP ARTHROPLASTY ANTERIOR APPROACH;  Surgeon: Mcarthur Rossetti, MD;  Location: [facility];  Service: Orthopedics;  Laterality: Right;  Right Total Hip Arthroplasty    FAMILY HISTORY Family History  Problem Relation Age of Onset   Mental illness Mother  Breast cancer Mother 82   Hypertension Mother    Heart disease Father    ALS Brother    Stroke Brother     SOCIAL HISTORY Social History   Tobacco Use   Smoking status: Former    Types: Cigarettes    Quit date: 07/27/1970    Years since quitting: 50.7   Smokeless tobacco: Never  Substance Use Topics   Alcohol use: Yes    Alcohol/week: 10.0 standard drinks    Types: 10 Glasses of wine per week    Comment: 1-2 glasses of wine daily   Drug use: No         OPHTHALMIC EXAM:  Base Eye Exam     Visual Acuity (ETDRS)       Right Left   Dist [place] 20/100    Dist ph [place] 20/50 -2           Tonometry (Tonopen, 8:45 AM)       Right Left   Pressure 15          Neuro/Psych     Oriented x3: Yes   Mood/Affect: Normal           Slit Lamp and Fundus Exam     External Exam       Right Left   External Normal Normal         Slit Lamp Exam       Right Left   Lids/Lashes Normal Normal   Conjunctiva/Sclera 2+ Injection White and quiet   Cornea Clear Clear   Anterior Chamber Deep and quiet Deep and quiet   Iris Pharmacologically dilated Round and reactive   Lens Centered posterior chamber intraocular lens, Open posterior capsule Centered posterior chamber intraocular lens, 1+ Posterior capsular opacification   Anterior Vitreous Clear Normal         Fundus Exam       Right Left   Posterior Vitreous Clear, avitric    Disc Normal    C/D Ratio 0.2    Macula Subfoveal drusenoid deposit pseudovitelliform like, approximately 600 m in diameter, no adjacent fluid, pigmentary changes well, adult vitelliform macular dystrophy appearance    Vessels Normal    Periphery Normal             IMAGING AND PROCEDURES  Imaging and Procedures for 04/29/21           ASSESSMENT/PLAN:  Vitreomacular adhesion of right eye Postop day #1 looks great, visual acuity stable, with pharmacologically dilated pupil.  Patient to resume topical medications today     ICD-10-CM   1. Vitreomacular adhesion of right eye  H43.821       1.  OD looks great, postop day #1  2.  3.  Ophthalmic Meds Ordered this visit:  No orders of the defined types were placed in this encounter.      Return in about 1 week (around 05/06/2021) for POST OP, OD, OCT.  Patient Instructions  No lifting and bending for 1 week. No water IN the eye for 10 days. Do not rub the eye. Wear shield at night for 1-3 days.  Continue your topical medications for a total of 3 weeks.  Do not refill your postoperative medications unless instructed.  Refrain from exercise or intentional activity which  increases our heart rate above resting levels.  Normal walking to complete normal activities of your day are appropriate.  Driving:  Legally, you only need one good eye, of 20/40 or better to drive.  However,  the practice does not recommend driving during first weeks after surgery, IF you are uncomfortable with your visual functioning or capabilities.   If you have known sleep apnea, wear your CPAP as you normally should.     Ofloxacin  4 times daily to the operative eye  Prednisolone acetate 1 drop to the operative eye 4 times daily  Patient instructed not to refill the medications and use them for maximum of 3 weeks.  Patient instructed do not rub the eye.  Patient has the option to use the patch at night.    Explained the diagnoses, plan, and follow up with the patient and they expressed understanding.  Patient expressed understanding of the importance of proper follow up care.   Clent Demark Natnael Biederman M.D. Diseases & Surgery of the Retina and Vitreous [facility] [place] 04/29/21     Abbreviations: M myopia (nearsighted); A astigmatism; H hyperopia (farsighted); P presbyopia; Mrx spectacle prescription;  CTL contact lenses; OD right eye; OS left eye; OU both eyes  XT exotropia; ET esotropia; PEK punctate epithelial keratitis; PEE punctate epithelial erosions; DES dry eye syndrome; MGD meibomian gland dysfunction; ATs artificial tears; PFAT's preservative free artificial tears; [place] nuclear sclerotic cataract; PSC posterior subcapsular cataract; ERM epi-retinal membrane; PVD posterior vitreous detachment; RD retinal detachment; DM diabetes mellitus; DR diabetic retinopathy; NPDR non-proliferative diabetic retinopathy; PDR proliferative diabetic retinopathy; CSME clinically significant macular edema; DME diabetic macular edema; dbh dot blot hemorrhages; CWS cotton wool spot; POAG primary open angle glaucoma; C/D cup-to-disc ratio; HVF humphrey visual field; GVF goldmann visual field;  OCT optical coherence tomography; IOP intraocular pressure; BRVO Branch retinal vein occlusion; CRVO central retinal vein occlusion; CRAO central retinal artery occlusion; BRAO branch retinal artery occlusion; RT retinal tear; SB scleral buckle; PPV pars plana vitrectomy; VH Vitreous hemorrhage; PRP panretinal laser photocoagulation; IVK intravitreal kenalog; VMT vitreomacular traction; MH Macular hole;  NVD neovascularization of the disc; NVE neovascularization elsewhere; AREDS age related eye disease study; ARMD age related macular degeneration; POAG primary open angle glaucoma; EBMD epithelial/anterior basement membrane dystrophy; ACIOL anterior chamber intraocular lens; IOL intraocular lens; PCIOL posterior chamber intraocular lens; Phaco/IOL phacoemulsification with intraocular lens placement; [place] photorefractive keratectomy; LASIK laser assisted in situ keratomileusis; HTN hypertension; DM diabetes mellitus; COPD chronic obstructive pulmonary disease

## 2021-04-29 NOTE — Assessment & Plan Note (Signed)
Postop day #1 looks great, visual acuity stable, with pharmacologically dilated pupil. ? ?Patient to resume topical medications today ?

## 2021-04-30 ENCOUNTER — Encounter (AMBULATORY_SURGERY_CENTER): Payer: Medicare Other | Admitting: Ophthalmology

## 2021-05-04 ENCOUNTER — Other Ambulatory Visit: Payer: Self-pay

## 2021-05-04 ENCOUNTER — Telehealth: Payer: Self-pay | Admitting: Internal Medicine

## 2021-05-04 ENCOUNTER — Encounter: Payer: Self-pay | Admitting: Internal Medicine

## 2021-05-04 ENCOUNTER — Ambulatory Visit (INDEPENDENT_AMBULATORY_CARE_PROVIDER_SITE_OTHER): Payer: Medicare Other | Admitting: Internal Medicine

## 2021-05-04 ENCOUNTER — Ambulatory Visit
Admission: RE | Admit: 2021-05-04 | Discharge: 2021-05-04 | Disposition: A | Discharge status: Home / Self Care | Payer: Medicare Other | Source: Ambulatory Visit | Attending: Internal Medicine | Admitting: Internal Medicine

## 2021-05-04 VITALS — BP 122/80 | HR 83 | Temp 98.3°F

## 2021-05-04 DIAGNOSIS — H43821 Vitreomacular adhesion, right eye: Secondary | ICD-10-CM

## 2021-05-04 DIAGNOSIS — J9801 Acute bronchospasm: Secondary | ICD-10-CM

## 2021-05-04 DIAGNOSIS — J22 Unspecified acute lower respiratory infection: Secondary | ICD-10-CM | POA: Diagnosis not present

## 2021-05-04 DIAGNOSIS — N1831 Chronic kidney disease, stage 3a: Secondary | ICD-10-CM

## 2021-05-04 DIAGNOSIS — Z8679 Personal history of other diseases of the circulatory system: Secondary | ICD-10-CM | POA: Diagnosis not present

## 2021-05-04 DIAGNOSIS — H6691 Otitis media, unspecified, right ear: Secondary | ICD-10-CM | POA: Diagnosis not present

## 2021-05-04 DIAGNOSIS — I10 Essential (primary) hypertension: Secondary | ICD-10-CM

## 2021-05-04 DIAGNOSIS — R7302 Impaired glucose tolerance (oral): Secondary | ICD-10-CM

## 2021-05-04 DIAGNOSIS — R062 Wheezing: Secondary | ICD-10-CM

## 2021-05-04 DIAGNOSIS — R0602 Shortness of breath: Secondary | ICD-10-CM | POA: Diagnosis not present

## 2021-05-04 MED ORDER — PREDNISONE 10 MG PO TABS: ORAL_TABLET | ORAL | 0 refills | Status: DC

## 2021-05-04 MED ORDER — AZITHROMYCIN 250 MG PO TABS: ORAL_TABLET | ORAL | 0 refills | Status: AC

## 2021-05-04 MED ORDER — ALBUTEROL SULFATE (2.5 MG/3ML) 0.083% IN NEBU
2.5000 mg | INHALATION_SOLUTION | Freq: Once | RESPIRATORY_TRACT | Status: AC
  Administered 2021-05-04: 2.5 mg via RESPIRATORY_TRACT

## 2021-05-04 NOTE — Telephone Encounter (Signed)
Darris Carachure ?[phone] ? ?Pricilla Holm called to say she has a cough for 4-5 days, some mucus and wheezing. She done a COVID test several days ago that was negative. I have ask her to do another one and call me back.  ?

## 2021-05-04 NOTE — Telephone Encounter (Signed)
COVID test was Negative, scheduled for office visit at 2:00 ?

## 2021-05-04 NOTE — Progress Notes (Signed)
SUBJECTIVE:  ?Charlotte Duarte is a 81 y.o. Female who complains of dry cough and wheezing onset on Friday March 10. No known Covid-19 exposure. for 4-5  days. She denies a history of chest pain, dizziness, fatigue, and fevers and denies a history of asthma. Patient does not smoke cigarettes. ? ?She is followed by Nephrology for chronic kidney disease stage III, hypertension, renal artery stenosis status post left renal artery PTCA and stenting in 2005 with PCI of an in-stent stenosis in 2007.  History of hyperlipidemia and GE reflux.  History of allergic rhinitis.  Arthroscopic surgery of both knees in 1990 in 1995 by Dr. Noemi Chapel.  Breast augmentation in 1980 and had implant removal in 2007.  History of lumbar spinal stenosis 2013.  History of right bundle branch block on EKG.  Carpal tunnel release 1993. ? ?Just had surgery on right eye by Dr. Zadie Rhine for vitreomacular adhesion last week on Wednesday, March 8.  Is recovering well. ? ?Thinks she caught this respiratory infection from her husband. ?  ?OBJECTIVE: Temp 98.3 degrees pulse 83 blood pressure 122/80 pulse oximetry 98% on room air, Respiratory rate is 12 ?She appears fatigued. Right TM pink with splayed light reflex.  Left TM clear.  Pharynx slightly injected without exudate.  Neck supple. No adenopathy in the neck.  She has inspiratory wheezing bilaterally in the lower lobes. ? ?She was given albuterol nebulizer treatment with improvement in wheezing symptoms. ? ?ASSESSMENT:  ?Acute bronchospasm ?Acute right otitis media ? ? ?PLAN:  Zithromax Z pak 2 tabs day 1 followed by one tab days 2-5.  Chest x-ray performed today and results are negative for pneumonia.  She has some cough preparation at home that she will take for cough.  She will be given a tapering course of prednisone 10 mg (# 21 tablets) starting with 6 tablets day 1 and decreasing by 1 tablet daily i.e. 6-5-4-3-2-1 taper.  She will rest and drink fluids and monitor pulse oximetry. ? ? ? ?Respiratory  virus panel obtained and results are pending ? ?I, Elby Showers, MD, have reviewed all documentation for this visit. The documentation on 05/04/21 for the exam, diagnosis, procedures, and orders are all accurate and complete. ?

## 2021-05-04 NOTE — Patient Instructions (Addendum)
Patient responded well to albuterol nebulizer treatment in office.  She will take prednisone tapering course 10 mg starting with 6 tablets day 1 and decreasing by 1 tablet daily i.e. 6-5-4-3-2-1 taper. ?Take Zithromax Z-pak as directed 2 tabs day 1 followed by one tab days 2-5. Rest and drink fluids. CXR is negative for pneumonia.  Respiratory virus panel pending. ? ?

## 2021-05-05 ENCOUNTER — Telehealth: Payer: Self-pay

## 2021-05-05 ENCOUNTER — Encounter (INDEPENDENT_AMBULATORY_CARE_PROVIDER_SITE_OTHER): Payer: Self-pay

## 2021-05-05 ENCOUNTER — Encounter (INDEPENDENT_AMBULATORY_CARE_PROVIDER_SITE_OTHER): Payer: Medicare Other | Admitting: Ophthalmology

## 2021-05-05 NOTE — Telephone Encounter (Signed)
Patient called and said she was still wheezing and coughing quite a bit. She wanted to know how often to do it. She was advised every 6 hours as needed. She also wanted to know if she was contagious. I explained we didn't know what exactly she had and the RSP panel isnt back yet but she should definitely shouldn't be out and about being exposed to more germs at this time.  ?

## 2021-05-06 LAB — RESPIRATORY VIRUS PANEL
Adenovirus B: NOT DETECTED
HUMAN PARAINFLU VIRUS 1: NOT DETECTED
HUMAN PARAINFLU VIRUS 2: NOT DETECTED
HUMAN PARAINFLU VIRUS 3: NOT DETECTED
INFLUENZA A SUBTYPE H1: NOT DETECTED
INFLUENZA A SUBTYPE H3: NOT DETECTED
Influenza A: NOT DETECTED
Influenza B: NOT DETECTED
Metapneumovirus: DETECTED — AB
Respiratory Syncytial Virus A: NOT DETECTED
Respiratory Syncytial Virus B: NOT DETECTED
Rhinovirus: NOT DETECTED

## 2021-05-07 ENCOUNTER — Encounter: Payer: Self-pay | Admitting: Internal Medicine

## 2021-05-11 DIAGNOSIS — L570 Actinic keratosis: Secondary | ICD-10-CM | POA: Diagnosis not present

## 2021-05-11 DIAGNOSIS — I8392 Asymptomatic varicose veins of left lower extremity: Secondary | ICD-10-CM | POA: Diagnosis not present

## 2021-05-11 DIAGNOSIS — L821 Other seborrheic keratosis: Secondary | ICD-10-CM | POA: Diagnosis not present

## 2021-05-11 DIAGNOSIS — L578 Other skin changes due to chronic exposure to nonionizing radiation: Secondary | ICD-10-CM | POA: Diagnosis not present

## 2021-05-11 DIAGNOSIS — D2272 Melanocytic nevi of left lower limb, including hip: Secondary | ICD-10-CM | POA: Diagnosis not present

## 2021-05-11 DIAGNOSIS — Z85828 Personal history of other malignant neoplasm of skin: Secondary | ICD-10-CM | POA: Diagnosis not present

## 2021-05-12 ENCOUNTER — Ambulatory Visit (INDEPENDENT_AMBULATORY_CARE_PROVIDER_SITE_OTHER): Payer: Medicare Other | Admitting: Ophthalmology

## 2021-05-12 ENCOUNTER — Encounter (INDEPENDENT_AMBULATORY_CARE_PROVIDER_SITE_OTHER): Payer: Self-pay | Admitting: Ophthalmology

## 2021-05-12 ENCOUNTER — Other Ambulatory Visit: Payer: Self-pay

## 2021-05-12 DIAGNOSIS — H353112 Nonexudative age-related macular degeneration, right eye, intermediate dry stage: Secondary | ICD-10-CM

## 2021-05-12 DIAGNOSIS — H43821 Vitreomacular adhesion, right eye: Secondary | ICD-10-CM | POA: Diagnosis not present

## 2021-05-12 NOTE — Assessment & Plan Note (Signed)
Some 2 weeks post vitrectomy, improved macular findings by OCT as well as symptomatically and by objective acuity taking today ?

## 2021-05-12 NOTE — Patient Instructions (Signed)
Ofloxacin  4 times daily to the operative eye  Prednisolone acetate 1 drop to the operative eye 4 times daily  Patient instructed not to refill the medications and use them for maximum of 3 weeks.  Patient instructed do not rub the eye.  Patient has the option to use the patch at night. 

## 2021-05-12 NOTE — Assessment & Plan Note (Signed)
Drusenoid appearance has improved slightly some 1 week post vitrectomy for VMA ?

## 2021-05-12 NOTE — Progress Notes (Signed)
? ? ?05/12/2021 ? ?  ? ?CHIEF COMPLAINT ?Patient presents for  ?Chief Complaint  ?Patient presents with  ? Post-op Follow-up  ? ? ? ? ?HISTORY OF PRESENT ILLNESS: ?Charlotte Duarte is a 81 y.o. female who presents to the clinic today for:  ? ?HPI   ? ? Post-op Follow-up   ? ?      ? Laterality: right eye  ? Discomfort: Negative for pain, itching, foreign body sensation, tearing, discharge, floaters and none  ? Vision: is stable  ? ?  ?  ? ? Comments   ?1 week for post op, OD, OCT. ?Pt did not use eye drops this morning but has used them every other day.  ?Pt would like to know if she can use the allergy eye drops. ?Pt denies floaters and FOL. ?Pt stated she tested the amsler grid and said there has been improvements. ?Pt stated that vision in the right eye does appear clearer now ? ? ? ? ? ?  ?  ?Last edited by Hurman Horn, MD on 05/12/2021  9:38 AM.  ?  ? ? ?Referring physician: ?Elby Showers, MD ?[address] ?[place],  [place] [address] ? ?HISTORICAL INFORMATION:  ? ?Selected notes from the [facility] ?  ? ?Lab Results  ?Component Value Date  ? HGBA1C 5.9 (H) 03/12/2020  ?  ? ?CURRENT MEDICATIONS: ?No current outpatient medications on file. (Ophthalmic Drugs)  ? ?No current facility-administered medications for this visit. (Ophthalmic Drugs)  ? ?Current Outpatient Medications (Other)  ?Medication Sig  ? allopurinol (ZYLOPRIM) 100 MG tablet TAKE 1 TABLET BY MOUTH  DAILY  ? aspirin EC 81 MG tablet Take 81 mg by mouth daily.  ? esomeprazole (NEXIUM) 20 MG packet Take 20 mg by mouth daily before breakfast.  ? fexofenadine (ALLEGRA) 180 MG tablet Take 180 mg by mouth daily. Reported on 04/09/2015  ? fluticasone (VERAMYST) 27.5 MCG/SPRAY nasal spray   ? Multiple Vitamins-Minerals (PRESERVISION AREDS) TABS   ? predniSONE (DELTASONE) 10 MG tablet Take in tapering course as directed 6-5-4-3-2-1  ? rosuvastatin (CRESTOR) 5 MG tablet TAKE 1 TABLET BY MOUTH  DAILY  ? triamterene-hydrochlorothiazide (MAXZIDE) 75-50  MG tablet TAKE 1 TABLET BY MOUTH  DAILY WITH BREAKFAST  ? ?No current facility-administered medications for this visit. (Other)  ? ? ? ? ?REVIEW OF SYSTEMS: ?ROS   ?Negative for: Constitutional, Gastrointestinal, Neurological, Skin, Genitourinary, Musculoskeletal, HENT, Endocrine, Cardiovascular, Eyes, Respiratory, Psychiatric, Allergic/Imm, Heme/Lymph ?Last edited by Silvestre Moment on 05/12/2021  8:43 AM.  ?  ? ? ? ?ALLERGIES ?Allergies  ?Allergen Reactions  ? Amoxicillin Anaphylaxis  ? Keflex [Cephalexin] Hives and Itching  ?  "Uncontrollable itching" ?  ? Augmentin [Amoxicillin-Pot Clavulanate] Nausea And Vomiting  ?  Has patient had a PCN reaction causing immediate rash, facial/tongue/throat swelling, SOB or lightheadedness with hypotension: No ?Has patient had a PCN reaction causing severe rash involving mucus membranes or skin necrosis: No ?Has patient had a PCN reaction that required hospitalization: No ?Has patient had a PCN reaction occurring within the last 10 years: No ?If all of the above answers are "NO", then may proceed with Cephalosporin use. ?  ? Oseltamivir Nausea Only and Nausea And Vomiting  ? Tamiflu [Oseltamivir Phosphate] Nausea And Vomiting  ? ? ?PAST MEDICAL HISTORY ?Past Medical History:  ?Diagnosis Date  ? Allergy   ? Arthritis   ? Atrophic vaginitis   ? Cataract   ? 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 GERD (gastroesophageal  reflux disease)   ? H/O hiatal hernia   ? Hepatitis   ? 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 Hyperlipidemia   ? Hypertension   ? Renal artery stenosis ([place])   ? SUI (stress urinary incontinence, female)   ? ?Past Surgical History:  ?Procedure Laterality Date  ? [place]  ?  both breasts implants REMOVED IN 2005  ? BILATERAL CAPSULECTOMY  2008  ? [place]  ? right  ? CATARACT EXTRACTION  20 yrs ago  ? both eyes   ? EYE SURGERY    ? KNEE ARTHROSCOPY    ? BOTH KNEES  ? RENAL STINT    ? RENAL STINT LEFT RENAL ARTERY IN 2005.  RENAL  STINT REPLACE 03/2005.  ? TOTAL HIP ARTHROPLASTY  11/11/2011  ? Procedure: TOTAL HIP ARTHROPLASTY ANTERIOR APPROACH;  Surgeon: Mcarthur Rossetti, MD;  Location: [facility];  Service: Orthopedics;  Laterality: Right;  Right Total Hip Arthroplasty  ? ? ?FAMILY HISTORY ?Family History  ?Problem Relation Age of Onset  ? Mental illness Mother   ? Breast cancer Mother 27  ? Hypertension Mother   ? Heart disease Father   ? ALS Brother   ? Stroke Brother   ? ? ?SOCIAL HISTORY ?Social History  ? ?Tobacco Use  ? Smoking status: Former  ?  Types: Cigarettes  ?  Quit date: 07/27/1970  ?  Years since quitting: 50.8  ? Smokeless tobacco: Never  ?Substance Use Topics  ? Alcohol use: Yes  ?  Alcohol/week: 10.0 standard drinks  ?  Types: 10 Glasses of wine per week  ?  Comment: 1-2 glasses of wine daily  ? Drug use: No  ? ?  ? ?  ? ?OPHTHALMIC EXAM: ? ?Base Eye Exam   ? ? Visual Acuity (ETDRS)   ? ?   Right Left  ? Dist [place] 20/60 20/20 -1  ? Dist ph [place] 20/40 -2   ? ?  ?  ? ? Tonometry (Tonopen, 8:52 AM)   ? ?   Right Left  ? Pressure 15 11  ? ?  ?  ? ? Pupils   ? ?   Pupils Dark Light Shape React APD  ? Right PERRL 3 2 Round Brisk None  ? Left PERRL 3 2 Round Brisk None  ? ?  ?  ? ? Visual Fields   ? ?   Left Right  ?  Full Full  ? ?  ?  ? ? Extraocular Movement   ? ?   Right Left  ?  Full Full  ? ?  ?  ? ? Neuro/Psych   ? ? Oriented x3: Yes  ? Mood/Affect: Normal  ? ?  ?  ? ? Dilation   ? ? Right eye: 1.0% Mydriacyl, 2.5% Phenylephrine @ 8:52 AM  ? ?  ?  ? ?  ? ?Slit Lamp and Fundus Exam   ? ? External Exam   ? ?   Right Left  ? External Normal Normal  ? ?  ?  ? ? Slit Lamp Exam   ? ?   Right Left  ? Lids/Lashes Normal Normal  ? Conjunctiva/Sclera 2+ Injection White and quiet  ? Cornea Clear Clear  ? Anterior Chamber Deep and quiet Deep and quiet  ? Iris Pharmacologically dilated Round and reactive  ? Lens Centered posterior chamber intraocular lens, Open posterior capsule Centered posterior chamber intraocular lens, 1+ Posterior  capsular opacification  ?  Anterior Vitreous Clear Normal  ? ?  ?  ? ? Fundus Exam   ? ?   Right Left  ? Posterior Vitreous Clear, avitric   ? Disc Normal   ? C/D Ratio 0.2   ? Macula Subfoveal drusenoid deposit pseudovitelliform like, approximately 600 ?m in diameter, no adjacent fluid, pigmentary changes well, adult vitelliform macular dystrophy appearance   ? Vessels Normal   ? Periphery Normal   ? ?  ?  ? ?  ? ? ?IMAGING AND PROCEDURES  ?Imaging and Procedures for 05/12/21 ? ?OCT, Retina - OU - Both Eyes   ? ?   ?Right Eye ?Quality was good. Scan locations included subfoveal. Central Foveal Thickness: 329. Progression has improved. Findings include no SRF, no IRF, retinal drusen , subretinal hyper-reflective material, pigment epithelial detachment.  ? ?Left Eye ?Quality was good. Scan locations included subfoveal. Central Foveal Thickness: 266. Progression has no prior data. Findings include normal foveal contour, no SRF, no IRF, retinal drusen .  ? ?Notes ?Subfoveal drusenoid type deposit of adult vitelliform macular dystrophy right eye.  No overt vitreomacular adhesion Yet suspicious for small intraretinal foveal traction which if traction is present on the fovea, could lead to this type of drusenoid appearance.  Still no definable PVD OD with vertical height now improved in the right eye ?To 213 microns from previous 229 microns ?OS incidental perifoveal operculum, no hole.  Diffuse punctate hard drusen at the level of Bruch's membrane ? ?  ? ? ?  ?  ? ?  ?ASSESSMENT/PLAN: ? ?Intermediate stage nonexudative age-related macular degeneration of right eye ?Drusenoid appearance has improved slightly some 1 week post vitrectomy for VMA ? ?Vitreomacular adhesion of right eye ?Some 2 weeks post vitrectomy, improved macular findings by OCT as well as symptomatically and by objective acuity taking today  ? ?  ICD-10-CM   ?1. Vitreomacular adhesion of right eye  H43.821 OCT, Retina - OU - Both Eyes  ?  ?2. Intermediate  stage nonexudative age-related macular degeneration of right eye  H35.3112   ?  ? ? ?1.  Right eye looks great today with improved acuity, smaller subfoveal drusenoid deposit.  I reassured the patient that w

## 2021-05-21 ENCOUNTER — Other Ambulatory Visit: Payer: Medicare Other

## 2021-05-21 DIAGNOSIS — R5383 Other fatigue: Secondary | ICD-10-CM | POA: Diagnosis not present

## 2021-05-21 DIAGNOSIS — R7302 Impaired glucose tolerance (oral): Secondary | ICD-10-CM | POA: Diagnosis not present

## 2021-05-21 DIAGNOSIS — E7800 Pure hypercholesterolemia, unspecified: Secondary | ICD-10-CM | POA: Diagnosis not present

## 2021-05-21 DIAGNOSIS — I10 Essential (primary) hypertension: Secondary | ICD-10-CM

## 2021-05-22 LAB — LIPID PANEL
Cholesterol: 175 mg/dL (ref <200)
HDL: 54 mg/dL (ref >=50)
LDL Cholesterol (Calc): 101 mg/dL (calc) — ABNORMAL HIGH
Non-HDL Cholesterol (Calc): 121 mg/dL (calc) (ref <130)
Total CHOL/HDL Ratio: 3.2 (calc) (ref <5.0)
Triglycerides: 102 mg/dL (ref <150)

## 2021-05-22 LAB — CBC WITH DIFFERENTIAL/PLATELET
Absolute Monocytes: 508 cells/uL (ref 200–950)
Basophils Absolute: 92 cells/uL (ref 0–200)
Basophils Relative: 1.4 %
Eosinophils Absolute: 297 cells/uL (ref 15–500)
Eosinophils Relative: 4.5 %
HCT: 42.6 % (ref 35.0–45.0)
Hemoglobin: 14 g/dL (ref 11.7–15.5)
Lymphs Abs: 1155 cells/uL (ref 850–3900)
MCH: 28.5 pg (ref 27.0–33.0)
MCHC: 32.9 g/dL (ref 32.0–36.0)
MCV: 86.8 fL (ref 80.0–100.0)
MPV: 10.2 fL (ref 7.5–12.5)
Monocytes Relative: 7.7 %
Neutro Abs: 4547 cells/uL (ref 1500–7800)
Neutrophils Relative %: 68.9 %
Platelets: 341 10*3/uL (ref 140–400)
RBC: 4.91 10*6/uL (ref 3.80–5.10)
RDW: 14.3 % (ref 11.0–15.0)
Total Lymphocyte: 17.5 %
WBC: 6.6 10*3/uL (ref 3.8–10.8)

## 2021-05-22 LAB — COMPLETE METABOLIC PANEL WITH GFR
AG Ratio: 1.6 (calc) (ref 1.0–2.5)
ALT: 20 U/L (ref 6–29)
AST: 25 U/L (ref 10–35)
Albumin: 4.2 g/dL (ref 3.6–5.1)
Alkaline phosphatase (APISO): 92 U/L (ref 37–153)
BUN/Creatinine Ratio: 18 (calc) (ref 6–22)
BUN: 23 mg/dL (ref 7–25)
CO2: 28 mmol/L (ref 20–32)
Calcium: 10.3 mg/dL (ref 8.6–10.4)
Chloride: 101 mmol/L (ref 98–110)
Creat: 1.29 mg/dL — ABNORMAL HIGH (ref 0.60–1.00)
Globulin: 2.7 g/dL (calc) (ref 1.9–3.7)
Glucose, Bld: 85 mg/dL (ref 65–99)
Potassium: 4 mmol/L (ref 3.5–5.3)
Sodium: 142 mmol/L (ref 135–146)
Total Bilirubin: 0.6 mg/dL (ref 0.2–1.2)
Total Protein: 6.9 g/dL (ref 6.1–8.1)
eGFR: 42 mL/min/{1.73_m2} — ABNORMAL LOW (ref >=60)

## 2021-05-22 LAB — HEMOGLOBIN A1C
Hgb A1c MFr Bld: 6.2 % of total Hgb — ABNORMAL HIGH (ref <5.7)
Mean Plasma Glucose: 131 mg/dL
eAG (mmol/L): 7.3 mmol/L

## 2021-05-22 LAB — TSH: TSH: 2.52 mIU/L (ref 0.40–4.50)

## 2021-05-25 ENCOUNTER — Ambulatory Visit (INDEPENDENT_AMBULATORY_CARE_PROVIDER_SITE_OTHER): Payer: Medicare Other | Admitting: Internal Medicine

## 2021-05-25 ENCOUNTER — Encounter: Payer: Self-pay | Admitting: Internal Medicine

## 2021-05-25 VITALS — BP 139/89 | HR 88 | Temp 97.8°F | Wt 151.4 lb

## 2021-05-25 DIAGNOSIS — Z803 Family history of malignant neoplasm of breast: Secondary | ICD-10-CM | POA: Diagnosis not present

## 2021-05-25 DIAGNOSIS — Z0000 Encounter for general adult medical examination without abnormal findings: Secondary | ICD-10-CM | POA: Diagnosis not present

## 2021-05-25 DIAGNOSIS — H43829 Vitreomacular adhesion, unspecified eye: Secondary | ICD-10-CM

## 2021-05-25 DIAGNOSIS — Z8709 Personal history of other diseases of the respiratory system: Secondary | ICD-10-CM

## 2021-05-25 DIAGNOSIS — E7800 Pure hypercholesterolemia, unspecified: Secondary | ICD-10-CM

## 2021-05-25 DIAGNOSIS — R8290 Unspecified abnormal findings in urine: Secondary | ICD-10-CM | POA: Diagnosis not present

## 2021-05-25 DIAGNOSIS — N1831 Chronic kidney disease, stage 3a: Secondary | ICD-10-CM | POA: Diagnosis not present

## 2021-05-25 DIAGNOSIS — I10 Essential (primary) hypertension: Secondary | ICD-10-CM

## 2021-05-25 DIAGNOSIS — R7302 Impaired glucose tolerance (oral): Secondary | ICD-10-CM

## 2021-05-25 DIAGNOSIS — H43821 Vitreomacular adhesion, right eye: Secondary | ICD-10-CM

## 2021-05-25 DIAGNOSIS — Z8719 Personal history of other diseases of the digestive system: Secondary | ICD-10-CM | POA: Diagnosis not present

## 2021-05-25 DIAGNOSIS — M48061 Spinal stenosis, lumbar region without neurogenic claudication: Secondary | ICD-10-CM | POA: Diagnosis not present

## 2021-05-25 DIAGNOSIS — I4510 Unspecified right bundle-branch block: Secondary | ICD-10-CM

## 2021-05-25 DIAGNOSIS — Z8679 Personal history of other diseases of the circulatory system: Secondary | ICD-10-CM | POA: Diagnosis not present

## 2021-05-25 DIAGNOSIS — Z96641 Presence of right artificial hip joint: Secondary | ICD-10-CM | POA: Diagnosis not present

## 2021-05-25 LAB — POCT URINALYSIS DIPSTICK
Blood, UA: NEGATIVE
Blood, UA: NEGATIVE
Glucose, UA: NEGATIVE
Glucose, UA: NEGATIVE
Ketones, UA: NEGATIVE
Nitrite, UA: NEGATIVE
Nitrite, UA: NEGATIVE
Protein, UA: POSITIVE — AB
Protein, UA: POSITIVE — AB
Spec Grav, UA: 1.015 (ref 1.010–1.025)
Spec Grav, UA: 1.015 (ref 1.010–1.025)
Urobilinogen, UA: 0.2 E.U./dL
Urobilinogen, UA: 0.2 E.U./dL
pH, UA: 6.5 (ref 5.0–8.0)
pH, UA: 6.5 (ref 5.0–8.0)

## 2021-05-25 NOTE — Progress Notes (Signed)
? ?Subjective:  ? ? Patient ID: Charlotte Duarte, female    DOB: 1940-04-22, 81 y.o.   MRN: [id number] ? ?HPI Patient presented here March 14 with acute bronchospasm that had onset Friday, March 10.  Had no known COVID exposure.  Symptoms were present for several days.  She denied chest pain dizziness fatigue or fever.  She does not smoke cigarettes.  She was given a Zithromax Z-PAK.  Chest x-ray showed no pneumonia.  She was given a tapering course of prednisone.  Was thought to have acute bronchospasm and right otitis media.  She messaged on March 14 saying she was feeling better. ? ?History of chronic kidney disease stage III, hypertension, renal artery stenosis status post left renal artery PTCA and stenting in 2005 with PCI with in-stent stenosis in 2007.  She was treated with Plavix for a while for renal artery stenosis but that has been discontinued. ? ?Also has history of hyperlipidemia and GE reflux.  History of allergic rhinitis. ? ?Arthroscopic surgery of both knees in [place] by Dr. Para March.  Breast augmentation in 1980 and had implant removal in 2007.  History of lumbar spinal stenosis 2013.  History of right bundle branch block on EKG.  Carpal tunnel release 1993. ? ?Had right eye surgery by Dr. Zadie Rhine on Wednesday, March 8 for vitreomacular adhesion. ? ?Her hemoglobin A1c on March 31 was 6.2% and had been 5.9% in January. ? ?Urine dipstick today is positive for protein and trace LE.  Culture was obtained and proved to have no growth.  She is asymptomatic. ? ?Has bilateral sensorineural hearing loss.  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 ? ?Had right hip arthroplasty by Dr. Ninfa Linden in 2013. ? ?Has hypertension, hyperlipidemia and GE reflux. ? ?Had breast augmentation in 1988 and had implant removed in 2007 ? ?Arthroscopic surgery of both knees 1990 in 1995 by Dr. Noemi Chapel ? ?History of lumbar spinal stenosis treated with epidural steroid injection in 2013 ? ?History of allergic  rhinitis ? ?Had left carpal tunnel release by Dr. Burney Gauze in December 2021 and had left thumb triggering at that time requiring A1 pulley release ? ?History of right bundle branch block on EKG ? ?History of GE reflux ? ?Social history: Social alcohol consumption.  She is a lacto -ovo vegetarian.  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  She is married.  Non-smoker. ? ?Family history: Mother died of acute MI at age 7 mother had breast cancer and an aneurysm in her leg.  Brother with history of brain aneurysm.  Another brother with history of ALS.  Both parents with history of hypertension. ? ?Review of Systems no new complaints-sees [facility] for lumbar spondylosis and back pain ? ?   ?Objective:  ? Physical Exam ?Vital signs reviewed and are stable.  Blood pressure 139/89 left arm, pulse 88, temperature 97.8 degrees,.  BMI 29.82 ?Skin: Warm and dry.  No cervical adenopathy.  No thyromegaly.  No bruits.  Chest clear.  Cardiac exam: Regular rate and rhythm without ectopy.  Abdomen soft nondistended without hepatosplenomegaly masses or tenderness.  No lower extremity pitting edema.  Pap smear deferred due to age.  Neurological exam is intact without gross focal deficits. ? ?Labs from March 31 reviewed.  Creatinine stable and actually slightly improved at 1.29 and was 1.37 in January 2022.  Hemoglobin A1c has increased from January 2022 at 5.9-6.2.  Needs to work on diet and exercise.  Urine culture proved to have no growth.  TSH is normal.  LDL very slightly elevated at 101 normal being less than 100 CBC is stable. ?   ?Assessment & Plan:  ?Chronic kidney disease stage IIIa-stable ? ?Impaired glucose tolerance-stable ? ?Lumbar spinal stenosis and back pain ? ?Right bundle branch block ? ?GE reflux ? 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 ?Plan: Continue current medications and follow-up in 1 year.  Had mammogram in January 2023. ?Need to order Cologuard.  She should check with  pharmacy about her shingles vaccine only see 1 dose in chart. ? ? ?

## 2021-05-26 LAB — URINE CULTURE
MICRO NUMBER:: 13220329
Result:: NO GROWTH
SPECIMEN QUALITY:: ADEQUATE

## 2021-05-31 ENCOUNTER — Encounter (INDEPENDENT_AMBULATORY_CARE_PROVIDER_SITE_OTHER): Payer: Medicare Other | Admitting: Ophthalmology

## 2021-06-03 ENCOUNTER — Ambulatory Visit: Payer: Medicare Other | Admitting: Podiatry

## 2021-06-03 DIAGNOSIS — M76821 Posterior tibial tendinitis, right leg: Secondary | ICD-10-CM

## 2021-06-06 NOTE — Progress Notes (Signed)
She presents today for follow-up posterior tibial tendinitis right.  States that her foot is doing much better since the injection. ? ?Objective: Vital signs are stable alert and oriented x3.  Pulses are palpable.  There is no reproducible pain on palpation. ? ?Assessment: Resolving posterior tibial tendinitis. ? ?Plan: Encouraged her to continue to wear her good shoes at all times with her orthotics follow-up with me on an as-needed basis. ?

## 2021-06-11 ENCOUNTER — Ambulatory Visit: Payer: Medicare Other

## 2021-06-16 ENCOUNTER — Encounter (INDEPENDENT_AMBULATORY_CARE_PROVIDER_SITE_OTHER): Payer: Medicare Other | Admitting: Ophthalmology

## 2021-06-17 ENCOUNTER — Encounter: Payer: Self-pay | Admitting: Internal Medicine

## 2021-06-17 ENCOUNTER — Ambulatory Visit (INDEPENDENT_AMBULATORY_CARE_PROVIDER_SITE_OTHER): Payer: Medicare Other | Admitting: Internal Medicine

## 2021-06-17 DIAGNOSIS — Z0000 Encounter for general adult medical examination without abnormal findings: Secondary | ICD-10-CM | POA: Diagnosis not present

## 2021-06-17 NOTE — Progress Notes (Signed)
?I connected with  Scharlene Corn on 06/17/21 by a audio enabled telemedicine application and verified that I am speaking with the correct person using two identifiers. ? ?Patient Location: Home ? ?Provider Location: Home Office ? ?I discussed the limitations of evaluation and management by telemedicine. The patient expressed understanding and agreed to proceed.  ?Subjective:  ? Charlotte Duarte is a 81 y.o. female who presents for Medicare Annual (Subsequent) preventive examination. ? ?Review of Systems    ?Defer to PCP ?Cardiac Risk Factors include: advanced age (>5mn, >>14women);dyslipidemia;hypertension ? ?   ?Objective:  ?  ?There were no vitals filed for this visit. ?There is no height or weight on file to calculate BMI. ? ? ?  06/17/2021  ?  1:32 PM 03/27/2015  ? 11:01 AM 11/11/2011  ?  3:45 PM 11/04/2011  ?  1:20 PM  ?Advanced Directives  ?Does Patient Have a Medical Advance Directive? Yes No Patient has advance directive, copy not in chart Patient has advance directive, copy not in chart  ?Type of [place] will  Living will Living will  ?Does patient want to make changes to medical advance directive? No - Patient declined     ?Copy of H[place]in Chart? No - copy requested  Copy requested from family Copy requested from family  ?Would patient like information on creating a medical advance directive?  No - patient declined information    ?Pre-existing out of facility DNR order (yellow form or pink MOST form)   No No  ? ? ?Current Medications (verified) ?Outpatient Encounter Medications as of 06/17/2021  ?Medication Sig  ? allopurinol (ZYLOPRIM) 100 MG tablet TAKE 1 TABLET BY MOUTH  DAILY  ? aspirin EC 81 MG tablet Take 81 mg by mouth daily.  ? esomeprazole (NEXIUM) 20 MG packet Take 20 mg by mouth daily before breakfast.  ? fexofenadine (ALLEGRA) 180 MG tablet Take 180 mg by mouth daily. Reported on 04/09/2015  ? fluticasone (VERAMYST) 27.5 MCG/SPRAY  nasal spray  (Patient not taking: Reported on 05/25/2021)  ? Multiple Vitamins-Minerals (PRESERVISION AREDS) TABS   ? predniSONE (DELTASONE) 10 MG tablet Take in tapering course as directed 6-5-4-3-2-1 (Patient not taking: Reported on 05/25/2021)  ? rosuvastatin (CRESTOR) 5 MG tablet TAKE 1 TABLET BY MOUTH  DAILY  ? triamterene-hydrochlorothiazide (MAXZIDE) 75-50 MG tablet TAKE 1 TABLET BY MOUTH  DAILY WITH BREAKFAST  ? ?No facility-administered encounter medications on file as of 06/17/2021.  ? ? ?Allergies (verified) ?Amoxicillin, Keflex [cephalexin], Augmentin [amoxicillin-pot clavulanate], Oseltamivir, and Tamiflu [oseltamivir phosphate]  ? ?History: ?Past Medical History:  ?Diagnosis Date  ? Allergy   ? Arthritis   ? Atrophic vaginitis   ? Cataract   ? 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 GERD (gastroesophageal reflux disease)   ? H/O hiatal hernia   ? Hepatitis   ? 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 Hyperlipidemia   ? Hypertension   ? [place]   ? SUI (stress urinary incontinence, female)   ? ?Past Surgical History:  ?Procedure [place]ality Date  ? [place] ?  both breasts implants REMOVED IN 2005  ? BILATERAL CAPSULECTOMY  2008  ? [place] ? right  ? CATARACT EXTRACTION  20 yrs ago  ? both eyes   ? EYE SURGERY    ? KNEE ARTHROSCOPY    ? BOTH KNEES  ? RENAL STINT    ?  RENAL STINT LEFT RENAL ARTERY IN 2005.  RENAL STINT REPLACE 03/2005.  ? TOTAL HIP ARTHROPLASTY  11/11/2011  ? Procedure: TOTAL HIP ARTHROPLASTY ANTERIOR APPROACH;  Surgeon: Mcarthur Rossetti, MD;  Location: [facility];  Service: Orthopedics;  Laterality: Right;  Right Total Hip Arthroplasty  ? ?Family History  ?Problem Relation Age of Onset  ? Mental illness Mother   ? Breast cancer Mother 61  ? Hypertension Mother   ? Heart disease Father   ? ALS Brother   ? Stroke Brother   ? ?Social History  ? ?Socioeconomic History  ? Marital status: Married  ?  Spouse name: Not on file  ? Number of  children: Not on file  ? Years of education: Not on file  ? Highest education level: Not on file  ?Occupational History  ? Not on file  ?Tobacco Use  ? Smoking status: Former  ?  Types: Cigarettes  ?  Quit date: 07/27/1970  ?  Years since quitting: 50.9  ? Smokeless tobacco: Never  ?Substance and Sexual Activity  ? Alcohol use: Yes  ?  Alcohol/week: 10.0 standard drinks  ?  Types: 10 Glasses of wine per week  ?  Comment: 1-2 glasses of wine daily  ? Drug use: No  ? Sexual activity: Yes  ?  Birth control/protection: Post-menopausal  ?Other Topics Concern  ? Not on file  ?Social History Narrative  ? Not on file  ? ?Social Determinants of Health  ? ?Financial Resource Strain: Low Risk   ? Difficulty of Paying Living Expenses: Not hard at all  ?Food Insecurity: No Food Insecurity  ? Worried About Charity fundraiser in the Last Year: Never true  ? Ran Out of Food in the Last Year: Never true  ?Transportation Needs: No Transportation Needs  ? Lack of Transportation (Medical): No  ? Lack of Transportation (Non-Medical): No  ?Physical Activity: Insufficiently Active  ? Days of Exercise per Week: 2 days  ? Minutes of Exercise per Session: 30 min  ?Stress: No Stress Concern Present  ? Feeling of Stress : Not at all  ?Social Connections: Moderately Isolated  ? Frequency of Communication with Friends and Family: Three times a week  ? Frequency of Social Gatherings with Friends and Family: Three times a week  ? Attends Religious Services: Never  ? Active Member of Clubs or Organizations: No  ? Attends Archivist Meetings: Never  ? Marital Status: Married  ? ? ?Tobacco Counseling ?Counseling given: Not Answered ? ? ?Clinical Intake: ? ?Pre-visit preparation completed: Yes ? ?Pain : No/denies pain ? ?  ? ?Diabetes: No ? ?How often do you need to have someone help you when you read instructions, pamphlets, or other written materials from your doctor or pharmacy?: 1 - Never ? ?Diabetic?N/A ? ?Interpreter Needed?: No ? ?   ? ? ?Activities of Daily Living ? ?  06/17/2021  ?  1:33 PM  ?In your present state of health, do you have any difficulty performing the following activities:  ?Hearing? 0  ?Vision? 0  ?Difficulty concentrating or making decisions? 0  ?Walking or climbing stairs? 0  ?Dressing or bathing? 0  ?Doing errands, shopping? 0  ?Preparing Food and eating ? N  ?Using the Toilet? N  ?In the past six months, have you accidently leaked urine? N  ?Do you have problems with loss of bowel control? N  ?Managing your Medications? N  ?Managing your Finances? N  ?Housekeeping or managing your Housekeeping? N  ? ? ?  Patient Care Team: ?Elby Showers, MD as PCP - General (Internal Medicine) ?Jamal Maes, MD as Consulting Physician (Nephrology) ?Ortho, [facility] (Specialist) ?Starlyn Skeans as Librarian, academic (Dermatology) ? ?Indicate any recent Medical Services you may have received from other than [facility] providers in the past year (date may be approximate). ? ?   ?Assessment:  ? This is a routine wellness examination for [place] Searles. ? ?Hearing/Vision screen ?No results found. ? ?Dietary issues and exercise activities discussed: ?Current Exercise Habits: The patient does not participate in regular exercise at present, Exercise limited by: None identified ? ? Goals Addressed   ?None ?  ?Depression Screen ? ?  06/17/2021  ?  1:32 PM 03/12/2020  ? 10:01 AM 06/06/2019  ?  2:47 PM 03/07/2019  ?  3:05 PM 03/02/2018  ?  2:20 PM 02/27/2017  ?  2:10 PM 03/26/2015  ?  1:29 PM  ?PHQ 2/9 Scores  ?PHQ - 2 Score 0 0 1 0 0 0 0  ?  ?Fall Risk ? ?  06/17/2021  ?  1:32 PM 05/25/2021  ? 10:58 AM 03/12/2020  ? 10:01 AM 06/06/2019  ?  2:47 PM 06/06/2019  ?  2:45 PM  ?Fall Risk   ?Falls in the past year? 0 0 0 0 0  ?Number falls in past yr: 0 0 0 0   ?Injury with Fall? 0 0 0 0   ?Risk for fall due to : No Fall Risks   No Fall Risks   ?Follow up Falls evaluation completed  Falls evaluation completed    ? ? ?FALL RISK PREVENTION PERTAINING TO THE HOME: ? ?Any stairs  in or around the home? Yes  ?If so, are there any without handrails? Yes  ?Home free of loose throw rugs in walkways, pet beds, electrical cords, etc? Yes  ?Adequate lighting in your home to reduce risk of

## 2021-06-20 NOTE — Patient Instructions (Addendum)
She has recovered from recent respiratory illness.  She looks well.  She feels well.  She will continue with current medications and follow-up in 1 year or as needed.  Tetanus is up-to-date. ?

## 2021-06-23 ENCOUNTER — Encounter (INDEPENDENT_AMBULATORY_CARE_PROVIDER_SITE_OTHER): Payer: Self-pay | Admitting: Ophthalmology

## 2021-06-23 ENCOUNTER — Encounter (INDEPENDENT_AMBULATORY_CARE_PROVIDER_SITE_OTHER): Payer: Medicare Other | Admitting: Ophthalmology

## 2021-06-23 ENCOUNTER — Ambulatory Visit (INDEPENDENT_AMBULATORY_CARE_PROVIDER_SITE_OTHER): Payer: Medicare Other | Admitting: Ophthalmology

## 2021-06-23 DIAGNOSIS — H353112 Nonexudative age-related macular degeneration, right eye, intermediate dry stage: Secondary | ICD-10-CM

## 2021-06-23 DIAGNOSIS — H43821 Vitreomacular adhesion, right eye: Secondary | ICD-10-CM

## 2021-06-23 DIAGNOSIS — H3554 Dystrophies primarily involving the retinal pigment epithelium: Secondary | ICD-10-CM

## 2021-06-23 NOTE — Progress Notes (Signed)
? ? ?06/23/2021 ? ?  ? ?CHIEF COMPLAINT ?Patient presents for  ?Chief Complaint  ?Patient presents with  ? Post-op Follow-up  ? ? ?Vitrectomy OD for broad-based VMA/VMT with subfoveal drusenoid deposit. ? ?HISTORY OF PRESENT ILLNESS: ?Charlotte Duarte is a 81 y.o. female who presents to the clinic today for:  ? ?HPI   ? ? Post-op Follow-up   ? ?      ? Laterality: right eye  ? Discomfort: Negative for pain, itching, foreign body sensation, tearing, discharge and floaters  ? Vision: is improved  ? ?  ?  ? ? Comments   ?5 weeks for DILATE, OD, OCT POST OP.  Overall 2 months post vitrectomy for VMA, VMT. ? ? ?Pt stated there has been improvement in vision. ?Pt denies floaters and FOL. ? ? ?  ?  ?Last edited by Hurman Horn, MD on 06/23/2021 10:45 AM.  ?  ? ? ?Referring physician: ?Elby Showers, MD ?[address] ?[place],  [place] [id number] ? ?HISTORICAL INFORMATION:  ? ?Selected notes from the [facility] ?  ? ?Lab Results  ?Component Value Date  ? HGBA1C 6.2 (H) 05/21/2021  ?  ? ?CURRENT MEDICATIONS: ?No current outpatient medications on file. (Ophthalmic Drugs)  ? ?No current facility-administered medications for this visit. (Ophthalmic Drugs)  ? ?Current Outpatient Medications (Other)  ?Medication Sig  ? allopurinol (ZYLOPRIM) 100 MG tablet TAKE 1 TABLET BY MOUTH  DAILY  ? aspirin EC 81 MG tablet Take 81 mg by mouth daily.  ? esomeprazole (NEXIUM) 20 MG packet Take 20 mg by mouth daily before breakfast.  ? fexofenadine (ALLEGRA) 180 MG tablet Take 180 mg by mouth daily. Reported on 04/09/2015  ? fluticasone (VERAMYST) 27.5 MCG/SPRAY nasal spray  (Patient not taking: Reported on 05/25/2021)  ? Multiple Vitamins-Minerals (PRESERVISION AREDS) TABS   ? predniSONE (DELTASONE) 10 MG tablet Take in tapering course as directed 6-5-4-3-2-1 (Patient not taking: Reported on 05/25/2021)  ? rosuvastatin (CRESTOR) 5 MG tablet TAKE 1 TABLET BY MOUTH  DAILY  ? triamterene-hydrochlorothiazide (MAXZIDE) 75-50 MG tablet TAKE 1  TABLET BY MOUTH  DAILY WITH BREAKFAST  ? ?No current facility-administered medications for this visit. (Other)  ? ? ? ? ?REVIEW OF SYSTEMS: ?ROS   ?Negative for: Constitutional, Gastrointestinal, Neurological, Skin, Genitourinary, Musculoskeletal, HENT, Endocrine, Cardiovascular, Eyes, Respiratory, Psychiatric, Allergic/Imm, Heme/Lymph ?Last edited by Silvestre Moment on 06/23/2021  9:51 AM.  ?  ? ? ? ?ALLERGIES ?Allergies  ?Allergen Reactions  ? Amoxicillin Anaphylaxis  ? Keflex [Cephalexin] Hives and Itching  ?  "Uncontrollable itching" ?  ? Augmentin [Amoxicillin-Pot Clavulanate] Nausea And Vomiting  ?  Has patient had a PCN reaction causing immediate rash, facial/tongue/throat swelling, SOB or lightheadedness with hypotension: No ?Has patient had a PCN reaction causing severe rash involving mucus membranes or skin necrosis: No ?Has patient had a PCN reaction that required hospitalization: No ?Has patient had a PCN reaction occurring within the last 10 years: No ?If all of the above answers are "NO", then may proceed with Cephalosporin use. ?  ? Oseltamivir Nausea Only and Nausea And Vomiting  ? Tamiflu [Oseltamivir Phosphate] Nausea And Vomiting  ? ? ?PAST MEDICAL HISTORY ?Past Medical History:  ?Diagnosis Date  ? Allergy   ? Arthritis   ? Atrophic vaginitis   ? Cataract   ? 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 GERD (gastroesophageal reflux disease)   ? H/O hiatal hernia   ? Hepatitis   ? hepatitis b  antibodies  1970's  ? Hyperlipidemia   ? Hypertension   ? Renal artery stenosis ([place])   ? SUI (stress urinary incontinence, female)   ? ?Past Surgical History:  ?Procedure Laterality Date  ? [place]  ?  both breasts implants REMOVED IN 2005  ? BILATERAL CAPSULECTOMY  2008  ? [place]  ? right  ? CATARACT EXTRACTION  20 yrs ago  ? both eyes   ? EYE SURGERY    ? KNEE ARTHROSCOPY    ? BOTH KNEES  ? RENAL STINT    ? RENAL STINT LEFT RENAL ARTERY IN 2005.  RENAL STINT REPLACE 03/2005.   ? TOTAL HIP ARTHROPLASTY  11/11/2011  ? Procedure: TOTAL HIP ARTHROPLASTY ANTERIOR APPROACH;  Surgeon: Mcarthur Rossetti, MD;  Location: [facility];  Service: Orthopedics;  Laterality: Right;  Right Total Hip Arthroplasty  ? ? ?FAMILY HISTORY ?Family History  ?Problem Relation Age of Onset  ? Mental illness Mother   ? Breast cancer Mother 19  ? Hypertension Mother   ? Heart disease Father   ? ALS Brother   ? Stroke Brother   ? ? ?SOCIAL HISTORY ?Social History  ? ?Tobacco Use  ? Smoking status: Former  ?  Types: Cigarettes  ?  Quit date: 07/27/1970  ?  Years since quitting: 50.9  ? Smokeless tobacco: Never  ?Substance Use Topics  ? Alcohol use: Yes  ?  Alcohol/week: 10.0 standard drinks  ?  Types: 10 Glasses of wine per week  ?  Comment: 1-2 glasses of wine daily  ? Drug use: No  ? ?  ? ?  ? ?OPHTHALMIC EXAM: ? ?Base Eye Exam   ? ? Visual Acuity (ETDRS)   ? ?   Right Left  ? Dist [place] 20/100 20/20  ? Dist ph [place] 20/50   ? ?  ?  ? ? Tonometry (Tonopen, 9:58 AM)   ? ?   Right Left  ? Pressure 14 10  ? ?  ?  ? ? Pupils   ? ?   Pupils APD  ? Right PERRL None  ? Left PERRL None  ? ?  ?  ? ? Visual Fields   ? ?   Left Right  ?  Full Full  ? ?  ?  ? ? Extraocular Movement   ? ?   Right Left  ?  Full Full  ? ?  ?  ? ? Neuro/Psych   ? ? Oriented x3: Yes  ? Mood/Affect: Normal  ? ?  ?  ? ? Dilation   ? ? Both eyes: 1.0% Mydriacyl, 2.5% Phenylephrine @ 9:58 AM  ? ?  ?  ? ?  ? ?Slit Lamp and Fundus Exam   ? ? External Exam   ? ?   Right Left  ? External Normal Normal  ? ?  ?  ? ? Slit Lamp Exam   ? ?   Right Left  ? Lids/Lashes Normal Normal  ? Conjunctiva/Sclera 2+ Injection White and quiet  ? Cornea Clear Clear  ? Anterior Chamber Deep and quiet Deep and quiet  ? [place]ris Pharmacologically dilated Round and reactive  ? Lens Centered posterior chamber intraocular lens, Open posterior capsule Centered posterior chamber intraocular lens, 1+ Posterior capsular opacification  ? Anterior Vitreous Clear Normal  ? ?  ?  ? ? Fundus Exam   ? ?    Right Left  ? Posterior Vitreous Clear, avitric   ?  Disc Normal   ? C/D Ratio 0.2   ? Macula Subfoveal drusenoid deposit pseudovitelliform like, approximately 600 ?m in diameter, no adjacent fluid, pigmentary changes well, adult vitelliform macular dystrophy appearance   ? Vessels Normal   ? Periphery Normal   ? ?  ?  ? ?  ? ? ?IMAGING AND PROCEDURES  ?Imaging and Procedures for 06/23/21 ? ?OCT, Retina - OU - Both Eyes   ? ?   ?Right Eye ?Quality was good. Scan locations included subfoveal. Central Foveal Thickness: 329. Progression has improved. Findings include no SRF, no IRF, retinal drusen , subretinal hyper-reflective material, pigment epithelial detachment.  ? ?Left Eye ?Quality was good. Scan locations included subfoveal. Central Foveal Thickness: 266. Progression has no prior data. Findings include normal foveal contour, no SRF, no IRF, retinal drusen .  ? ?Notes ?Subfoveal drusenoid type deposit of adult vitelliform macular dystrophy right eye.  No overt vitreomacular adhesion Yet suspicious for small intraretinal foveal traction which if traction is present on the fovea, could lead to this type of drusenoid appearance.  Still no definable PVD OD with vertical height now improved in the right eye ?This today is now at 207, from recent 213 microns from previous initial thickness of 229 microns, measuring the dome of the drusenoid deposits subfoveal. ? ?OS incidental perifoveal operculum, no hole.  Diffuse punctate hard drusen at the level of Bruch's membrane ? ?  ? ? ?  ?  ? ?  ?ASSESSMENT/PLAN: ? ?Vitreomacular adhesion of right eye ?Today at 2 months post vitrectomy for VMT broad-based potentially triggering subfoveal vitelliform posit in the foveal region.  The dome of the drusenoid material is smaller in vertical height so far.  229 initial microns now to 207. ? ? ? ?Intermediate stage nonexudative age-related macular degeneration of right eye ?Stable acuity OD ?  ? ?  ICD-10-CM   ?1. Vitreomacular  adhesion of right eye  H43.821 OCT, Retina - OU - Both Eyes  ?  ?2. Adult vitelliform macular dystrophy  H35.54 OCT, Retina - OU - Both Eyes  ?  ?3. Intermediate stage nonexudative age-related macular dege

## 2021-06-23 NOTE — Assessment & Plan Note (Signed)
Stable acuity OD ?

## 2021-06-23 NOTE — Assessment & Plan Note (Signed)
Today at 2 months post vitrectomy for VMT broad-based potentially triggering subfoveal vitelliform posit in the foveal region.  The dome of the drusenoid material is smaller in vertical height so far.  229 initial microns now to 207. ? ? ?

## 2021-06-28 ENCOUNTER — Telehealth: Payer: Self-pay | Admitting: Internal Medicine

## 2021-06-28 NOTE — Telephone Encounter (Signed)
Appointment scheduled.

## 2021-06-28 NOTE — Telephone Encounter (Signed)
[name] ?[phone] ? ?Taquila Leys called to see if she could get a cardiology referral, she stated her husband just had a heart attack wit no warning. She said she has a family history, LDL is abnormal and she has other reasons too. I let her know she may need office visit to discuss.  ? ?

## 2021-07-06 ENCOUNTER — Ambulatory Visit (INDEPENDENT_AMBULATORY_CARE_PROVIDER_SITE_OTHER): Payer: Medicare Other | Admitting: Internal Medicine

## 2021-07-06 ENCOUNTER — Encounter: Payer: Self-pay | Admitting: Internal Medicine

## 2021-07-06 VITALS — BP 148/90 | HR 71 | Temp 98.5°F | Wt 150.2 lb

## 2021-07-06 DIAGNOSIS — I10 Essential (primary) hypertension: Secondary | ICD-10-CM | POA: Diagnosis not present

## 2021-07-06 DIAGNOSIS — R42 Dizziness and giddiness: Secondary | ICD-10-CM | POA: Diagnosis not present

## 2021-07-06 DIAGNOSIS — Z8679 Personal history of other diseases of the circulatory system: Secondary | ICD-10-CM

## 2021-07-06 DIAGNOSIS — N1831 Chronic kidney disease, stage 3a: Secondary | ICD-10-CM | POA: Diagnosis not present

## 2021-07-06 DIAGNOSIS — Z8249 Family history of ischemic heart disease and other diseases of the circulatory system: Secondary | ICD-10-CM

## 2021-07-06 DIAGNOSIS — E7800 Pure hypercholesterolemia, unspecified: Secondary | ICD-10-CM

## 2021-07-06 MED ORDER — MECLIZINE HCL 25 MG PO TABS: 25.0000 mg | ORAL_TABLET | Freq: Three times a day (TID) | ORAL | 0 refills | Status: DC | PRN

## 2021-07-06 NOTE — Patient Instructions (Signed)
CT coronary calcium score ordered at [facility].  Try Antivert 25 mg up to 3 times daily as needed for vertigo.  Please let us know if vertigo is not improved in 7 to 10 days or sooner if worse.  Continue statin medication. ?

## 2021-07-06 NOTE — Addendum Note (Signed)
Addended by: Angus Seller on: 07/06/2021 04:17 PM ? ? Modules accepted: Orders ? ?

## 2021-07-06 NOTE — Progress Notes (Signed)
? ?  Subjective:  ? ? Patient ID: Charlotte Duarte, female    DOB: Mar 04, 1940, 81 y.o.   MRN: [id number] ? ?HPI  81 year old Female seen to discuss evaluation for heart disease.  She has a ?Family hx of heart disease in father and paternal grandfather.  Her husband recently had to undergo bypass surgery which started her thinking about her own health and cardiology issues. ? ?Patient is on low-dose statin.  She has no chest pain or shortness of breath.  Her lipid panel was essentially normal when checked in March 2023.  She does not smoke.  She denies chest pain or shortness of breath.  No dizziness, fatigue.  History of chronic kidney disease stage III, hypertension, renal artery stenosis status post left renal artery PTCA and stenting in 2005 with PCI with in-stent stenosis in 2007.  She was treated with Plavix for a while for renal artery stenosis but that has been discontinued. ? ?History of glucose intolerance with hemoglobin A1c 6.2% when checked May 21, 2021.  Currently on diet alone. ? ? ?Review of Systems see above ? ?   ?Objective:  ? Physical Exam ?She is a little anxious today and blood pressure is 148/90.  She will continue to monitor her blood pressure at home and let me know if persistently elevated.  Pulse is 71 and regular.  Temperature 98.5 degrees pulse oximetry 98% BMI 29.59 weight 150 pounds 4 ounces ? ?Skin: Warm and dry.  TMs are clear.  Extraocular movements are full without nystagmus.  PERRLA.  No facial weakness.  Cranial nerves II through XII are grossly intact.  Muscle strength is normal in the upper and lower extremities. ? ?Her chest is clear.  No carotid bruits.  Cardiac exam: Regular rate and rhythm without murmurs or gallops.  Her chest is clear to auscultation. ? ? ? ?   ?Assessment & Plan:  ?Vertigo-I am not sure why she is having recent bouts of vertigo.  This apparently started even prior to her husband's illness.  She will take Antivert 25 mg up to 3 times daily as needed for  vertigo.  If she is not improving in 7 to 10 days, she is to let us know and we can consider further evaluation.  No nystagmus is identified today. ? ?Requested screening for coronary artery disease by patient: Family history of heart disease in father and paternal grandfather.  Have ordered CT coronary calcium scoring.  She is already on statin therapy. ? ?She also has a history of left renal artery PTCA and chronic kidney disease. ? ?

## 2021-07-16 DIAGNOSIS — L570 Actinic keratosis: Secondary | ICD-10-CM | POA: Diagnosis not present

## 2021-07-17 ENCOUNTER — Other Ambulatory Visit: Payer: Self-pay | Admitting: Internal Medicine

## 2021-08-13 ENCOUNTER — Telehealth: Payer: Medicare Other | Admitting: Physician Assistant

## 2021-08-13 ENCOUNTER — Telehealth: Payer: Self-pay | Admitting: Internal Medicine

## 2021-08-13 DIAGNOSIS — R3989 Other symptoms and signs involving the genitourinary system: Secondary | ICD-10-CM

## 2021-08-13 MED ORDER — SULFAMETHOXAZOLE-TRIMETHOPRIM 800-160 MG PO TABS: 1.0000 | ORAL_TABLET | Freq: Two times a day (BID) | ORAL | 0 refills | Status: DC

## 2021-08-16 NOTE — Telephone Encounter (Signed)
Scheduled follow up for UTI

## 2021-08-18 ENCOUNTER — Encounter (INDEPENDENT_AMBULATORY_CARE_PROVIDER_SITE_OTHER): Payer: Medicare Other | Admitting: Ophthalmology

## 2021-08-20 ENCOUNTER — Encounter: Payer: Self-pay | Admitting: Internal Medicine

## 2021-08-20 ENCOUNTER — Ambulatory Visit (INDEPENDENT_AMBULATORY_CARE_PROVIDER_SITE_OTHER): Payer: Medicare Other | Admitting: Internal Medicine

## 2021-08-20 VITALS — BP 118/80 | HR 68 | Temp 97.7°F | Wt 148.5 lb

## 2021-08-20 DIAGNOSIS — Z8679 Personal history of other diseases of the circulatory system: Secondary | ICD-10-CM

## 2021-08-20 DIAGNOSIS — I10 Essential (primary) hypertension: Secondary | ICD-10-CM

## 2021-08-20 DIAGNOSIS — N390 Urinary tract infection, site not specified: Secondary | ICD-10-CM | POA: Diagnosis not present

## 2021-08-20 DIAGNOSIS — R3989 Other symptoms and signs involving the genitourinary system: Secondary | ICD-10-CM | POA: Diagnosis not present

## 2021-08-20 DIAGNOSIS — N1831 Chronic kidney disease, stage 3a: Secondary | ICD-10-CM

## 2021-08-20 LAB — POCT URINALYSIS DIPSTICK
Bilirubin, UA: NEGATIVE
Blood, UA: NEGATIVE
Glucose, UA: NEGATIVE
Ketones, UA: NEGATIVE
Leukocytes, UA: NEGATIVE
Nitrite, UA: NEGATIVE
Protein, UA: NEGATIVE
Spec Grav, UA: 1.01 (ref 1.010–1.025)
Urobilinogen, UA: 0.2 E.U./dL
pH, UA: 6 (ref 5.0–8.0)

## 2021-08-20 MED ORDER — SULFAMETHOXAZOLE-TRIMETHOPRIM 800-160 MG PO TABS: 1.0000 | ORAL_TABLET | Freq: Two times a day (BID) | ORAL | 0 refills | Status: DC

## 2021-08-20 NOTE — Progress Notes (Signed)
Follow up  Subjective:    Patient ID: Charlotte Duarte, female    DOB: [birth year], 81 y.o.   MRN: [id number]  HPI 81 year old Female was seen by [facility] nurse practitioner on June 23rd.  Patient had onset of dysuria the previous day which was gradually worsening.  No fever.  Patient had frequency and urgency.  No chills, flank pain or hematuria.  No nausea or vomiting.  No history of recurrent UTIs.  Patient was treated with Bactrim DS 800-160 twice daily for 5 days.  Was told she could use Azo Standard for bladder spasm.  She is now here for follow-up.  She has a history of hypertension, hyperlipidemia and GE reflux.  History of allergic rhinitis.  Patient has a history of chronic kidney disease stage III, hypertension, renal artery stenosis status post left renal artery PTCA and stenting in 2005.  Had PCI with in-stent stenosis in 2007.  History of hyperlipidemia and GE reflux.  History of allergic rhinitis.  History of impaired glucose tolerance.  Had acute bronchospasm in March 2023 that resolved after treatment with Zithromax and prednisone.  Had health maintenance exam and Medicare wellness visit April 2023.  Feeling well now with no new complaints  Review of Systems no complaints of dysuria, hematuria or urinary frequency.     Objective:   Physical Exam Blood pressure 118/80 pulse 68 temperature 97.7 degrees pulse oximetry 97% weight 148 pounds 8 ounces BMI 29.25  No CVA tenderness.  She is in no acute distress.  Clean catch urine dipstick today shows specific gravity 1.010 pH of 6.0 no occult blood, no protein, no nitrite or LE.       Assessment & Plan:  Status post treatment of recent UTI with Bactrim per virtual visit.  Symptoms have resolved and patient is asymptomatic.  Plan: Return as needed.  Given 1 refill on Bactrim DS twice daily for 5 days as she will be traveling in the near future.

## 2021-08-20 NOTE — Patient Instructions (Addendum)
Patient is feeling better status posttreatment for UTI virtually with Bactrim DS twice daily for 5 days.  Her urine specimen today is completely normal.  She is asymptomatic.  She has been given 1 refill and she will be traveling in the near future.

## 2021-09-06 ENCOUNTER — Ambulatory Visit (HOSPITAL_BASED_OUTPATIENT_CLINIC_OR_DEPARTMENT_OTHER)
Admission: RE | Admit: 2021-09-06 | Discharge: 2021-09-06 | Disposition: A | Discharge status: Home / Self Care | Payer: Medicare Other | Source: Ambulatory Visit | Attending: Internal Medicine | Admitting: Internal Medicine

## 2021-09-06 DIAGNOSIS — R42 Dizziness and giddiness: Secondary | ICD-10-CM | POA: Insufficient documentation

## 2021-09-08 ENCOUNTER — Ambulatory Visit (INDEPENDENT_AMBULATORY_CARE_PROVIDER_SITE_OTHER): Payer: Medicare Other | Admitting: Ophthalmology

## 2021-09-08 ENCOUNTER — Encounter (INDEPENDENT_AMBULATORY_CARE_PROVIDER_SITE_OTHER): Payer: Medicare Other | Admitting: Ophthalmology

## 2021-09-08 ENCOUNTER — Encounter (INDEPENDENT_AMBULATORY_CARE_PROVIDER_SITE_OTHER): Payer: Self-pay | Admitting: Ophthalmology

## 2021-09-08 DIAGNOSIS — H43821 Vitreomacular adhesion, right eye: Secondary | ICD-10-CM

## 2021-09-08 DIAGNOSIS — H3554 Dystrophies primarily involving the retinal pigment epithelium: Secondary | ICD-10-CM

## 2021-09-08 NOTE — Assessment & Plan Note (Signed)
Release of VMA and successful and now slightly smaller large vitelliform subfoveal lesion material slowly diminishing in size and height

## 2021-09-08 NOTE — Assessment & Plan Note (Signed)
Preoperative vision in 20/100 with pinhole 20/60 has improved today now to 20/50 pinhole.  Vertical height of drusenoid lesion has diminished from 217 now to 198 m

## 2021-09-08 NOTE — Progress Notes (Signed)
09/08/2021     CHIEF COMPLAINT Patient presents for  Chief Complaint  Patient presents with   Macular Degeneration      HISTORY OF PRESENT ILLNESS: Charlotte Duarte is a 81 y.o. female who presents to the clinic today for:   HPI   11 weeks dilate OD OCT,, post vitrectomy OD for broad-based VMA with secondary large vitelliform type drusenoid lesion. Pt states she is not sure if her vision has been stable  Pt denies any new floaters or FOL Last edited by Hurman Horn, MD on 09/08/2021 11:04 AM.      Referring physician: Elby Showers, MD [address] [place],  [place] [id number]  HISTORICAL INFORMATION:   Selected notes from the MEDICAL RECORD NUMBER    Lab Results  Component Value Date   HGBA1C 6.2 (H) 05/21/2021     CURRENT MEDICATIONS: No current outpatient medications on file. (Ophthalmic Drugs)   No current facility-administered medications for this visit. (Ophthalmic Drugs)   Current Outpatient Medications (Other)  Medication Sig   allopurinol (ZYLOPRIM) 100 MG tablet TAKE 1 TABLET BY MOUTH  DAILY   aspirin EC 81 MG tablet Take 81 mg by mouth daily.   esomeprazole (NEXIUM) 20 MG packet Take 20 mg by mouth daily before breakfast.   fexofenadine (ALLEGRA) 180 MG tablet Take 180 mg by mouth daily. Reported on 04/09/2015   fluticasone (VERAMYST) 27.5 MCG/SPRAY nasal spray    meclizine (ANTIVERT) 25 MG tablet Take 1 tablet (25 mg total) by mouth 3 (three) times daily as needed for dizziness.   Multiple Vitamins-Minerals (PRESERVISION AREDS) TABS    rosuvastatin (CRESTOR) 5 MG tablet TAKE 1 TABLET BY MOUTH  DAILY   sulfamethoxazole-trimethoprim (BACTRIM DS) 800-160 MG tablet Take 1 tablet by mouth 2 (two) times daily.   triamterene-hydrochlorothiazide (MAXZIDE) 75-50 MG tablet TAKE 1 TABLET BY MOUTH  DAILY WITH BREAKFAST   UNABLE TO FIND Med Name: CBD gummy   UNABLE TO FIND Med Name: Sleep eaze   No current facility-administered medications for this visit.  (Other)      REVIEW OF SYSTEMS: ROS   Negative for: Constitutional, Gastrointestinal, Neurological, Skin, Genitourinary, Musculoskeletal, HENT, Endocrine, Cardiovascular, Eyes, Respiratory, Psychiatric, Allergic/Imm, Heme/Lymph Last edited by Morene Rankins, CMA on 09/08/2021 10:25 AM.       ALLERGIES Allergies  Allergen Reactions   Amoxicillin Anaphylaxis   Keflex [Cephalexin] Hives and Itching    "Uncontrollable itching"    Augmentin [Amoxicillin-Pot Clavulanate] Nausea And Vomiting    Has patient had a PCN reaction causing immediate rash, facial/tongue/throat swelling, SOB or lightheadedness with hypotension: No Has patient had a PCN reaction causing severe rash involving mucus membranes or skin necrosis: No Has patient had a PCN reaction that required hospitalization: No Has patient had a PCN reaction occurring within the last 10 years: No If all of the above answers are "NO", then may proceed with Cephalosporin use.    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  RENAL STINT REPLACE 03/2005.   TOTAL HIP ARTHROPLASTY  11/11/2011   Procedure: TOTAL HIP ARTHROPLASTY ANTERIOR  APPROACH;  Surgeon: Mcarthur Rossetti, MD;  Location: [facility];  Service: Orthopedics;  Laterality: Right;  Right Total Hip Arthroplasty    FAMILY HISTORY Family History  Problem Relation Age of Onset   Mental illness Mother    Breast cancer Mother 31   Hypertension Mother    Heart disease Father    ALS Brother    Stroke Brother     SOCIAL HISTORY Social History   Tobacco Use   Smoking status: Former    Types: Cigarettes    Quit date: 07/27/1970    Years since quitting: 51.1   Smokeless tobacco: Never  Substance Use Topics   Alcohol use: Yes    Alcohol/week: 10.0 standard drinks of alcohol    Types: 10 Glasses of wine per week    Comment: 1-2 glasses of wine daily   Drug use: No         OPHTHALMIC EXAM:  Base Eye Exam     Visual Acuity (ETDRS)       Right Left   Dist [place] 20/100 +1 20/20   Dist cc 20/50          Tonometry (Tonopen, 10:32 AM)       Right Left   Pressure 14 13         Pupils       Pupils APD   Right PERRL None   Left PERRL None         Neuro/Psych     Oriented x3: Yes   Mood/Affect: Normal         Dilation     Both eyes: 2.5% Phenylephrine, 1.0% Mydriacyl @ 10:26 AM           Slit Lamp and Fundus Exam     External Exam       Right Left   External Normal Normal         Slit Lamp Exam       Right Left   Lids/Lashes Normal Normal   Conjunctiva/Sclera 2+ Injection White and quiet   Cornea Clear Clear   Anterior Chamber Deep and quiet Deep and quiet   Iris Pharmacologically dilated Round and reactive   Lens Centered posterior chamber intraocular lens, Open posterior capsule Centered posterior chamber intraocular lens, 1+ Posterior capsular opacification   Anterior Vitreous Clear Normal         Fundus Exam       Right Left   Posterior Vitreous Clear, avitric Normal   Disc Normal Normal   C/D Ratio 0.2 0.2   Macula Subfoveal drusenoid deposit pseudovitelliform like, approximately 600 m in diameter,  no adjacent fluid, pigmentary changes well, adult vitelliform macular dystrophy appearance Hard drusen, no macular thickening   Vessels Normal Normal   Periphery Normal Normal            IMAGING AND PROCEDURES  Imaging and Procedures  for 09/08/21  OCT, Retina - OU - Both Eyes       Right Eye Quality was good. Scan locations included subfoveal. Central Foveal Thickness: 342. Progression has improved. Findings include no IRF, no SRF, retinal drusen , subretinal hyper-reflective material, pigment epithelial detachment.   Left Eye Quality was good. Scan locations included subfoveal. Central Foveal Thickness: 266. Progression has no prior data. Findings include normal foveal contour, no IRF, no SRF, retinal drusen .   Notes Subfoveal drusenoid type deposit of adult vitelliform macular dystrophy right eye.  No overt vitreomacular adhesion Yet suspicious for small intraretinal foveal traction which if traction is present on the fovea, could lead to this type of drusenoid appearance.  Still no definable PVD OD with vertical height now improved in the right eye This today is now at 198 microns from recent 213 microns from previous initial thickness of 229 microns, measuring the dome of the drusenoid deposits subfoveal.  OS incidental perifoveal operculum, no hole.  Diffuse punctate hard drusen at the level of Bruch's membrane             ASSESSMENT/PLAN:  Vitreomacular adhesion of right eye Release of VMA and successful and now slightly smaller large vitelliform subfoveal lesion material slowly diminishing in size and height  Adult vitelliform macular dystrophy Preoperative vision in 20/100 with pinhole 20/60 has improved today now to 20/50 pinhole.  Vertical height of drusenoid lesion has diminished from 217 now to 198 m      ICD-10-CM   1. Vitreomacular adhesion of right eye  H43.821 OCT, Retina - OU - Both Eyes    2. 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   OD, and vastly improved macular anatomy and slowly improving subfoveal drusenoid appearance to large lesion.  Stable if not slightly improved acuity OD.  2.  3.  Ophthalmic Meds Ordered this visit:  No orders of the defined types were placed in this encounter.      Return in about 6 months (around 03/11/2022) for DILATE OU, COLOR FP, OCT.  There are no Patient Instructions on file for this visit.   Explained the diagnoses, plan, and follow up with the patient and they expressed understanding.  Patient expressed understanding of the importance of proper follow up care.   Clent Demark Vale Peraza M.D. Diseases & Surgery of the Retina and Vitreous [facility] [place] 09/08/21     Abbreviations: M myopia (nearsighted); A astigmatism; H hyperopia (farsighted); P presbyopia; Mrx spectacle prescription;  CTL contact lenses; OD right eye; OS left eye; OU both eyes  XT exotropia; ET esotropia; PEK punctate epithelial keratitis; PEE punctate epithelial erosions; DES dry eye syndrome; MGD meibomian gland dysfunction; ATs artificial tears; PFAT's preservative free artificial tears; Hines nuclear sclerotic cataract; PSC posterior subcapsular cataract; ERM epi-retinal membrane; PVD posterior vitreous detachment; RD retinal detachment; DM diabetes mellitus; DR diabetic retinopathy; NPDR non-proliferative diabetic retinopathy; PDR proliferative diabetic retinopathy; CSME clinically significant macular edema; DME diabetic macular edema; dbh dot blot hemorrhages; CWS cotton wool spot; POAG primary open angle glaucoma; C/D cup-to-disc ratio; HVF humphrey visual field; GVF goldmann visual field; OCT optical coherence tomography; IOP intraocular pressure; BRVO Branch retinal vein occlusion; CRVO central retinal vein occlusion; CRAO central retinal artery occlusion; BRAO branch retinal artery occlusion; RT retinal tear; SB scleral buckle; PPV pars plana vitrectomy; VH Vitreous hemorrhage; PRP panretinal laser  photocoagulation; IVK intravitreal kenalog; VMT vitreomacular traction; MH Macular hole;  NVD neovascularization of the  disc; NVE neovascularization elsewhere; AREDS age related eye disease study; ARMD age related macular degeneration; POAG primary open angle glaucoma; EBMD epithelial/anterior basement membrane dystrophy; ACIOL anterior chamber intraocular lens; IOL intraocular lens; PCIOL posterior chamber intraocular lens; Phaco/IOL phacoemulsification with intraocular lens placement; [place] photorefractive keratectomy; LASIK laser assisted in situ keratomileusis; HTN hypertension; DM diabetes mellitus; COPD chronic obstructive pulmonary disease

## 2021-09-15 DIAGNOSIS — M25552 Pain in left hip: Secondary | ICD-10-CM | POA: Diagnosis not present

## 2021-10-21 DIAGNOSIS — I129 Hypertensive chronic kidney disease with stage 1 through stage 4 chronic kidney disease, or unspecified chronic kidney disease: Secondary | ICD-10-CM | POA: Diagnosis not present

## 2021-10-21 DIAGNOSIS — I701 Atherosclerosis of renal artery: Secondary | ICD-10-CM | POA: Diagnosis not present

## 2021-10-21 DIAGNOSIS — Z905 Acquired absence of kidney: Secondary | ICD-10-CM | POA: Diagnosis not present

## 2021-10-21 DIAGNOSIS — M109 Gout, unspecified: Secondary | ICD-10-CM | POA: Diagnosis not present

## 2021-10-21 DIAGNOSIS — N1832 Chronic kidney disease, stage 3b: Secondary | ICD-10-CM | POA: Diagnosis not present

## 2021-10-21 LAB — BASIC METABOLIC PANEL
BUN: 30 — AB (ref 4–21)
CO2: 25 — AB (ref 13–22)
Chloride: 100 (ref 99–108)
Creatinine: 1.4 — AB (ref 0.5–1.1)
Glucose: 101
Potassium: 3.8 mEq/L (ref 3.5–5.1)
Sodium: 140 (ref 137–147)

## 2021-10-21 LAB — COMPREHENSIVE METABOLIC PANEL
Calcium: 9.9 (ref 8.7–10.7)
eGFR: 39

## 2021-10-22 LAB — PROTEIN / CREATININE RATIO, URINE: Creatinine, Urine: 41.1

## 2021-10-23 ENCOUNTER — Other Ambulatory Visit: Payer: Self-pay | Admitting: Internal Medicine

## 2021-10-28 ENCOUNTER — Encounter: Payer: Self-pay | Admitting: Internal Medicine

## 2021-11-05 DIAGNOSIS — N1832 Chronic kidney disease, stage 3b: Secondary | ICD-10-CM | POA: Diagnosis not present

## 2021-11-07 ENCOUNTER — Encounter: Payer: Medicare Other | Admitting: Nurse Practitioner

## 2021-11-07 DIAGNOSIS — R220 Localized swelling, mass and lump, head: Secondary | ICD-10-CM

## 2021-11-07 NOTE — Progress Notes (Signed)
Virtual Visit Consent   Charlotte Duarte, you are scheduled for a virtual visit with a [facility] provider today. Just as with appointments in the office, your consent must be obtained to participate. Your consent will be active for this visit and any virtual visit you may have with one of our providers in the next 365 days. If you have a MyChart account, a copy of this consent can be sent to you electronically.  As this is a virtual visit, video technology does not allow for your provider to perform a traditional examination. This may limit your provider's ability to fully assess your condition. If your provider identifies any concerns that need to be evaluated in person or the need to arrange testing (such as labs, EKG, etc.), we will make arrangements to do so. Although advances in technology are sophisticated, we cannot ensure that it will always work on either your end or our end. If the connection with a video visit is poor, the visit may have to be switched to a telephone visit. With either a video or telephone visit, we are not always able to ensure that we have a secure connection.  By engaging in this virtual visit, you consent to the provision of healthcare and authorize for your insurance to be billed (if applicable) for the services provided during this visit. Depending on your insurance coverage, you may receive a charge related to this service.  I need to obtain your verbal consent now. Are you willing to proceed with your visit today? Charlotte Duarte has provided verbal consent on 11/07/2021 for a virtual visit (video or telephone). Charlotte Pounds, NP  Date: 11/07/2021 5:47 PM  Virtual Visit via Video Note   I, Charlotte Duarte, connected with  Charlotte Duarte  ([id number], Dec 03, 1940) on 11/07/21 at  5:15 PM EDT by a video-enabled telemedicine application and verified that I am speaking with the correct person using two identifiers.  Location: Patient: Virtual Visit Location Patient:  Home Provider: Virtual Visit Location Provider: Home Office   I discussed the limitations of evaluation and management by telemedicine and the availability of in person appointments. The patient expressed understanding and agreed to proceed.    History of Present Illness: Charlotte Duarte is a 82 y.o. who identifies as a female who was assigned female at birth, and is being seen today for right jaw swelling.  Charlotte Duarte notes onset of right jaw swelling today. There are no associated factors. Denies lymphadenopathy, ear pain, fever, visual disturbances, dental pain or difficulty swallowing. Had routine dental exam a week ago with no abnormal findings. She does state the area is warm to touch however there is no visible erythema or signs of cellulitis. She has been outside gardening but does believe she was bitten by an insect and there is no pruritis present.    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   Kidney disease 10/31/2018   Hiatal hernia 10/31/2018   Reduced libido 10/31/2018  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   Essential hypertension 03/29/2015   Spinal stenosis 09/20/2011   History of renal artery stenosis 09/20/2011   Allergic rhinitis 09/20/2011   GE reflux 02/21/2011   Atrophic vaginitis    SUI (stress urinary incontinence, female)    Hyperlipidemia 07/27/2010   RENAL DISEASE, CHRONIC, STAGE III  04/20/2009    Allergies:  Allergies  Allergen Reactions   Amoxicillin Anaphylaxis   Keflex [Cephalexin] Hives and Itching    "Uncontrollable itching"    Augmentin [Amoxicillin-Pot Clavulanate] Nausea And Vomiting    Has patient had a PCN reaction causing immediate rash, facial/tongue/throat swelling, SOB or lightheadedness with hypotension: No Has patient had a PCN reaction causing severe rash involving mucus membranes or skin necrosis: No Has patient had a PCN reaction that required hospitalization: No Has patient had a PCN reaction occurring within the last 10 years: No If all of the above answers are "NO", then may proceed with Cephalosporin use.    Oseltamivir Nausea Only and Nausea And Vomiting   Tamiflu [Oseltamivir Phosphate] Nausea And Vomiting   Medications:  Current Outpatient Medications:    allopurinol (ZYLOPRIM) 100 MG tablet, TAKE 1 TABLET BY MOUTH DAILY, Disp: 100 tablet, Rfl: 2   aspirin EC 81 MG tablet, Take 81 mg by mouth daily., Disp: , Rfl:    esomeprazole (NEXIUM) 20 MG packet, Take 20 mg by mouth daily before breakfast., Disp: , Rfl:    fexofenadine (ALLEGRA) 180 MG tablet, Take 180 mg by mouth daily. Reported on 04/09/2015, Disp: , Rfl:    fluticasone (VERAMYST) 27.5 MCG/SPRAY nasal spray, , Disp: , Rfl:    meclizine (ANTIVERT) 25 MG tablet, Take 1 tablet (25 mg total) by mouth 3 (three) times daily as needed for dizziness., Disp: 30 tablet, Rfl: 0   Multiple Vitamins-Minerals (PRESERVISION AREDS) TABS, , Disp: , Rfl:    rosuvastatin (CRESTOR) 5 MG tablet, TAKE 1 TABLET BY MOUTH  DAILY, Disp: 90 tablet, Rfl: 3   sulfamethoxazole-trimethoprim (BACTRIM DS) 800-160 MG tablet, Take 1 tablet by mouth 2 (two) times daily., Disp: 10 tablet, Rfl: 0   triamterene-hydrochlorothiazide (MAXZIDE) 75-50 MG tablet, TAKE 1 TABLET BY MOUTH  DAILY WITH BREAKFAST, Disp: 90 tablet, Rfl: 3   UNABLE TO FIND, Med Name: CBD gummy, Disp: , Rfl:    UNABLE TO FIND, Med Name: Sleep eaze,  Disp: , Rfl:   Observations/Objective: Patient is well-developed, well-nourished in no acute distress.  Resting comfortably at home.  Head is normocephalic, atraumatic.  No labored breathing.  Speech is clear and coherent with logical content.  Patient is alert and oriented at baseline.  Right jaw with mild swelling. No periorbital edema or erythema visualized.   Assessment and Plan: 1. Mandibular swelling Apply cold compresses to the area as needed. She can not take NSAIDS per patient.  Follow up in person visit if swelling does not improve in 4-5 days.  Follow Up Instructions: I discussed the assessment and treatment plan with the patient. The patient was provided an opportunity to ask questions and all were answered. The patient agreed with the plan and demonstrated an understanding of the instructions.  A copy of instructions were sent to the patient via MyChart unless otherwise noted below.   The patient was advised to call back or seek an in-person evaluation if the symptoms worsen or if the condition fails to improve as anticipated.  Time:  I spent 5 minutes with the patient via telehealth technology discussing the above problems/concerns.  Charlotte Pounds, NP

## 2021-11-07 NOTE — Patient Instructions (Addendum)
  Scharlene Corn, thank you for joining Gildardo Pounds, NP for today's virtual visit.  While this provider is not your primary care provider (PCP), if your PCP is located in our provider database this encounter information will be shared with them immediately following your visit.  Consent: (Patient) Charlotte Duarte provided verbal consent for this virtual visit at the beginning of the encounter.  Current Medications:  Current Outpatient Medications:    allopurinol (ZYLOPRIM) 100 MG tablet, TAKE 1 TABLET BY MOUTH DAILY, Disp: 100 tablet, Rfl: 2   aspirin EC 81 MG tablet, Take 81 mg by mouth daily., Disp: , Rfl:    esomeprazole (NEXIUM) 20 MG packet, Take 20 mg by mouth daily before breakfast., Disp: , Rfl:    fexofenadine (ALLEGRA) 180 MG tablet, Take 180 mg by mouth daily. Reported on 04/09/2015, Disp: , Rfl:    fluticasone (VERAMYST) 27.5 MCG/SPRAY nasal spray, , Disp: , Rfl:    meclizine (ANTIVERT) 25 MG tablet, Take 1 tablet (25 mg total) by mouth 3 (three) times daily as needed for dizziness., Disp: 30 tablet, Rfl: 0   Multiple Vitamins-Minerals (PRESERVISION AREDS) TABS, , Disp: , Rfl:    rosuvastatin (CRESTOR) 5 MG tablet, TAKE 1 TABLET BY MOUTH  DAILY, Disp: 90 tablet, Rfl: 3   sulfamethoxazole-trimethoprim (BACTRIM DS) 800-160 MG tablet, Take 1 tablet by mouth 2 (two) times daily., Disp: 10 tablet, Rfl: 0   triamterene-hydrochlorothiazide (MAXZIDE) 75-50 MG tablet, TAKE 1 TABLET BY MOUTH  DAILY WITH BREAKFAST, Disp: 90 tablet, Rfl: 3   UNABLE TO FIND, Med Name: CBD gummy, Disp: , Rfl:    UNABLE TO FIND, Med Name: Sleep eaze, Disp: , Rfl:    Medications ordered in this encounter:  No orders of the defined types were placed in this encounter.    *If you need refills on other medications prior to your next appointment, please contact your pharmacy*  Follow-Up: Call back or seek an in-person evaluation if the symptoms worsen or if the condition fails to improve as anticipated.  Other  Instructions Follow up with PCP if no improvement in 4-5 days  NO CHARGE FOR VISIT   If you have been instructed to have an in-person evaluation today at a local Urgent Care facility, please use the link below. It will take you to a list of all of our available [facility], including address, phone number and hours of operation. Please do not delay care.  [facility]  If you or a family member do not have a primary care provider, use the link below to schedule a visit and establish care. When you choose a [facility] primary care physician or advanced practice provider, you gain a long-term partner in health. Find a Primary Care Provider  Learn more about [facility]'s in-office and virtual care options: [facility]

## 2021-11-10 DIAGNOSIS — M25552 Pain in left hip: Secondary | ICD-10-CM | POA: Diagnosis not present

## 2021-11-12 DIAGNOSIS — M25552 Pain in left hip: Secondary | ICD-10-CM | POA: Diagnosis not present

## 2021-11-15 DIAGNOSIS — M25552 Pain in left hip: Secondary | ICD-10-CM | POA: Diagnosis not present

## 2021-11-17 DIAGNOSIS — M25552 Pain in left hip: Secondary | ICD-10-CM | POA: Diagnosis not present

## 2021-11-19 DIAGNOSIS — M25552 Pain in left hip: Secondary | ICD-10-CM | POA: Diagnosis not present

## 2021-12-08 DIAGNOSIS — M25552 Pain in left hip: Secondary | ICD-10-CM | POA: Diagnosis not present

## 2021-12-13 DIAGNOSIS — D044 Carcinoma in situ of skin of scalp and neck: Secondary | ICD-10-CM | POA: Diagnosis not present

## 2021-12-13 DIAGNOSIS — L578 Other skin changes due to chronic exposure to nonionizing radiation: Secondary | ICD-10-CM | POA: Diagnosis not present

## 2021-12-13 DIAGNOSIS — L570 Actinic keratosis: Secondary | ICD-10-CM | POA: Diagnosis not present

## 2021-12-13 DIAGNOSIS — L821 Other seborrheic keratosis: Secondary | ICD-10-CM | POA: Diagnosis not present

## 2021-12-13 DIAGNOSIS — L723 Sebaceous cyst: Secondary | ICD-10-CM | POA: Diagnosis not present

## 2021-12-13 DIAGNOSIS — D1801 Hemangioma of skin and subcutaneous tissue: Secondary | ICD-10-CM | POA: Diagnosis not present

## 2021-12-15 DIAGNOSIS — M25552 Pain in left hip: Secondary | ICD-10-CM | POA: Diagnosis not present

## 2021-12-16 ENCOUNTER — Telehealth: Payer: Self-pay | Admitting: Internal Medicine

## 2021-12-16 NOTE — Telephone Encounter (Signed)
scheduled

## 2021-12-16 NOTE — Telephone Encounter (Signed)
Patient called and said the nipple on her right breast is inverted and she said its been like that for a couple of months and shes not sure what she should do. Call back is [phone]

## 2021-12-20 DIAGNOSIS — M25552 Pain in left hip: Secondary | ICD-10-CM | POA: Diagnosis not present

## 2021-12-21 ENCOUNTER — Ambulatory Visit (INDEPENDENT_AMBULATORY_CARE_PROVIDER_SITE_OTHER): Payer: Medicare Other | Admitting: Internal Medicine

## 2021-12-21 ENCOUNTER — Encounter: Payer: Self-pay | Admitting: Internal Medicine

## 2021-12-21 VITALS — BP 130/84 | HR 58 | Temp 98.0°F | Ht 59.75 in | Wt 150.8 lb

## 2021-12-21 DIAGNOSIS — I10 Essential (primary) hypertension: Secondary | ICD-10-CM

## 2021-12-21 DIAGNOSIS — N6459 Other signs and symptoms in breast: Secondary | ICD-10-CM | POA: Diagnosis not present

## 2021-12-21 NOTE — Patient Instructions (Addendum)
Nipple area was cleaned with alcohol swab and improved.  She may continue to monitor this and use over-the-counter antibiotic cream/ointment once daily for just a few days.  Call back if  you I have further concerns.  No masses appreciated today on breast exam.  Mammogram is due at [facility] in January 2024.

## 2021-12-21 NOTE — Progress Notes (Signed)
   Subjective:    Patient ID: Charlotte Duarte, female    DOB: 07-Mar-1940, 81 y.o.   MRN: [id number]  HPI Pleasant 81 year old Female seen for concern about her right nipple.  There is family history of breast cancer in her mother.  Patient has no history of abnormal mammograms.  Last mammogram was done at [facility] in January 2023 and was normal.  Recently she noted some inversion in her right nipple..  Thinks it may have been like this for couple of months.  She is concerned.  Has not noticed any drainage or bleeding from the nipple.  Has not felt any masses.  She has a history of chronic kidney disease stage III, hypertension, renal artery stenosis status post left renal artery PTCA and stenting in 2005 with PCI with in-stent stenosis in 2007.  History of hypertension, hyperlipidemia, allergic rhinitis and GE reflux.  History of lumbar spinal stenosis.  Carpal tunnel release 1993.  Arthroscopic surgery of both knees 1990 in 1995 by Dr. Noemi Chapel.  History of bilateral sensorineural hearing loss.  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  Right hip arthroplasty by Dr. Ninfa Linden in 2013.  Left carpal tunnel release by Dr. Burney Gauze 2021 and also had left thumb triggering requiring A1 pulley release.  She had breast augmentation in 1988 and had implant removal in 2007.   Review of Systems see above-no drainage from the nipple, no redness of the breast or nipple.  No tenderness.     Objective:   Physical Exam Vital signs reviewed and are stable. She is anxious and blood pressure is elevated on arrival at 144/80 and was repeated and was normal at 130/84.  Pulse 58 and regular.  She is afebrile.  Weight is 150 pounds 12.8 ounces. Right breast on inspection appears to be slightly inverted.  However on inspection there was some debris within the nipple area.  It was cleaned with alcohol swab and nipple seem to return to a near normal study without inversion.  There is no mass appreciated in the right  breast.  No drainage from the right nipple.     Assessment & Plan:  Due to inversion of right nipple.  Area was cleaned with alcohol swab and inversion seem to improve.  Plan: Patient will continue to monitor the area and may apply over-the-counter antibiotic ointment sparingly- not to keep it to wet however.  It is good to clean it for few days with alcohol once a day.  She will call me if she has any further concerns.

## 2021-12-22 DIAGNOSIS — M25552 Pain in left hip: Secondary | ICD-10-CM | POA: Diagnosis not present

## 2021-12-31 DIAGNOSIS — M25552 Pain in left hip: Secondary | ICD-10-CM | POA: Diagnosis not present

## 2022-01-05 DIAGNOSIS — M25552 Pain in left hip: Secondary | ICD-10-CM | POA: Diagnosis not present

## 2022-01-07 DIAGNOSIS — M25552 Pain in left hip: Secondary | ICD-10-CM | POA: Diagnosis not present

## 2022-01-11 DIAGNOSIS — M25552 Pain in left hip: Secondary | ICD-10-CM | POA: Diagnosis not present

## 2022-01-14 DIAGNOSIS — M25552 Pain in left hip: Secondary | ICD-10-CM | POA: Diagnosis not present

## 2022-01-19 DIAGNOSIS — M25552 Pain in left hip: Secondary | ICD-10-CM | POA: Diagnosis not present

## 2022-01-21 DIAGNOSIS — M25552 Pain in left hip: Secondary | ICD-10-CM | POA: Diagnosis not present

## 2022-01-25 DIAGNOSIS — M25552 Pain in left hip: Secondary | ICD-10-CM | POA: Diagnosis not present

## 2022-01-27 DIAGNOSIS — M25552 Pain in left hip: Secondary | ICD-10-CM | POA: Diagnosis not present

## 2022-02-01 DIAGNOSIS — M25552 Pain in left hip: Secondary | ICD-10-CM | POA: Diagnosis not present

## 2022-02-03 DIAGNOSIS — M25552 Pain in left hip: Secondary | ICD-10-CM | POA: Diagnosis not present

## 2022-02-10 DIAGNOSIS — M25552 Pain in left hip: Secondary | ICD-10-CM | POA: Diagnosis not present

## 2022-02-22 DIAGNOSIS — M25552 Pain in left hip: Secondary | ICD-10-CM | POA: Diagnosis not present

## 2022-03-14 ENCOUNTER — Encounter (INDEPENDENT_AMBULATORY_CARE_PROVIDER_SITE_OTHER): Payer: Self-pay

## 2022-03-14 ENCOUNTER — Encounter (INDEPENDENT_AMBULATORY_CARE_PROVIDER_SITE_OTHER): Payer: Medicare Other | Admitting: Ophthalmology

## 2022-03-16 DIAGNOSIS — Z1231 Encounter for screening mammogram for malignant neoplasm of breast: Secondary | ICD-10-CM | POA: Diagnosis not present

## 2022-03-16 LAB — HM MAMMOGRAPHY

## 2022-03-18 ENCOUNTER — Encounter: Payer: Self-pay | Admitting: Internal Medicine

## 2022-03-21 DIAGNOSIS — L570 Actinic keratosis: Secondary | ICD-10-CM | POA: Diagnosis not present

## 2022-03-21 DIAGNOSIS — L578 Other skin changes due to chronic exposure to nonionizing radiation: Secondary | ICD-10-CM | POA: Diagnosis not present

## 2022-04-04 DIAGNOSIS — M25551 Pain in right hip: Secondary | ICD-10-CM | POA: Diagnosis not present

## 2022-04-06 DIAGNOSIS — M25551 Pain in right hip: Secondary | ICD-10-CM | POA: Diagnosis not present

## 2022-04-11 DIAGNOSIS — M47816 Spondylosis without myelopathy or radiculopathy, lumbar region: Secondary | ICD-10-CM | POA: Diagnosis not present

## 2022-04-27 ENCOUNTER — Encounter (INDEPENDENT_AMBULATORY_CARE_PROVIDER_SITE_OTHER): Payer: Medicare Other | Admitting: Ophthalmology

## 2022-04-27 DIAGNOSIS — I10 Essential (primary) hypertension: Secondary | ICD-10-CM

## 2022-04-27 DIAGNOSIS — H3554 Dystrophies primarily involving the retinal pigment epithelium: Secondary | ICD-10-CM

## 2022-04-27 DIAGNOSIS — H35033 Hypertensive retinopathy, bilateral: Secondary | ICD-10-CM | POA: Diagnosis not present

## 2022-04-27 DIAGNOSIS — H353132 Nonexudative age-related macular degeneration, bilateral, intermediate dry stage: Secondary | ICD-10-CM

## 2022-04-27 DIAGNOSIS — H43812 Vitreous degeneration, left eye: Secondary | ICD-10-CM

## 2022-05-07 DIAGNOSIS — M5416 Radiculopathy, lumbar region: Secondary | ICD-10-CM | POA: Diagnosis not present

## 2022-05-20 ENCOUNTER — Other Ambulatory Visit: Payer: Self-pay | Admitting: Internal Medicine

## 2022-05-27 DIAGNOSIS — M109 Gout, unspecified: Secondary | ICD-10-CM | POA: Diagnosis not present

## 2022-05-27 DIAGNOSIS — N1832 Chronic kidney disease, stage 3b: Secondary | ICD-10-CM | POA: Diagnosis not present

## 2022-05-27 DIAGNOSIS — Z905 Acquired absence of kidney: Secondary | ICD-10-CM | POA: Diagnosis not present

## 2022-05-27 DIAGNOSIS — I129 Hypertensive chronic kidney disease with stage 1 through stage 4 chronic kidney disease, or unspecified chronic kidney disease: Secondary | ICD-10-CM | POA: Diagnosis not present

## 2022-05-27 LAB — BASIC METABOLIC PANEL
BUN: 29 — AB (ref 4–21)
CO2: 26 — AB (ref 13–22)
Chloride: 103 (ref 99–108)
Creatinine: 1.2 — AB (ref 0.5–1.1)
Glucose: 89
Potassium: 4 mEq/L (ref 3.5–5.1)
Sodium: 140 (ref 137–147)

## 2022-05-27 LAB — COMPREHENSIVE METABOLIC PANEL
Albumin: 4.3 (ref 3.5–5.0)
Calcium: 9.8 (ref 8.7–10.7)
eGFR: 47

## 2022-05-27 LAB — CBC AND DIFFERENTIAL
HCT: 38 (ref 36–46)
Hemoglobin: 12.6 (ref 12.0–16.0)
Neutrophils Absolute: 4
Platelets: 293 10*3/uL (ref 150–400)
WBC: 6.1

## 2022-05-27 LAB — CBC: RBC: 4.57 (ref 3.87–5.11)

## 2022-06-08 ENCOUNTER — Encounter: Payer: Self-pay | Admitting: Internal Medicine

## 2022-06-13 DIAGNOSIS — M5416 Radiculopathy, lumbar region: Secondary | ICD-10-CM | POA: Diagnosis not present

## 2022-06-16 ENCOUNTER — Other Ambulatory Visit: Payer: Medicare Other

## 2022-06-16 DIAGNOSIS — R5383 Other fatigue: Secondary | ICD-10-CM | POA: Diagnosis not present

## 2022-06-16 DIAGNOSIS — R7302 Impaired glucose tolerance (oral): Secondary | ICD-10-CM | POA: Diagnosis not present

## 2022-06-16 DIAGNOSIS — I10 Essential (primary) hypertension: Secondary | ICD-10-CM | POA: Diagnosis not present

## 2022-06-16 DIAGNOSIS — E7800 Pure hypercholesterolemia, unspecified: Secondary | ICD-10-CM

## 2022-06-16 LAB — CBC WITH DIFFERENTIAL/PLATELET
Eosinophils Relative: 5.2 %
Hemoglobin: 12.9 g/dL (ref 11.7–15.5)
Lymphs Abs: 1342 cells/uL (ref 850–3900)
MCH: 27.5 pg (ref 27.0–33.0)
MCV: 83.6 fL (ref 80.0–100.0)
Platelets: 317 10*3/uL (ref 140–400)
RDW: 15.9 % — ABNORMAL HIGH (ref 11.0–15.0)
Total Lymphocyte: 18.9 %
WBC: 7.1 10*3/uL (ref 3.8–10.8)

## 2022-06-16 NOTE — Progress Notes (Signed)
Annual Wellness Visit    Patient Care Team: Kailie Polus, Luanna Cole, MD as PCP - General (Internal Medicine) Camille Bal, MD as Consulting Physician (Nephrology) Gaylord Shih, Emerge (Specialist) Suzi Roots as Physician Assistant (Dermatology)  Visit Date: 06/16/22   No chief complaint on file.   Subjective:   Patient: Charlotte Duarte, Female    DOB: 10-02-1940, 82 y.o.   MRN: [id number]  Charlotte Duarte is a 82 y.o. Female who presents today for her Annual Wellness Visit. She has a history of allergies, arthritis, atrophic vaginitis, cataract, degenerative arthritis of hip 2013, GERD, hiatal hernia, hepatitis, hyperlipidemia, hypertension, renal artery stenosis.  History of renal artery stenosis treated with allopurinol 100 mg daily.  History of GERD treated with esomeprazole 20 mg daily before breakfast.  History of hypertension treated with triamterene-hydrochlorothiazide 75-50 mg daily with breakfast.  History of hyperlipidemia treated with rosuvastatin 5 mg daily.  Mammogram last completed 03/16/22. No mammographic evidence of malignancy. Recommended repeat in 2024.  Negative Cologuard 2021.  DEXA scan last completed 08/02/16. Bone density is normal.  CT coronary calcium score at 0 on 09/06/21.   Vaccine counseling: UTD on flu, tetanus, pneumonia, shingles vaccines.  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  Physical Exam   Most recent functional status assessment:    06/17/2021    1:33 PM  In your present state of health, do you have any difficulty performing the following activities:  Hearing? 0  Vision? 0  Difficulty concentrating or making decisions? 0  Walking or climbing stairs? 0  Dressing or bathing? 0  Doing errands, shopping? 0  Preparing Food and eating ? N  Using the Toilet? N  In the past six months, have you accidently leaked urine? N  Do you have problems with loss of bowel control? N  Managing your Medications? N  Managing your Finances? N  Housekeeping or managing your Housekeeping? N   Most recent fall risk assessment:    12/21/2021    2:30 PM  Fall Risk   Falls in the past year? 0  Number falls in past yr: 0  Injury with Fall? 0  Risk for fall due to : No Fall Risks  Follow up Falls evaluation completed    Most recent depression screenings:    12/21/2021    2:30 PM 06/17/2021    1:32 PM  PHQ 2/9 Scores  PHQ - 2 Score 0 0   Most recent cognitive screening:    06/17/2021    1:33 PM  6CIT Screen  What Year? 0 points  What month? 0 points  What time? 0 points  Count back from 20 0 points  Months in reverse 0 points  Repeat phrase 0 points  Total Score 0 points     Results:   Studies obtained and personally reviewed by me:  Imaging, colonoscopy, mammogram, bone density scan, echocardiogram, heart cath, stress test, CT calcium score, etc. ***   Labs:       Component Value Date/Time   NA 140 05/27/2022 0000   K 4.0 05/27/2022 0000   CL 103 05/27/2022 0000   CO2 26 (A) 05/27/2022 0000   GLUCOSE 85 05/21/2021 0947   BUN 29 (A) 05/27/2022 0000   CREATININE 1.2 (A) 05/27/2022 0000   CREATININE 1.29 (H) 05/21/2021 0947   CALCIUM 9.8 05/27/2022 0000   PROT 6.9 05/21/2021 0947   ALBUMIN 4.3 05/27/2022 0000   AST 25 05/21/2021 0947   ALT 20  05/21/2021 0947   ALKPHOS 40 03/28/2015 0536   BILITOT 0.6 05/21/2021 0947   GFRNONAA 37 (L) 03/12/2020 1111   GFRAA 43 (L) 03/12/2020 1111     Lab Results  Component Value Date   WBC 6.1 05/27/2022   HGB 12.6 05/27/2022   HCT 38 05/27/2022   MCV 86.8 05/21/2021   PLT 293 05/27/2022    Lab Results  Component Value Date   CHOL 175 05/21/2021   HDL 54 05/21/2021   LDLCALC 101 (H) 05/21/2021   TRIG 102 05/21/2021   CHOLHDL 3.2 05/21/2021    Lab Results  Component Value Date   HGBA1C 6.2 (H) 05/21/2021     Lab Results  Component Value Date   TSH 2.52 05/21/2021     No results found for: "PSA1", "PSA" *** delete for female pts  ***  Assessment & Plan:   ***      Annual wellness visit done today including the all of the following: Reviewed patient's Family Medical History Reviewed and updated list of patient's medical providers Assessment of cognitive impairment was done Assessed patient's functional ability Established a written schedule for health screening services Health Risk Assessent Completed and Reviewed  Discussed health benefits of physical activity, and encouraged her to engage in regular exercise appropriate for her age and condition.        I,Alexander Ruley,acting as a Neurosurgeon for Margaree Mackintosh, MD.,have documented all relevant documentation on the behalf of Margaree Mackintosh, MD,as directed by  Margaree Mackintosh, MD while in the presence of Margaree Mackintosh, MD.   ***

## 2022-06-17 LAB — HEMOGLOBIN A1C
Hgb A1c MFr Bld: 6.2 % of total Hgb — ABNORMAL HIGH (ref <5.7)
Mean Plasma Glucose: 131 mg/dL
eAG (mmol/L): 7.3 mmol/L

## 2022-06-17 LAB — MICROALBUMIN / CREATININE URINE RATIO
Creatinine, Urine: 92 mg/dL (ref 20–275)
Microalb Creat Ratio: 52 mg/g creat — ABNORMAL HIGH (ref <30)
Microalb, Ur: 4.8 mg/dL

## 2022-06-17 LAB — CBC WITH DIFFERENTIAL/PLATELET
Absolute Monocytes: 533 cells/uL (ref 200–950)
Basophils Absolute: 57 cells/uL (ref 0–200)
Basophils Relative: 0.8 %
Eosinophils Absolute: 369 cells/uL (ref 15–500)
HCT: 39.2 % (ref 35.0–45.0)
MCHC: 32.9 g/dL (ref 32.0–36.0)
MPV: 10.2 fL (ref 7.5–12.5)
Monocytes Relative: 7.5 %
Neutro Abs: 4800 cells/uL (ref 1500–7800)
Neutrophils Relative %: 67.6 %
RBC: 4.69 10*6/uL (ref 3.80–5.10)

## 2022-06-17 LAB — LIPID PANEL
Cholesterol: 165 mg/dL (ref <200)
HDL: 49 mg/dL — ABNORMAL LOW (ref >=50)
LDL Cholesterol (Calc): 89 mg/dL (calc)
Non-HDL Cholesterol (Calc): 116 mg/dL (calc) (ref <130)
Total CHOL/HDL Ratio: 3.4 (calc) (ref <5.0)
Triglycerides: 175 mg/dL — ABNORMAL HIGH (ref <150)

## 2022-06-17 LAB — COMPLETE METABOLIC PANEL WITH GFR
AG Ratio: 2.2 (calc) (ref 1.0–2.5)
ALT: 19 U/L (ref 6–29)
AST: 24 U/L (ref 10–35)
Albumin: 4.6 g/dL (ref 3.6–5.1)
Alkaline phosphatase (APISO): 77 U/L (ref 37–153)
BUN/Creatinine Ratio: 26 (calc) — ABNORMAL HIGH (ref 6–22)
BUN: 33 mg/dL — ABNORMAL HIGH (ref 7–25)
CO2: 26 mmol/L (ref 20–32)
Calcium: 9.8 mg/dL (ref 8.6–10.4)
Chloride: 103 mmol/L (ref 98–110)
Creat: 1.25 mg/dL — ABNORMAL HIGH (ref 0.60–0.95)
Globulin: 2.1 g/dL (calc) (ref 1.9–3.7)
Glucose, Bld: 96 mg/dL (ref 65–99)
Potassium: 4.2 mmol/L (ref 3.5–5.3)
Sodium: 140 mmol/L (ref 135–146)
Total Bilirubin: 0.4 mg/dL (ref 0.2–1.2)
Total Protein: 6.7 g/dL (ref 6.1–8.1)
eGFR: 44 mL/min/{1.73_m2} — ABNORMAL LOW (ref >=60)

## 2022-06-17 LAB — TSH: TSH: 2.43 mIU/L (ref 0.40–4.50)

## 2022-06-22 DIAGNOSIS — M5416 Radiculopathy, lumbar region: Secondary | ICD-10-CM | POA: Diagnosis not present

## 2022-06-23 ENCOUNTER — Ambulatory Visit (INDEPENDENT_AMBULATORY_CARE_PROVIDER_SITE_OTHER): Payer: Medicare Other | Admitting: Internal Medicine

## 2022-06-23 ENCOUNTER — Encounter: Payer: Self-pay | Admitting: Internal Medicine

## 2022-06-23 VITALS — BP 114/72 | HR 80 | Temp 97.9°F | Ht 59.5 in | Wt 150.4 lb

## 2022-06-23 DIAGNOSIS — R3989 Other symptoms and signs involving the genitourinary system: Secondary | ICD-10-CM

## 2022-06-23 DIAGNOSIS — Z8709 Personal history of other diseases of the respiratory system: Secondary | ICD-10-CM

## 2022-06-23 DIAGNOSIS — I10 Essential (primary) hypertension: Secondary | ICD-10-CM

## 2022-06-23 DIAGNOSIS — Z803 Family history of malignant neoplasm of breast: Secondary | ICD-10-CM

## 2022-06-23 DIAGNOSIS — I4510 Unspecified right bundle-branch block: Secondary | ICD-10-CM

## 2022-06-23 DIAGNOSIS — B952 Enterococcus as the cause of diseases classified elsewhere: Secondary | ICD-10-CM

## 2022-06-23 DIAGNOSIS — Z8249 Family history of ischemic heart disease and other diseases of the circulatory system: Secondary | ICD-10-CM | POA: Diagnosis not present

## 2022-06-23 DIAGNOSIS — Z0000 Encounter for general adult medical examination without abnormal findings: Secondary | ICD-10-CM | POA: Diagnosis not present

## 2022-06-23 DIAGNOSIS — E7800 Pure hypercholesterolemia, unspecified: Secondary | ICD-10-CM | POA: Diagnosis not present

## 2022-06-23 DIAGNOSIS — N390 Urinary tract infection, site not specified: Secondary | ICD-10-CM

## 2022-06-23 DIAGNOSIS — R82998 Other abnormal findings in urine: Secondary | ICD-10-CM | POA: Diagnosis not present

## 2022-06-23 DIAGNOSIS — M48061 Spinal stenosis, lumbar region without neurogenic claudication: Secondary | ICD-10-CM

## 2022-06-23 DIAGNOSIS — Z96641 Presence of right artificial hip joint: Secondary | ICD-10-CM

## 2022-06-23 DIAGNOSIS — Z8679 Personal history of other diseases of the circulatory system: Secondary | ICD-10-CM | POA: Diagnosis not present

## 2022-06-23 DIAGNOSIS — Z8719 Personal history of other diseases of the digestive system: Secondary | ICD-10-CM

## 2022-06-23 DIAGNOSIS — I129 Hypertensive chronic kidney disease with stage 1 through stage 4 chronic kidney disease, or unspecified chronic kidney disease: Secondary | ICD-10-CM

## 2022-06-23 DIAGNOSIS — N1831 Chronic kidney disease, stage 3a: Secondary | ICD-10-CM

## 2022-06-23 LAB — POCT URINALYSIS DIPSTICK
Bilirubin, UA: NEGATIVE
Blood, UA: NEGATIVE
Glucose, UA: NEGATIVE
Ketones, UA: NEGATIVE
Nitrite, UA: NEGATIVE
Protein, UA: NEGATIVE
Spec Grav, UA: 1.01 (ref 1.010–1.025)
Urobilinogen, UA: 0.2 E.U./dL
pH, UA: 5 (ref 5.0–8.0)

## 2022-06-23 NOTE — Progress Notes (Signed)
Annual Wellness Visit    Patient Care Team: Saber Dickerman, Luanna Cole, MD as PCP - General (Internal Medicine) Camille Bal, MD as Consulting Physician (Nephrology) Gaylord Shih, Emerge (Specialist) Suzi Roots as Physician Assistant (Dermatology)  Visit Date: 06/23/22   No chief complaint on file.   Subjective:   Patient: Charlotte Duarte, Female    DOB: 12-Jul-1940, 83 y.o.   MRN: [id number]  Charlotte Duarte is a 82 y.o. Female who presents today for her Annual Wellness Visit. 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    History of hypertension treated with triamterene-hydrochlorothiazide 75-50 mg daily with breakfast. Blood pressure normal today at 114/72.  Followed by [facility] and was seen April 2024.  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  Found to have right artery stenosis and went left renal artery PTCA and stenting in 2005 with PCI of an in-stent stenosis in 2007.  Was treated with Plavix for a while but this has been discontinued.. Creatinine stable around 1.25- 1.4.  Gout treated with allopurinol, GE reflux treated with Nexium, she is on low-dose Crestor for hyperlipidemia and Maxide 75 for hypertension.  Takes Allegra for allergic rhinitis and uses Veramyst nasal spray.  History of gout.  No proteinuria.  History of impaired glucose tolerance. HGBA1c at 6.2%. Not currently taking medication for this.   History of hyperlipidemia treated with rosuvastatin 5 mg daily. HDL low at 49, TRIG elevated at 175.   Current estimated GFR is 44.  BUN very slightly elevated at 33, serum creatinine is 1.25. Glucose normal. Liver functions normal. Electrolytes normal. Serum proteins normal. RDW elevated  at 15.9. TSH  normal at 2.43.   Pelvic deferred to Gynecologist.   Mammogram last completed 03/16/22. No mammographic evidence of malignancy. Recommended repeat in 2024.   Negative Cologuard 2021.   DEXA scan last completed 08/02/16. Bone density is normal.   CT coronary calcium score at 0 on 09/06/21.  Breast augmentation in 1980 and had implant removal in 2007.  Arthroscopic surgery of both knees in 1990 in 1995 by Dr. Thurston Hole.  History of epidural steroid injection for lumbar spinal stenosis 2013.  History of right bundle branch block on EKG.  Had carpal tunnel release 1993.  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  She is a vegetarian (lacto-ovo).  She is married.  Has a 4-year college degree.  Has been employed as a Human resources officer.  Family history: Mother died of an acute MI at age 102.  Mother had breast cancer and  an aneurysm in her leg.  Brother with history of brain aneurysm.  Another brother with history of ALS.  Both parents had hypertension.    Review of Systems  Constitutional:  Negative for chills, fever, malaise/fatigue and weight loss.  HENT:  Negative for hearing loss, sinus pain and sore throat.   Respiratory:  Negative for cough, hemoptysis and shortness of breath.   Cardiovascular:  Negative for chest pain, palpitations, leg swelling and PND.  Gastrointestinal:  Negative for abdominal pain, constipation,  diarrhea, heartburn, nausea and vomiting.  Genitourinary:  Negative for dysuria, frequency and urgency.  Musculoskeletal:  Negative for back pain, myalgias and neck pain.  Skin:  Negative for itching and rash.  Neurological:  Negative for dizziness, tingling, seizures and headaches.  Endo/Heme/Allergies:  Negative for polydipsia.  Psychiatric/Behavioral:  Negative for depression. The patient is not nervous/anxious.       Objective:   Vitals: BP 114/72   Pulse 80   Temp 97.9 F (36.6 C) (Tympanic)   Ht 4' 11.5" (1.511 m)   Wt 150 lb 6.4 oz (68.2 kg)   SpO2 96%   BMI 29.87 kg/m   Physical Exam Vitals and nursing note reviewed.  Constitutional:      General: She is not in acute distress.    Appearance: Normal appearance. She is not ill-appearing or toxic-appearing.  HENT:     Head: Normocephalic and atraumatic.     Right Ear: Hearing, tympanic membrane, ear canal and external ear normal.     Left Ear: Hearing, tympanic membrane, ear canal and external ear normal.     Mouth/Throat:     Pharynx: Oropharynx is clear.  Eyes:     Extraocular Movements: Extraocular movements intact.     Pupils: Pupils are equal, round, and reactive to light.  Neck:     Thyroid: No thyroid mass, thyromegaly or thyroid tenderness.     Vascular: No carotid bruit.  Cardiovascular:     Rate and Rhythm: Normal rate and regular rhythm. No extrasystoles are present.    Pulses:          Dorsalis pedis pulses are 1+ on the right side and 1+ on the left side.     Heart sounds: Normal heart sounds. No murmur heard.    No friction rub. No gallop.  Pulmonary:     Effort: Pulmonary effort is normal.     Breath sounds: Normal breath sounds. No decreased breath sounds, wheezing, rhonchi or rales.  Chest:     Chest wall: No mass.  Abdominal:     Palpations: Abdomen is soft. There is no hepatomegaly, splenomegaly or mass.     Tenderness: There is no abdominal tenderness.     Hernia: No hernia is present.   Musculoskeletal:     Cervical back: Normal range of motion.     Right lower leg: No edema.     Left lower leg: No edema.  Lymphadenopathy:     Cervical: No cervical adenopathy.     Upper Body:     Right upper body: No supraclavicular adenopathy.     Left upper body: No supraclavicular adenopathy.  Skin:    General: Skin is warm and dry.  Neurological:     General: No focal deficit present.     Mental Status: She is alert and oriented to person, place, and time. Mental status is at baseline.     Sensory: Sensation is intact.     Motor: Motor function is intact. No weakness.  Deep Tendon Reflexes: Reflexes are normal and symmetric.  Psychiatric:        Attention and Perception: Attention normal.        Mood and Affect: Mood normal.        Speech: Speech normal.        Behavior: Behavior normal.        Thought Content: Thought content normal.        Cognition and Memory: Cognition normal.        Judgment: Judgment normal.      Most recent functional status assessment:    06/23/2022    3:07 PM  In your present state of health, do you have any difficulty performing the following activities:  Hearing? 1  Vision? 1  Difficulty concentrating or making decisions? 0  Walking or climbing stairs? 1  Dressing or bathing? 0  Doing errands, shopping? 0  Preparing Food and eating ? N  Using the Toilet? N  In the past six months, have you accidently leaked urine? N  Do you have problems with loss of bowel control? N  Managing your Medications? N  Managing your Finances? N  Housekeeping or managing your Housekeeping? N   Most recent fall risk assessment:    06/23/2022    3:06 PM  Fall Risk   Falls in the past year? 0  Number falls in past yr: 0  Injury with Fall? 0  Risk for fall due to : Other (Comment)  Follow up Falls prevention discussed    Most recent depression screenings:    06/23/2022    3:06 PM 12/21/2021    2:30 PM  PHQ 2/9 Scores  PHQ - 2 Score 0 0   Most  recent cognitive screening:    06/23/2022    3:09 PM  6CIT Screen  What Year? 0 points  What month? 0 points  What time? 0 points  Count back from 20 0 points  Months in reverse 0 points  Repeat phrase 0 points  Total Score 0 points     Results:   Studies obtained and personally reviewed by me:  Mammogram last completed 03/16/22. No mammographic evidence of malignancy. Recommended repeat in 2024.   DEXA scan last completed 08/02/16. Bone density is normal.   CT coronary calcium score at 0 on 09/06/21.   Labs:       Component Value Date/Time   NA 140 06/16/2022 0935   NA 140 05/27/2022 0000   K 4.2 06/16/2022 0935   CL 103 06/16/2022 0935   CO2 26 06/16/2022 0935   GLUCOSE 96 06/16/2022 0935   BUN 33 (H) 06/16/2022 0935   BUN 29 (A) 05/27/2022 0000   CREATININE 1.25 (H) 06/16/2022 0935   CALCIUM 9.8 06/16/2022 0935   PROT 6.7 06/16/2022 0935   ALBUMIN 4.3 05/27/2022 0000   AST 24 06/16/2022 0935   ALT 19 06/16/2022 0935   ALKPHOS 40 03/28/2015 0536   BILITOT 0.4 06/16/2022 0935   GFRNONAA 37 (L) 03/12/2020 1111   GFRAA 43 (L) 03/12/2020 1111     Lab Results  Component Value Date   WBC 7.1 06/16/2022   HGB 12.9 06/16/2022   HCT 39.2 06/16/2022   MCV 83.6 06/16/2022   PLT 317 06/16/2022    Lab Results  Component Value Date   CHOL 165 06/16/2022   HDL 49 (L) 06/16/2022   LDLCALC 89 06/16/2022   TRIG 175 (H) 06/16/2022   CHOLHDL 3.4 06/16/2022    Lab Results  Component  Value Date   HGBA1C 6.2 (H) 06/16/2022     Lab Results  Component Value Date   TSH 2.43 06/16/2022    Assessment & Plan:   History of renal artery stenosis: History of chronic kidney disease followed by [facility] and stable  History of gout treated with allopurinol   GERD: treated with esomeprazole 20 mg daily before breakfast.   Hypertension: treated with triamterene-hydrochlorothiazide 75-50 mg daily with breakfast. Blood pressure normal today at  114/72.  Impaired glucose tolerance: HGBA1c at 6.2%. Not currently taking medication for this.  This is diet controlled only.  Hyperlipidemia: treated with rosuvastatin 5 mg daily. HDL low at 49, TRIG elevated at 175.   Pelvic deferred to Gynecologist, Dr. Marcelle Overlie.   Mammogram: last completed 03/16/22. No mammographic evidence of malignancy. Recommended repeat in 2024.   Negative Cologuard 2021.   DEXA scan: last completed 08/02/16. Bone density is normal.   CT coronary calcium score at 0 on 09/06/21.  She has an abnormal urine dipstick and culture was sent.  Culture grew Enterococcus faecalis.  Addendum: Patient apparently has started Bactrim on May 4 after she saw her results on MyChart.  She is advised  not to take this.  She is to start Macrobid.  That is because there is no sensitivity reported to Bactrim only ampicillin nitrofurantoin and vancomycin.  We will go with Nitrofurantoin as it is well-tolerated and is sensitive to the organism.  Vaccine counseling: UTD on flu, tetanus, shingles vaccines. She will go to the pharmacy for the pneumococcal 20 vaccine.  She will have annual health maintenance exam and Medicare wellness visit in 1 year but will need follow-up on her UTI after completing 7-day course.     Annual wellness visit done today including the all of the following: Reviewed patient's Family Medical History Reviewed and updated list of patient's medical providers Assessment of cognitive impairment was done Assessed patient's functional ability Established a written schedule for health screening services Health Risk Assessent Completed and Reviewed  Discussed health benefits of physical activity, and encouraged her to engage in regular exercise appropriate for her age and condition.        I,Alexander Ruley,acting as a Neurosurgeon for Charlotte Mackintosh, MD.,have documented all relevant documentation on the behalf of Charlotte Mackintosh, MD,as directed by  Charlotte Mackintosh, MD while  in the presence of Charlotte Mackintosh, MD.   I, Charlotte Mackintosh, MD, have reviewed all documentation for this visit. The documentation on 07/10/22 for the exam, diagnosis, procedures, and orders are all accurate and complete.

## 2022-06-24 DIAGNOSIS — M5416 Radiculopathy, lumbar region: Secondary | ICD-10-CM | POA: Diagnosis not present

## 2022-06-25 LAB — URINE CULTURE
MICRO NUMBER:: 14905152
SPECIMEN QUALITY:: ADEQUATE

## 2022-06-27 ENCOUNTER — Other Ambulatory Visit: Payer: Self-pay

## 2022-06-27 MED ORDER — NITROFURANTOIN MONOHYD MACRO 100 MG PO CAPS: 100.0000 mg | ORAL_CAPSULE | Freq: Two times a day (BID) | ORAL | 0 refills | Status: DC

## 2022-06-28 DIAGNOSIS — M5416 Radiculopathy, lumbar region: Secondary | ICD-10-CM | POA: Diagnosis not present

## 2022-06-30 DIAGNOSIS — M5416 Radiculopathy, lumbar region: Secondary | ICD-10-CM | POA: Diagnosis not present

## 2022-07-04 DIAGNOSIS — M5416 Radiculopathy, lumbar region: Secondary | ICD-10-CM | POA: Diagnosis not present

## 2022-07-08 ENCOUNTER — Ambulatory Visit (INDEPENDENT_AMBULATORY_CARE_PROVIDER_SITE_OTHER): Payer: Medicare Other | Admitting: Internal Medicine

## 2022-07-08 ENCOUNTER — Encounter: Payer: Self-pay | Admitting: Internal Medicine

## 2022-07-08 ENCOUNTER — Telehealth: Payer: Self-pay | Admitting: Internal Medicine

## 2022-07-08 VITALS — BP 136/82 | HR 73 | Temp 98.5°F | Ht 59.5 in | Wt 150.8 lb

## 2022-07-08 DIAGNOSIS — R197 Diarrhea, unspecified: Secondary | ICD-10-CM

## 2022-07-08 DIAGNOSIS — N390 Urinary tract infection, site not specified: Secondary | ICD-10-CM | POA: Diagnosis not present

## 2022-07-08 DIAGNOSIS — I10 Essential (primary) hypertension: Secondary | ICD-10-CM

## 2022-07-08 DIAGNOSIS — N1831 Chronic kidney disease, stage 3a: Secondary | ICD-10-CM

## 2022-07-08 DIAGNOSIS — R3989 Other symptoms and signs involving the genitourinary system: Secondary | ICD-10-CM

## 2022-07-08 LAB — CBC WITH DIFFERENTIAL/PLATELET
Basophils Relative: 0.5 %
Eosinophils Absolute: 306 cells/uL (ref 15–500)
Eosinophils Relative: 3 %
Hemoglobin: 13.1 g/dL (ref 11.7–15.5)
MCH: 28.1 pg (ref 27.0–33.0)
MCV: 85.4 fL (ref 80.0–100.0)
MPV: 11.1 fL (ref 7.5–12.5)
Monocytes Relative: 6.9 %
Neutro Abs: 7752 cells/uL (ref 1500–7800)
RDW: 16.1 % — ABNORMAL HIGH (ref 11.0–15.0)

## 2022-07-08 LAB — HEMOCCULT GUIAC POC 1CARD (OFFICE): Fecal Occult Blood, POC: NEGATIVE

## 2022-07-08 NOTE — Telephone Encounter (Signed)
Charlotte Duarte [id number]  [name] called to say she finished the medicine you gave her on Tuesday or Wednesday and then she started on having really bad diarrhea, cramps and and orange mucus, she did take an one Imodium yesterday and one at 5:00 am this morning.

## 2022-07-08 NOTE — Progress Notes (Signed)
Patient Care Team: Margaree Mackintosh, MD as PCP - General (Internal Medicine) Camille Bal, MD as Consulting Physician (Nephrology) Gaylord Shih, [facility] (Specialist) Suzi Roots as Physician Assistant (Dermatology)  Visit Date: 07/08/22  Subjective:    Patient ID: Charlotte Duarte , Female   DOB: September 02, 1940, 82 y.o.    MRN: [id number]   82 y.o. Female presents today for diarrhea. Previously treated on 06/23/22 for enterococcus faecalis and has now developed diarrhea. She will return to give urine specimen.  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   Hyperlipidemia    Hypertension    Renal artery stenosis (HCC)    SUI (stress urinary incontinence, female)      Family History  Problem Relation Age of Onset   Mental illness Mother    Breast cancer Mother 23   Hypertension Mother    Heart disease Father    ALS Brother    Stroke Brother     Social History   Social History Narrative   Not on file      Review of Systems  Constitutional:  Negative for fever and malaise/fatigue.  HENT:  Negative for congestion.   Eyes:  Negative for blurred vision.  Respiratory:  Negative for cough and shortness of breath.   Cardiovascular:  Negative for chest pain, palpitations and leg swelling.  Gastrointestinal:  Positive for diarrhea. Negative for vomiting.  Musculoskeletal:  Negative for back pain.  Skin:  Negative for rash.  Neurological:  Negative for loss of consciousness and headaches.        Objective:   Vitals: BP 136/82   Pulse 73   Temp 98.5 F (36.9 C) (Tympanic)   Ht 4' 11.5" (1.511 m)   Wt 150 lb 12.8 oz (68.4 kg)   SpO2 95%   BMI 29.95 kg/m    Physical Exam Vitals and nursing note reviewed.  Constitutional:      General: She is not in acute  distress.    Appearance: Normal appearance. She is not toxic-appearing.  HENT:     Head: Normocephalic and atraumatic.  Pulmonary:     Effort: Pulmonary effort is normal.  Abdominal:     General: Abdomen is flat. Bowel sounds are normal. There is no distension.     Palpations: There is no hepatomegaly, splenomegaly or mass.     Tenderness: There is no abdominal tenderness.  Skin:    General: Skin is warm and dry.  Neurological:     Mental Status: She is alert and oriented to person, place, and time. Mental status is at baseline.  Psychiatric:        Mood and Affect: Mood normal.        Behavior: Behavior normal.        Thought Content: Thought content normal.        Judgment: Judgment normal.       Results:   Studies obtained and personally reviewed by me:   Labs:       Component Value Date/Time   NA 140 06/16/2022 0935   NA 140 05/27/2022 0000   K 4.2 06/16/2022 0935   CL 103 06/16/2022 0935   CO2 26 06/16/2022 0935   GLUCOSE 96 06/16/2022 0935  BUN 33 (H) 06/16/2022 0935   BUN 29 (A) 05/27/2022 0000   CREATININE 1.25 (H) 06/16/2022 0935   CALCIUM 9.8 06/16/2022 0935   PROT 6.7 06/16/2022 0935   ALBUMIN 4.3 05/27/2022 0000   AST 24 06/16/2022 0935   ALT 19 06/16/2022 0935   ALKPHOS 40 03/28/2015 0536   BILITOT 0.4 06/16/2022 0935   GFRNONAA 37 (L) 03/12/2020 1111   GFRAA 43 (L) 03/12/2020 1111     Lab Results  Component Value Date   WBC 7.1 06/16/2022   HGB 12.9 06/16/2022   HCT 39.2 06/16/2022   MCV 83.6 06/16/2022   PLT 317 06/16/2022    Lab Results  Component Value Date   CHOL 165 06/16/2022   HDL 49 (L) 06/16/2022   LDLCALC 89 06/16/2022   TRIG 175 (H) 06/16/2022   CHOLHDL 3.4 06/16/2022    Lab Results  Component Value Date   HGBA1C 6.2 (H) 06/16/2022     Lab Results  Component Value Date   TSH 2.43 06/16/2022      Assessment & Plan:   Diarrhea: Ordered CBC with Diff/Plat, CMET, C-diff fecal culture, ova and parasite exam. Stool  negative for occult blood. Can take Imodium. Advance diet slowly.  Return to give urine specimen.    I,Alexander Ruley,acting as a Neurosurgeon for Margaree Mackintosh, MD.,have documented all relevant documentation on the behalf of Margaree Mackintosh, MD,as directed by  Margaree Mackintosh, MD while in the presence of Margaree Mackintosh, MD.   ***

## 2022-07-09 LAB — COMPLETE METABOLIC PANEL WITH GFR
AG Ratio: 1.8 (calc) (ref 1.0–2.5)
ALT: 19 U/L (ref 6–29)
AST: 25 U/L (ref 10–35)
Albumin: 4.6 g/dL (ref 3.6–5.1)
Alkaline phosphatase (APISO): 84 U/L (ref 37–153)
BUN/Creatinine Ratio: 21 (calc) (ref 6–22)
BUN: 30 mg/dL — ABNORMAL HIGH (ref 7–25)
CO2: 26 mmol/L (ref 20–32)
Calcium: 9.9 mg/dL (ref 8.6–10.4)
Chloride: 101 mmol/L (ref 98–110)
Creat: 1.41 mg/dL — ABNORMAL HIGH (ref 0.60–0.95)
Globulin: 2.5 g/dL (calc) (ref 1.9–3.7)
Glucose, Bld: 134 mg/dL — ABNORMAL HIGH (ref 65–99)
Potassium: 3.7 mmol/L (ref 3.5–5.3)
Sodium: 138 mmol/L (ref 135–146)
Total Bilirubin: 0.5 mg/dL (ref 0.2–1.2)
Total Protein: 7.1 g/dL (ref 6.1–8.1)
eGFR: 38 mL/min/{1.73_m2} — ABNORMAL LOW (ref >=60)

## 2022-07-09 LAB — CBC WITH DIFFERENTIAL/PLATELET
Absolute Monocytes: 704 cells/uL (ref 200–950)
Basophils Absolute: 51 cells/uL (ref 0–200)
HCT: 39.9 % (ref 35.0–45.0)
Lymphs Abs: 1387 cells/uL (ref 850–3900)
MCHC: 32.8 g/dL (ref 32.0–36.0)
Neutrophils Relative %: 76 %
Platelets: 323 10*3/uL (ref 140–400)
RBC: 4.67 10*6/uL (ref 3.80–5.10)
Total Lymphocyte: 13.6 %
WBC: 10.2 10*3/uL (ref 3.8–10.8)

## 2022-07-10 ENCOUNTER — Telehealth: Payer: Self-pay | Admitting: Internal Medicine

## 2022-07-10 NOTE — Patient Instructions (Signed)
She has an Enterococcus UTI and will take Macrobid twice daily for 7 days with follow-up here afterwards for urine specimen and nurse visit.  Other labs are stable.  Pneumonia 20 vaccine can be obtained at pharmacy.  Unfortunately patient started Bactrim after she saw her results in MyChart.  Bactrim is not sensitive to this organism.  Macrobid (nitrofurantoin) is sensitive to the organism.  She will need follow-up after completing 7-day course.  Other labs are stable.

## 2022-07-10 NOTE — Patient Instructions (Signed)
Patient is to stop antibiotics.  Stay with clear liquids.  Has been treated with Macrobid for Enterococcus UTI but took Bactrim on her own before starting Macrobid.  C. difficile toxin has been ordered.  O&P ordered.  Call if symptoms worsen over the weekend.

## 2022-07-10 NOTE — Telephone Encounter (Signed)
Left voice mail message regarding results that are back in [facility]. Still waiting on C diff toxin and O and P. Asked that pt call us Monday with progress report. MJB, MD

## 2022-07-11 ENCOUNTER — Telehealth: Payer: Self-pay

## 2022-07-11 LAB — POCT URINALYSIS DIPSTICK
Bilirubin, UA: NEGATIVE
Blood, UA: NEGATIVE
Glucose, UA: NEGATIVE
Ketones, UA: NEGATIVE
Leukocytes, UA: NEGATIVE
Nitrite, UA: NEGATIVE
Protein, UA: NEGATIVE
Spec Grav, UA: 1.01 (ref 1.010–1.025)
Urobilinogen, UA: 0.2 E.U./dL
pH, UA: 5 (ref 5.0–8.0)

## 2022-07-11 NOTE — Telephone Encounter (Signed)
Patient returned my call and stated she is feeling much better and the diarrhea stopped on Sunday and she was able to eat a lite breakfast,  this morning she did have some diarrhea but believes it's due to eating breakfast the day before, this morning she was able to eat jello.  She wants to transition from clear diet to eating a regular lite diet.

## 2022-07-11 NOTE — Addendum Note (Signed)
Addended by: Mary Sella D on: 07/11/2022 04:05 PM   Modules accepted: Orders

## 2022-07-11 NOTE — Telephone Encounter (Signed)
Called patient unable to reach her, a voicemail was left asking the patient to call the office at her convenience.  I also called [facility] and spoke with Marny Lowenstein and he stated O & P and C.diff specimen was received and was transport to a lab that usually take 2-3 days travel time and once received results are released within 9-10 days.

## 2022-07-11 NOTE — Telephone Encounter (Signed)
Patient's husband dropped off 2 urine specimens.  They were both given POC dipstick and results are normal patient was called and notified via voicemail.

## 2022-07-12 ENCOUNTER — Encounter: Payer: Self-pay | Admitting: Internal Medicine

## 2022-07-12 ENCOUNTER — Ambulatory Visit (INDEPENDENT_AMBULATORY_CARE_PROVIDER_SITE_OTHER): Payer: Medicare Other | Admitting: Internal Medicine

## 2022-07-12 ENCOUNTER — Telehealth: Payer: Self-pay

## 2022-07-12 VITALS — BP 106/62 | HR 77 | Temp 98.2°F | Ht 59.5 in | Wt 143.0 lb

## 2022-07-12 DIAGNOSIS — K529 Noninfective gastroenteritis and colitis, unspecified: Secondary | ICD-10-CM

## 2022-07-12 NOTE — Telephone Encounter (Signed)
Patient called to say she is still having diarrhea and cramps, she has not taking any Imodium since since Saturday, she has only taking 4 of the Imodium

## 2022-07-12 NOTE — Progress Notes (Signed)
Patient Care Team: Margaree Mackintosh, MD as PCP - General (Internal Medicine) Camille Bal, MD as Consulting Physician (Nephrology) Gaylord Shih, [facility] (Specialist) Suzi Roots as Physician Assistant (Dermatology)  Visit Date: 07/12/22  Subjective:    Patient ID: Charlotte Duarte , Female   DOB: [birth year], 82 y.o.    MRN: [id number]   82 y.o. Female presents today for diarrhea since 07/06/22. Feels better but is still fatigued. Denies fever, chills, blood in stool. Started taking probiotic.  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   Hyperlipidemia    Hypertension    Renal artery stenosis (HCC)    SUI (stress urinary incontinence, female)      Family History  Problem Relation Age of Onset   Mental illness Mother    Breast cancer Mother 41   Hypertension Mother    Heart disease Father    ALS Brother    Stroke Brother     Social History   Social History Narrative   Not on file      Review of Systems  Constitutional:  Positive for malaise/fatigue. Negative for chills and fever.  HENT:  Negative for congestion.   Eyes:  Negative for blurred vision.  Respiratory:  Negative for cough and shortness of breath.   Cardiovascular:  Negative for chest pain, palpitations and leg swelling.  Gastrointestinal:  Positive for diarrhea. Negative for vomiting.  Musculoskeletal:  Negative for back pain.  Skin:  Negative for rash.  Neurological:  Negative for loss of consciousness and headaches.        Objective:   Vitals: BP 106/62   Pulse 77   Temp 98.2 F (36.8 C) (Tympanic)   Ht 4' 11.5" (1.511 m)   Wt 143 lb (64.9 kg)   SpO2 96%   BMI 28.40 kg/m    Physical Exam Vitals and nursing note reviewed.  Constitutional:      General: She is not in acute  distress.    Appearance: Normal appearance. She is not toxic-appearing.  HENT:     Head: Normocephalic and atraumatic.  Pulmonary:     Effort: Pulmonary effort is normal.  Skin:    General: Skin is warm and dry.  Neurological:     Mental Status: She is alert and oriented to person, place, and time. Mental status is at baseline.  Psychiatric:        Mood and Affect: Mood normal.        Behavior: Behavior normal.        Thought Content: Thought content normal.        Judgment: Judgment normal.       Results:   Studies obtained and personally reviewed by me:   Labs:       Component Value Date/Time   NA 138 07/08/2022 1256   NA 140 05/27/2022 0000   K 3.7 07/08/2022 1256   CL 101 07/08/2022 1256   CO2 26 07/08/2022 1256   GLUCOSE 134 (H) 07/08/2022 1256   BUN 30 (H) 07/08/2022 1256   BUN 29 (A) 05/27/2022 0000   CREATININE 1.41 (H) 07/08/2022 1256   CALCIUM 9.9 07/08/2022 1256   PROT 7.1 07/08/2022 1256   ALBUMIN 4.3 05/27/2022 0000  AST 25 07/08/2022 1256   ALT 19 07/08/2022 1256   ALKPHOS 40 03/28/2015 0536   BILITOT 0.5 07/08/2022 1256   GFRNONAA 37 (L) 03/12/2020 1111   GFRAA 43 (L) 03/12/2020 1111     Lab Results  Component Value Date   WBC 10.2 07/08/2022   HGB 13.1 07/08/2022   HCT 39.9 07/08/2022   MCV 85.4 07/08/2022   PLT 323 07/08/2022    Lab Results  Component Value Date   CHOL 165 06/16/2022   HDL 49 (L) 06/16/2022   LDLCALC 89 06/16/2022   TRIG 175 (H) 06/16/2022   CHOLHDL 3.4 06/16/2022    Lab Results  Component Value Date   HGBA1C 6.2 (H) 06/16/2022     Lab Results  Component Value Date   TSH 2.43 06/16/2022      Assessment & Plan:   Diarrhea: pending stool culture. Urinalysis negative 07/11/22. Can take Imodium. Stay with clear liquids and avoid milk products until diarrhea improves and then may advance diet slowly.     I,Alexander Ruley,acting as a Neurosurgeon for Margaree Mackintosh, MD.,have documented all relevant documentation  on the behalf of Margaree Mackintosh, MD,as directed by  Margaree Mackintosh, MD while in the presence of Margaree Mackintosh, MD.   I, Margaree Mackintosh, MD, have reviewed all documentation for this visit. The documentation on 08/01/22 for the exam, diagnosis, procedures, and orders are all accurate and complete.

## 2022-07-12 NOTE — Telephone Encounter (Signed)
I called Erra from [facility] to get some assistance with getting stool study released sooner and was told O&P is due to be released tomorrow morning and she will e-mail the department responsible for C. Diff to get those results released sooner.  She will also follow up with a telephone call tomorrow.

## 2022-07-12 NOTE — Telephone Encounter (Signed)
scheduled

## 2022-07-12 NOTE — Telephone Encounter (Signed)
Spoke with Selena Batten from [facility] to follow up on stool studies,  I was told turn around time for  C. Diff  8-9 days O&P  5-7 days

## 2022-07-13 ENCOUNTER — Encounter: Payer: Self-pay | Admitting: Internal Medicine

## 2022-07-13 ENCOUNTER — Telehealth: Payer: Self-pay | Admitting: Internal Medicine

## 2022-07-13 LAB — OVA AND PARASITE EXAMINATION
CONCENTRATE RESULT:: NONE SEEN
MICRO NUMBER:: 14971648
SPECIMEN QUALITY:: ADEQUATE
TRICHROME RESULT:: NONE SEEN

## 2022-07-13 LAB — CLOSTRIDIUM DIFFICILE CULTURE-FECAL

## 2022-07-13 NOTE — Telephone Encounter (Signed)
[facility] has expedited order for testing as requested. Says results should be available by Friday. However, pt is traveling to [place] onFriday. Stay with clear liquids. May need Lomotil for travel. See if results return tomorrow.Left pt a voice mail. MJB, MD

## 2022-07-13 NOTE — Telephone Encounter (Signed)
Charlotte Duarte called today to say she has gone the last 24 hours without having any diarrhea, she is feeling some better, she is going to start adding some food slowly and does not want to take another antibiotic unless she just has too.

## 2022-08-01 ENCOUNTER — Telehealth: Payer: Self-pay

## 2022-08-01 NOTE — Patient Instructions (Signed)
Stool culture pending. Can take Immodium sparingly. Avoid milk products. Advance diet slowly after diarrhea subsides.

## 2022-08-01 NOTE — Telephone Encounter (Signed)
Patient states that her 10 run with diarrhea ended about 3 weeks ago but then last week went on vacation and when she went back to her normal diet had a bout with quite a bit of blood in it. That happened Sunday and she went back to clear liquid diet and has had none since. She is not sure if she should see GI or what to do from here but she states that it seems to be tied to what she eats/drinks. Please advise.

## 2022-08-02 ENCOUNTER — Other Ambulatory Visit: Payer: Self-pay | Admitting: Internal Medicine

## 2022-08-02 ENCOUNTER — Ambulatory Visit (INDEPENDENT_AMBULATORY_CARE_PROVIDER_SITE_OTHER): Payer: Medicare Other | Admitting: Internal Medicine

## 2022-08-02 ENCOUNTER — Encounter: Payer: Self-pay | Admitting: Internal Medicine

## 2022-08-02 VITALS — BP 128/82 | HR 70 | Temp 97.9°F | Resp 16 | Ht 59.5 in | Wt 145.0 lb

## 2022-08-02 DIAGNOSIS — K625 Hemorrhage of anus and rectum: Secondary | ICD-10-CM

## 2022-08-02 DIAGNOSIS — R197 Diarrhea, unspecified: Secondary | ICD-10-CM | POA: Diagnosis not present

## 2022-08-02 MED ORDER — HYOSCYAMINE SULFATE 0.125 MG SL SUBL: SUBLINGUAL_TABLET | SUBLINGUAL | 0 refills | Status: DC

## 2022-08-02 NOTE — Progress Notes (Signed)
Patient Care Team: Margaree Mackintosh, MD as PCP - General (Internal Medicine) Camille Bal, MD as Consulting Physician (Nephrology) Gaylord Shih, [facility] (Specialist) Suzi Roots as Physician Assistant (Dermatology)  Visit Date: 08/02/22  Subjective:    Patient ID: Charlotte Duarte , Female   DOB: October 17, 1940, 82 y.o.    MRN: [id number]   82 y.o. Female presents today for diarrhea with bright red blood on 07/30/22. This episode happened after having [place] food with a gin and tonic drink. Has a history of diarrhea. Denies pain. Diarrhea has resolved after cleaning up diet. Last colonoscopy 2017.  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  She was treated with Plavix for a while for renal artery stenosis but that has been discontinued.  History of hyperlipidemia, hypertension, GE reflux, allergic rhinitis.  Arthroscopy of both knees 1990 1995 by Dr. Thurston Hole.  Breast augmentation in 1980 and had implant removal in 2007.  History of lumbar spinal stenosis 2013.  History of right bundle branch block on EKG.  Carpal tunnel surgery release 1993.  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  History of impaired glucose tolerance.  Bilateral sensorineural hearing loss.  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  Right hip arthroplasty by Dr. Magnus Ivan in 2013.  Left carpal tunnel release by Dr. Mina Marble December 2021 and had left thumb triggering at that time requiring A1 pulley release.  History of GE reflux  Social history: Social alcohol consumption.  She is a lacto over the vegetarian.  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  He is married.  Is a non-smoker.  Family history: Mother died of an acute MI at age 23.  Mother had breast cancer and an aneurysm in her leg.  Brother with history of brain aneurysm.  Another brother with history of ALS.  Both parents with history of hypertension.  Review of Systems  Constitutional:  Negative for fever and malaise/fatigue.  HENT:  Negative for congestion.   Eyes:  Negative for blurred vision.  Respiratory:  Negative for cough and shortness of breath.   Cardiovascular:  Negative for chest pain, palpitations and leg swelling.  Gastrointestinal:  Positive for diarrhea. Negative for abdominal pain and vomiting.  Musculoskeletal:  Negative for back pain.  Skin:  Negative for rash.  Neurological:  Negative for loss of consciousness and headaches.        Objective:   Vitals: BP 128/82   Pulse 70   Temp 97.9 F (36.6 C) (Tympanic)   Resp 16   Ht 4' 11.5" (1.511 m)   Wt  145 lb (65.8 kg)   SpO2 98%   BMI 28.80 kg/m    Physical Exam Vitals and nursing note reviewed.  Constitutional:      General: She is not in acute distress.    Appearance: Normal appearance. She is not toxic-appearing.  HENT:     Head: Normocephalic and atraumatic.  Pulmonary:     Effort: Pulmonary effort is normal.  Abdominal:     General: Abdomen is flat. There is no distension.     Palpations: There is no hepatomegaly, splenomegaly or mass.     Tenderness: There is no abdominal tenderness.  Skin:    General: Skin is warm and dry.  Neurological:     Mental Status: She is alert and oriented to  person, place, and time. Mental status is at baseline.  Psychiatric:        Mood and Affect: Mood normal.        Behavior: Behavior normal.        Thought Content: Thought content normal.        Judgment: Judgment normal.       Results:   Studies obtained and personally reviewed by me:   Labs:       Component Value Date/Time   NA 138 07/08/2022 1256   NA 140 05/27/2022 0000   K 3.7 07/08/2022 1256   CL 101 07/08/2022 1256   CO2 26 07/08/2022 1256   GLUCOSE 134 (H) 07/08/2022 1256   BUN 30 (H) 07/08/2022 1256   BUN 29 (A) 05/27/2022 0000   CREATININE 1.41 (H) 07/08/2022 1256   CALCIUM 9.9 07/08/2022 1256   PROT 7.1 07/08/2022 1256   ALBUMIN 4.3 05/27/2022 0000   AST 25 07/08/2022 1256   ALT 19 07/08/2022 1256   ALKPHOS 40 03/28/2015 0536   BILITOT 0.5 07/08/2022 1256   GFRNONAA 37 (L) 03/12/2020 1111   GFRAA 43 (L) 03/12/2020 1111     Lab Results  Component Value Date   WBC 10.2 07/08/2022   HGB 13.1 07/08/2022   HCT 39.9 07/08/2022   MCV 85.4 07/08/2022   PLT 323 07/08/2022    Lab Results  Component Value Date   CHOL 165 06/16/2022   HDL 49 (L) 06/16/2022   LDLCALC 89 06/16/2022   TRIG 175 (H) 06/16/2022   CHOLHDL 3.4 06/16/2022    Lab Results  Component Value Date   HGBA1C 6.2 (H) 06/16/2022     Lab Results  Component Value Date   TSH 2.43 06/16/2022      Assessment & Plan:   Diarrhea: Possible irritable bowel syndrome. Need to rule out Colitis or inflammatory bowel disease. Referral to gastroenterologist for repeat colonoscopy. Prescribed Levsin 0.125 mg before meals.  30 minutes spent with patient reviewing previous visits, past medical history, examining patient and medical decision making  I,Alexander Ruley,acting as a scribe for Margaree Mackintosh, MD.,have documented all relevant documentation on the behalf of Margaree Mackintosh, MD,as directed by  Margaree Mackintosh, MD while in the presence of Margaree Mackintosh, MD.   I, Margaree Mackintosh, MD, have  reviewed all documentation for this visit. The documentation on 08/14/22 for the exam, diagnosis, procedures, and orders are all accurate and complete.

## 2022-08-08 ENCOUNTER — Encounter: Payer: Self-pay | Admitting: Gastroenterology

## 2022-08-14 NOTE — Patient Instructions (Addendum)
Referral to Gastroenterology for diarrhea not responding to conservative measures.  Have prescribed Levsin to try.  No recent colonoscopy.  Thinks she saw Dr. Boyd Kerbs in the remote past.

## 2022-08-20 ENCOUNTER — Other Ambulatory Visit: Payer: Self-pay | Admitting: Internal Medicine

## 2022-09-03 ENCOUNTER — Other Ambulatory Visit: Payer: Self-pay | Admitting: Internal Medicine

## 2022-09-07 DIAGNOSIS — M5416 Radiculopathy, lumbar region: Secondary | ICD-10-CM | POA: Diagnosis not present

## 2022-09-07 DIAGNOSIS — H353121 Nonexudative age-related macular degeneration, left eye, early dry stage: Secondary | ICD-10-CM | POA: Diagnosis not present

## 2022-09-07 DIAGNOSIS — H353210 Exudative age-related macular degeneration, right eye, stage unspecified: Secondary | ICD-10-CM | POA: Diagnosis not present

## 2022-09-18 ENCOUNTER — Other Ambulatory Visit: Payer: Self-pay | Admitting: Internal Medicine

## 2022-09-21 DIAGNOSIS — M5416 Radiculopathy, lumbar region: Secondary | ICD-10-CM | POA: Diagnosis not present

## 2022-09-29 ENCOUNTER — Ambulatory Visit: Payer: Medicare Other | Admitting: Gastroenterology

## 2022-09-29 DIAGNOSIS — K921 Melena: Secondary | ICD-10-CM | POA: Diagnosis not present

## 2022-09-29 DIAGNOSIS — R197 Diarrhea, unspecified: Secondary | ICD-10-CM | POA: Diagnosis not present

## 2022-10-14 DIAGNOSIS — M5135 Other intervertebral disc degeneration, thoracolumbar region: Secondary | ICD-10-CM | POA: Diagnosis not present

## 2022-10-14 DIAGNOSIS — M9902 Segmental and somatic dysfunction of thoracic region: Secondary | ICD-10-CM | POA: Diagnosis not present

## 2022-10-14 DIAGNOSIS — M9904 Segmental and somatic dysfunction of sacral region: Secondary | ICD-10-CM | POA: Diagnosis not present

## 2022-10-14 DIAGNOSIS — M5137 Other intervertebral disc degeneration, lumbosacral region: Secondary | ICD-10-CM | POA: Diagnosis not present

## 2022-10-14 DIAGNOSIS — M5417 Radiculopathy, lumbosacral region: Secondary | ICD-10-CM | POA: Diagnosis not present

## 2022-10-14 DIAGNOSIS — M9903 Segmental and somatic dysfunction of lumbar region: Secondary | ICD-10-CM | POA: Diagnosis not present

## 2022-10-17 DIAGNOSIS — M5417 Radiculopathy, lumbosacral region: Secondary | ICD-10-CM | POA: Diagnosis not present

## 2022-10-17 DIAGNOSIS — M9902 Segmental and somatic dysfunction of thoracic region: Secondary | ICD-10-CM | POA: Diagnosis not present

## 2022-10-17 DIAGNOSIS — M5135 Other intervertebral disc degeneration, thoracolumbar region: Secondary | ICD-10-CM | POA: Diagnosis not present

## 2022-10-17 DIAGNOSIS — M5137 Other intervertebral disc degeneration, lumbosacral region: Secondary | ICD-10-CM | POA: Diagnosis not present

## 2022-10-17 DIAGNOSIS — M9903 Segmental and somatic dysfunction of lumbar region: Secondary | ICD-10-CM | POA: Diagnosis not present

## 2022-10-17 DIAGNOSIS — M9904 Segmental and somatic dysfunction of sacral region: Secondary | ICD-10-CM | POA: Diagnosis not present

## 2022-10-19 DIAGNOSIS — M9902 Segmental and somatic dysfunction of thoracic region: Secondary | ICD-10-CM | POA: Diagnosis not present

## 2022-10-19 DIAGNOSIS — M5137 Other intervertebral disc degeneration, lumbosacral region: Secondary | ICD-10-CM | POA: Diagnosis not present

## 2022-10-19 DIAGNOSIS — M9904 Segmental and somatic dysfunction of sacral region: Secondary | ICD-10-CM | POA: Diagnosis not present

## 2022-10-19 DIAGNOSIS — M9903 Segmental and somatic dysfunction of lumbar region: Secondary | ICD-10-CM | POA: Diagnosis not present

## 2022-10-19 DIAGNOSIS — M5417 Radiculopathy, lumbosacral region: Secondary | ICD-10-CM | POA: Diagnosis not present

## 2022-10-19 DIAGNOSIS — M5135 Other intervertebral disc degeneration, thoracolumbar region: Secondary | ICD-10-CM | POA: Diagnosis not present

## 2022-10-21 DIAGNOSIS — K5730 Diverticulosis of large intestine without perforation or abscess without bleeding: Secondary | ICD-10-CM | POA: Diagnosis not present

## 2022-10-21 DIAGNOSIS — K921 Melena: Secondary | ICD-10-CM | POA: Diagnosis not present

## 2022-10-21 DIAGNOSIS — R197 Diarrhea, unspecified: Secondary | ICD-10-CM | POA: Diagnosis not present

## 2022-10-21 DIAGNOSIS — K648 Other hemorrhoids: Secondary | ICD-10-CM | POA: Diagnosis not present

## 2022-10-21 LAB — HM COLONOSCOPY

## 2022-10-25 DIAGNOSIS — M9902 Segmental and somatic dysfunction of thoracic region: Secondary | ICD-10-CM | POA: Diagnosis not present

## 2022-10-25 DIAGNOSIS — M9903 Segmental and somatic dysfunction of lumbar region: Secondary | ICD-10-CM | POA: Diagnosis not present

## 2022-10-25 DIAGNOSIS — M9904 Segmental and somatic dysfunction of sacral region: Secondary | ICD-10-CM | POA: Diagnosis not present

## 2022-10-25 DIAGNOSIS — M5137 Other intervertebral disc degeneration, lumbosacral region: Secondary | ICD-10-CM | POA: Diagnosis not present

## 2022-10-25 DIAGNOSIS — M5417 Radiculopathy, lumbosacral region: Secondary | ICD-10-CM | POA: Diagnosis not present

## 2022-10-25 DIAGNOSIS — M5135 Other intervertebral disc degeneration, thoracolumbar region: Secondary | ICD-10-CM | POA: Diagnosis not present

## 2022-10-27 DIAGNOSIS — R197 Diarrhea, unspecified: Secondary | ICD-10-CM | POA: Diagnosis not present

## 2022-10-28 DIAGNOSIS — M9903 Segmental and somatic dysfunction of lumbar region: Secondary | ICD-10-CM | POA: Diagnosis not present

## 2022-10-28 DIAGNOSIS — M9902 Segmental and somatic dysfunction of thoracic region: Secondary | ICD-10-CM | POA: Diagnosis not present

## 2022-10-28 DIAGNOSIS — M5417 Radiculopathy, lumbosacral region: Secondary | ICD-10-CM | POA: Diagnosis not present

## 2022-10-28 DIAGNOSIS — M9904 Segmental and somatic dysfunction of sacral region: Secondary | ICD-10-CM | POA: Diagnosis not present

## 2022-10-28 DIAGNOSIS — M5135 Other intervertebral disc degeneration, thoracolumbar region: Secondary | ICD-10-CM | POA: Diagnosis not present

## 2022-10-28 DIAGNOSIS — M5137 Other intervertebral disc degeneration, lumbosacral region: Secondary | ICD-10-CM | POA: Diagnosis not present

## 2022-10-31 DIAGNOSIS — M9904 Segmental and somatic dysfunction of sacral region: Secondary | ICD-10-CM | POA: Diagnosis not present

## 2022-10-31 DIAGNOSIS — M9902 Segmental and somatic dysfunction of thoracic region: Secondary | ICD-10-CM | POA: Diagnosis not present

## 2022-10-31 DIAGNOSIS — M5137 Other intervertebral disc degeneration, lumbosacral region: Secondary | ICD-10-CM | POA: Diagnosis not present

## 2022-10-31 DIAGNOSIS — M5417 Radiculopathy, lumbosacral region: Secondary | ICD-10-CM | POA: Diagnosis not present

## 2022-10-31 DIAGNOSIS — M9903 Segmental and somatic dysfunction of lumbar region: Secondary | ICD-10-CM | POA: Diagnosis not present

## 2022-10-31 DIAGNOSIS — M5135 Other intervertebral disc degeneration, thoracolumbar region: Secondary | ICD-10-CM | POA: Diagnosis not present

## 2022-11-02 DIAGNOSIS — M5135 Other intervertebral disc degeneration, thoracolumbar region: Secondary | ICD-10-CM | POA: Diagnosis not present

## 2022-11-02 DIAGNOSIS — M5137 Other intervertebral disc degeneration, lumbosacral region: Secondary | ICD-10-CM | POA: Diagnosis not present

## 2022-11-02 DIAGNOSIS — M9904 Segmental and somatic dysfunction of sacral region: Secondary | ICD-10-CM | POA: Diagnosis not present

## 2022-11-02 DIAGNOSIS — M9902 Segmental and somatic dysfunction of thoracic region: Secondary | ICD-10-CM | POA: Diagnosis not present

## 2022-11-02 DIAGNOSIS — M9903 Segmental and somatic dysfunction of lumbar region: Secondary | ICD-10-CM | POA: Diagnosis not present

## 2022-11-02 DIAGNOSIS — M5417 Radiculopathy, lumbosacral region: Secondary | ICD-10-CM | POA: Diagnosis not present

## 2022-12-15 DIAGNOSIS — L820 Inflamed seborrheic keratosis: Secondary | ICD-10-CM | POA: Diagnosis not present

## 2022-12-15 DIAGNOSIS — D2272 Melanocytic nevi of left lower limb, including hip: Secondary | ICD-10-CM | POA: Diagnosis not present

## 2022-12-15 DIAGNOSIS — L723 Sebaceous cyst: Secondary | ICD-10-CM | POA: Diagnosis not present

## 2022-12-15 DIAGNOSIS — Z85828 Personal history of other malignant neoplasm of skin: Secondary | ICD-10-CM | POA: Diagnosis not present

## 2022-12-15 DIAGNOSIS — L570 Actinic keratosis: Secondary | ICD-10-CM | POA: Diagnosis not present

## 2022-12-15 DIAGNOSIS — D1801 Hemangioma of skin and subcutaneous tissue: Secondary | ICD-10-CM | POA: Diagnosis not present

## 2022-12-15 DIAGNOSIS — L821 Other seborrheic keratosis: Secondary | ICD-10-CM | POA: Diagnosis not present

## 2022-12-20 DIAGNOSIS — Z905 Acquired absence of kidney: Secondary | ICD-10-CM | POA: Diagnosis not present

## 2022-12-20 DIAGNOSIS — I701 Atherosclerosis of renal artery: Secondary | ICD-10-CM | POA: Diagnosis not present

## 2022-12-20 DIAGNOSIS — N1832 Chronic kidney disease, stage 3b: Secondary | ICD-10-CM | POA: Diagnosis not present

## 2022-12-20 DIAGNOSIS — M109 Gout, unspecified: Secondary | ICD-10-CM | POA: Diagnosis not present

## 2022-12-20 DIAGNOSIS — I129 Hypertensive chronic kidney disease with stage 1 through stage 4 chronic kidney disease, or unspecified chronic kidney disease: Secondary | ICD-10-CM | POA: Diagnosis not present

## 2022-12-23 DIAGNOSIS — N1832 Chronic kidney disease, stage 3b: Secondary | ICD-10-CM | POA: Diagnosis not present

## 2022-12-25 LAB — LAB REPORT - SCANNED: EGFR: 48

## 2023-01-23 DIAGNOSIS — M9902 Segmental and somatic dysfunction of thoracic region: Secondary | ICD-10-CM | POA: Diagnosis not present

## 2023-01-23 DIAGNOSIS — M9905 Segmental and somatic dysfunction of pelvic region: Secondary | ICD-10-CM | POA: Diagnosis not present

## 2023-01-23 DIAGNOSIS — M9903 Segmental and somatic dysfunction of lumbar region: Secondary | ICD-10-CM | POA: Diagnosis not present

## 2023-01-23 DIAGNOSIS — M9906 Segmental and somatic dysfunction of lower extremity: Secondary | ICD-10-CM | POA: Diagnosis not present

## 2023-01-25 DIAGNOSIS — M25511 Pain in right shoulder: Secondary | ICD-10-CM | POA: Diagnosis not present

## 2023-01-26 DIAGNOSIS — M9902 Segmental and somatic dysfunction of thoracic region: Secondary | ICD-10-CM | POA: Diagnosis not present

## 2023-01-26 DIAGNOSIS — M9906 Segmental and somatic dysfunction of lower extremity: Secondary | ICD-10-CM | POA: Diagnosis not present

## 2023-01-26 DIAGNOSIS — M9903 Segmental and somatic dysfunction of lumbar region: Secondary | ICD-10-CM | POA: Diagnosis not present

## 2023-01-26 DIAGNOSIS — M9905 Segmental and somatic dysfunction of pelvic region: Secondary | ICD-10-CM | POA: Diagnosis not present

## 2023-01-30 DIAGNOSIS — M9902 Segmental and somatic dysfunction of thoracic region: Secondary | ICD-10-CM | POA: Diagnosis not present

## 2023-01-30 DIAGNOSIS — M9906 Segmental and somatic dysfunction of lower extremity: Secondary | ICD-10-CM | POA: Diagnosis not present

## 2023-01-30 DIAGNOSIS — M9905 Segmental and somatic dysfunction of pelvic region: Secondary | ICD-10-CM | POA: Diagnosis not present

## 2023-01-30 DIAGNOSIS — M9903 Segmental and somatic dysfunction of lumbar region: Secondary | ICD-10-CM | POA: Diagnosis not present

## 2023-02-01 DIAGNOSIS — M9905 Segmental and somatic dysfunction of pelvic region: Secondary | ICD-10-CM | POA: Diagnosis not present

## 2023-02-01 DIAGNOSIS — M9906 Segmental and somatic dysfunction of lower extremity: Secondary | ICD-10-CM | POA: Diagnosis not present

## 2023-02-01 DIAGNOSIS — M9903 Segmental and somatic dysfunction of lumbar region: Secondary | ICD-10-CM | POA: Diagnosis not present

## 2023-02-01 DIAGNOSIS — M9902 Segmental and somatic dysfunction of thoracic region: Secondary | ICD-10-CM | POA: Diagnosis not present

## 2023-02-07 DIAGNOSIS — M25551 Pain in right hip: Secondary | ICD-10-CM | POA: Diagnosis not present

## 2023-02-09 DIAGNOSIS — M9903 Segmental and somatic dysfunction of lumbar region: Secondary | ICD-10-CM | POA: Diagnosis not present

## 2023-02-09 DIAGNOSIS — M9905 Segmental and somatic dysfunction of pelvic region: Secondary | ICD-10-CM | POA: Diagnosis not present

## 2023-02-09 DIAGNOSIS — M9906 Segmental and somatic dysfunction of lower extremity: Secondary | ICD-10-CM | POA: Diagnosis not present

## 2023-02-09 DIAGNOSIS — M9902 Segmental and somatic dysfunction of thoracic region: Secondary | ICD-10-CM | POA: Diagnosis not present

## 2023-02-16 DIAGNOSIS — M9906 Segmental and somatic dysfunction of lower extremity: Secondary | ICD-10-CM | POA: Diagnosis not present

## 2023-02-16 DIAGNOSIS — M9903 Segmental and somatic dysfunction of lumbar region: Secondary | ICD-10-CM | POA: Diagnosis not present

## 2023-02-16 DIAGNOSIS — M9905 Segmental and somatic dysfunction of pelvic region: Secondary | ICD-10-CM | POA: Diagnosis not present

## 2023-02-16 DIAGNOSIS — M9902 Segmental and somatic dysfunction of thoracic region: Secondary | ICD-10-CM | POA: Diagnosis not present

## 2023-03-06 DIAGNOSIS — M25551 Pain in right hip: Secondary | ICD-10-CM | POA: Diagnosis not present

## 2023-03-08 ENCOUNTER — Other Ambulatory Visit: Payer: Self-pay | Admitting: Family

## 2023-04-05 DIAGNOSIS — Z1231 Encounter for screening mammogram for malignant neoplasm of breast: Secondary | ICD-10-CM | POA: Diagnosis not present

## 2023-04-05 LAB — HM MAMMOGRAPHY

## 2023-04-05 LAB — HM DEXA SCAN: HM Dexa Scan: NORMAL

## 2023-04-06 ENCOUNTER — Encounter: Payer: Self-pay | Admitting: Internal Medicine

## 2023-04-17 ENCOUNTER — Telehealth (INDEPENDENT_AMBULATORY_CARE_PROVIDER_SITE_OTHER): Payer: Medicare Other | Admitting: Internal Medicine

## 2023-04-17 ENCOUNTER — Telehealth: Payer: Medicare Other | Admitting: Physician Assistant

## 2023-04-17 ENCOUNTER — Encounter: Payer: Self-pay | Admitting: Internal Medicine

## 2023-04-17 VITALS — BP 114/81 | HR 71 | Temp 98.2°F | Ht 59.5 in | Wt 145.0 lb

## 2023-04-17 DIAGNOSIS — U071 COVID-19: Secondary | ICD-10-CM

## 2023-04-17 DIAGNOSIS — R3989 Other symptoms and signs involving the genitourinary system: Secondary | ICD-10-CM

## 2023-04-17 DIAGNOSIS — N1831 Chronic kidney disease, stage 3a: Secondary | ICD-10-CM | POA: Diagnosis not present

## 2023-04-17 DIAGNOSIS — J22 Unspecified acute lower respiratory infection: Secondary | ICD-10-CM

## 2023-04-17 DIAGNOSIS — J208 Acute bronchitis due to other specified organisms: Secondary | ICD-10-CM

## 2023-04-17 DIAGNOSIS — I129 Hypertensive chronic kidney disease with stage 1 through stage 4 chronic kidney disease, or unspecified chronic kidney disease: Secondary | ICD-10-CM | POA: Diagnosis not present

## 2023-04-17 DIAGNOSIS — I10 Essential (primary) hypertension: Secondary | ICD-10-CM

## 2023-04-17 MED ORDER — BENZONATATE 100 MG PO CAPS: 100.0000 mg | ORAL_CAPSULE | Freq: Three times a day (TID) | ORAL | 0 refills | Status: DC | PRN

## 2023-04-17 MED ORDER — HYDROCODONE BIT-HOMATROP MBR 5-1.5 MG/5ML PO SOLN: 5.0000 mL | Freq: Four times a day (QID) | ORAL | 0 refills | Status: DC | PRN

## 2023-04-17 MED ORDER — PREDNISONE 10 MG (21) PO TBPK
ORAL_TABLET | ORAL | 0 refills | Status: DC
Start: 2023-04-17 — End: 2023-07-05

## 2023-04-17 MED ORDER — AZITHROMYCIN 250 MG PO TABS: ORAL_TABLET | ORAL | 0 refills | Status: AC

## 2023-04-17 NOTE — Progress Notes (Signed)
 Patient Care Team: Margaree Mackintosh, MD as PCP - General (Internal Medicine) Camille Bal, MD as Consulting Physician (Nephrology) [facility] (Specialist) Glyn Ade, PA-C as Physician Assistant (Dermatology)  I connected with Elyse Hsu on 04/17/23 at 4:05 PM by video enabled telemedicine visit and verified that I am speaking with the correct person using two identifiers, myself and [name], CMA. I am in my office and patient is in their home.    I discussed the limitations, risks, security and privacy concerns of performing an evaluation and management service by telemedicine and the availability of in-person appointments. I also discussed with the patient that there may be a patient responsible charge related to this service. The patient expressed understanding and agreed to proceed.   Other persons participating in the visit and their role in the encounter: Medical scribe, Larey Brick  Patient's location: Home  Provider's location: Clinic   I provided 20 minutes of time spent during this encounter, including chart review, interviewing patient, medical decision making, and e-scribing medication and > 50% was spent counseling as documented under my assessment & plan.   Chief Complaint  Patient presents with   Covid Positive   Sore Throat   Subjective:  Patient ID:Charlotte Duarte, Charlotte Duarte DOB:10/28/1940,83 y.o. ZOX:[id number]   83 y.o. Female presents today for acute sick visit with Covid-19 +, Cough, & Sore Throat. Was seen today via E-Visit by Lucile Shutters, PA-C, who prescribed Mucinex DM, Tessalon Perles, and Prednisone 10 mg tapering course for bronchitis. Her husband subsequently tested her for Flu & Covid-19 using OTC combined test at our recommendation, which resulted POS for Covid-19. Reports that she is feeling more improved today, with Sunday being her worst day. Endorses productive cough, ear ache, sore throat, and chills. Denies fever or shaking. States  that she has been using Codeine cough syrup.   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 Negative for fever.  HENT:  Positive for ear pain and sore throat.   Respiratory:  Positive for cough and sputum production.      Objective:  Vitals: BP 114/81   Pulse 71   Temp 98.2 F (36.8 C)   Ht 4' 11.5" (1.511 m)   Wt 145 lb (65.8 kg)   SpO2 98%   BMI 28.80 kg/m  Physical Exam Vitals and nursing note reviewed.  Constitutional:      General: She is not in acute distress.    Appearance: Normal appearance. She is not toxic-appearing.  HENT:     Head: Normocephalic and atraumatic.  Pulmonary:     Effort: Pulmonary effort is normal.  Skin:    General: Skin is warm and dry.  Neurological:     Mental Status: She is alert and oriented to person, place, and time. Mental status is at baseline.  Psychiatric:        Mood and Affect: Mood normal.        Behavior: Behavior normal.        Thought Content: Thought content normal.        Judgment: Judgment normal.     Results:  Studies Obtained And Personally Reviewed By Me: Labs:     Component Value Date/Time   NA 138 07/08/2022 1256   NA 140 05/27/2022 0000   K 3.7 07/08/2022 1256   CL 101 07/08/2022 1256   CO2 26 07/08/2022 1256   GLUCOSE 134 (H) 07/08/2022 1256   BUN 30 (H) 07/08/2022 1256    BUN 29 (A) 05/27/2022 0000   CREATININE 1.41 (H) 07/08/2022 1256   CALCIUM 9.9 07/08/2022 1256   PROT 7.1 07/08/2022 1256   ALBUMIN 4.3 05/27/2022 0000   AST 25 07/08/2022 1256   ALT 19 07/08/2022 1256   ALKPHOS 40 03/28/2015 0536   BILITOT 0.5 07/08/2022 1256   GFRNONAA 37 (L) 03/12/2020 1111   GFRAA 43 (L) 03/12/2020 1111    Lab Results  Component Value Date   WBC 10.2 07/08/2022   HGB 13.1 07/08/2022   HCT 39.9 07/08/2022   MCV 85.4 07/08/2022   PLT 323 07/08/2022   Lab Results  Component Value Date   CHOL 165 06/16/2022   HDL 49 (L) 06/16/2022   LDLCALC 89 06/16/2022   TRIG 175 (H) 06/16/2022   CHOLHDL 3.4 06/16/2022   Lab Results  Component Value Date   HGBA1C 6.2 (H) 06/16/2022    Lab Results  Component Value Date   TSH 2.43 06/16/2022    Assessment & Plan:  Covid-19: sending in 250 mg Azithromycin - take 2 tablets on Day 1 and 1 tablet on Days 2-5 and Hycodan syrup for cough - take 1 teaspoon every 8 hours as needed. Instructed to quarantine for 5 days. Stay well rested, well hydrated, and well nourished. Walk around some to prevent atelectasis and monitor your Oxygen with a pulse oximeter. Contact us if symptoms worsen/persist despite treatment.   CKD Stage 3 with Creat 1.16; eGFR 48cc/min in 12/2022.  Essential Hypertension, blood pressure 114/81 today - normotensive.  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   I,Emily Lagle,acting as a Neurosurgeon for Margaree Mackintosh, MD.,have documented all relevant documentation on the behalf of Margaree Mackintosh, MD,as directed by  Margaree Mackintosh, MD while in the presence of Margaree Mackintosh, MD.   I, Margaree Mackintosh, MD, have reviewed all documentation for this visit. The documentation on 04/17/23 for the exam, diagnosis, procedures, and orders are all accurate and complete.

## 2023-04-17 NOTE — E-Visit Routing Comment (Signed)
 Had patients husband go pick up COVID / Flu test and patient has COVID

## 2023-04-17 NOTE — Progress Notes (Signed)
 E-Visit for Cough   We are sorry that you are not feeling well.  Here is how we plan to help!  Based on your presentation I believe you most likely have A cough due to a virus.  This is called viral bronchitis and is best treated by rest, plenty of fluids and control of the cough.  You may use Ibuprofen or Tylenol as directed to help your symptoms.     In addition you may use A non-prescription cough medication called Mucinex DM: take 2 tablets every 12 hours. and A prescription cough medication called Tessalon Perles 100mg . You may take 1-2 capsules every 8 hours as needed for your cough.  Prednisone 10 mg daily for 6 days (see taper instructions below)  Directions for 6 day taper: Day 1: 2 tablets before breakfast, 1 after both lunch & dinner and 2 at bedtime Day 2: 1 tab before breakfast, 1 after both lunch & dinner and 2 at bedtime Day 3: 1 tab at each meal & 1 at bedtime Day 4: 1 tab at breakfast, 1 at lunch, 1 at bedtime Day 5: 1 tab at breakfast & 1 tab at bedtime Day 6: 1 tab at breakfast  From your responses in the eVisit questionnaire you describe inflammation in the upper respiratory tract which is causing a significant cough.  This is commonly called Bronchitis and has four common causes:   Allergies Viral Infections Acid Reflux Bacterial Infection Allergies, viruses and acid reflux are treated by controlling symptoms or eliminating the cause. An example might be a cough caused by taking certain blood pressure medications. You stop the cough by changing the medication. Another example might be a cough caused by acid reflux. Controlling the reflux helps control the cough.     HOME CARE Only take medications as instructed by your medical team. Complete the entire course of an antibiotic. Drink plenty of fluids and get plenty of rest. Avoid close contacts especially the very young and the elderly Cover your mouth if you cough or cough into your sleeve. Always remember to  wash your hands A steam or ultrasonic humidifier can help congestion.   GET HELP RIGHT AWAY IF: You develop worsening fever. You become short of breath You cough up blood. Your symptoms persist after you have completed your treatment plan MAKE SURE YOU  Understand these instructions. Will watch your condition. Will get help right away if you are not doing well or get worse.    Thank you for choosing an e-visit.  Your e-visit answers were reviewed by a board certified advanced clinical practitioner to complete your personal care plan. Depending upon the condition, your plan could have included both over the counter or prescription medications.  Please review your pharmacy choice. Make sure the pharmacy is open so you can pick up prescription now. If there is a problem, you may contact your provider through [employer] and have the prescription routed to another pharmacy.  Your safety is important to [place]. If you have drug allergies check your prescription carefully.   For the next 24 hours you can use MyChart to ask questions about today's visit, request a non-urgent call back, or ask for a work or school excuse. You will get an email in the next two days asking about your experience. I hope that your e-visit has been valuable and will speed your recovery.   I have spent 5 minutes in review of e-visit questionnaire, review and updating patient chart, medical decision making and  response to patient.   Margaretann Loveless, PA-C

## 2023-04-17 NOTE — Patient Instructions (Addendum)
 Patient seen by video visit. Offered Paxlovid vs antibiotic. Patient has chosen antibiotic therapy. Hx Stage 3 CKD. Sent in Zithromax Z pak and  Hycodan cough syrup. Patient is to stay well hydrated and monitor pulse oximetry. Quarantine for 5 days at home.

## 2023-04-18 MED ORDER — HYDROCOD POLI-CHLORPHE POLI ER 10-8 MG/5ML PO SUER: 5.0000 mL | Freq: Two times a day (BID) | ORAL | Status: DC | PRN

## 2023-04-18 NOTE — Telephone Encounter (Signed)
 Patient unable to get Hycodan. Sending in Tussionex one tsp every 12 hours as needed. MJB, MD

## 2023-04-19 ENCOUNTER — Other Ambulatory Visit: Payer: Self-pay

## 2023-04-19 MED ORDER — HYDROCOD POLI-CHLORPHE POLI ER 10-8 MG/5ML PO SUER: 5.0000 mL | Freq: Two times a day (BID) | ORAL | 0 refills | Status: DC | PRN

## 2023-04-19 NOTE — Telephone Encounter (Signed)
 Copied from CRM [id number]. Topic: Clinical - Prescription Issue >> Apr 19, 2023 10:17 AM Gaetano Hawthorne wrote: Reason for CRM: Patient is calling to report that pharmacy is not received the prescription order for Tussionex cough syrup. Patient's pharmacy is below:  [id number] - [place], [place] - [address]  Phone: [phone] Fax: [phone]  Please call patient to confirm - she also reports that her Blood pressure and Oxy look good and wanted to pass that along to the doctor.

## 2023-04-19 NOTE — Telephone Encounter (Signed)
 Copied from CRM [id number]. Topic: Clinical - Medication Refill >> Apr 19, 2023  2:23 PM Charlotte Duarte wrote: Most Recent Primary Care Visit:  Provider: Margaree Mackintosh  Department: [facility]  Visit Type: ACUTE  Date: 04/17/2023  Medication: tussionex  Has the patient contacted their pharmacy? Yes (Agent: If no, request that the patient contact the pharmacy for the refill. If patient does not wish to contact the pharmacy document the reason why and proceed with request.) (Agent: If yes, when and what did the pharmacy advise?)Pharmacy sent fax to office for approval.   Is this the correct pharmacy for this prescription? Yes If no, delete pharmacy and type the correct one.  This is the patient's preferred pharmacy:  [id number] - [place], [place] - [address] [place] [address] Phone: [phone] Fax: [phone]    Has the prescription been filled recently? Yes  Is the patient out of the medication? Yes  Has the patient been seen for an appointment in the last year OR does the patient have an upcoming appointment? Yes  Can we respond through MyChart? Yes  Agent: Please be advised that Rx refills may take up to 3 business days. We ask that you follow-up with your pharmacy.

## 2023-04-26 ENCOUNTER — Other Ambulatory Visit: Payer: Self-pay | Admitting: Internal Medicine

## 2023-05-17 ENCOUNTER — Telehealth: Payer: Self-pay | Admitting: *Deleted

## 2023-05-17 NOTE — Telephone Encounter (Signed)
 Copied from CRM [id number]. Topic: Clinical - Prescription Issue >> May 17, 2023 10:37 AM DeAngela L wrote: Reason for CRM: Patient called and pharmacy contacted her about needing an approval for  rosuvastatin (CRESTOR) 5 MG tablet  Pharmacy says they have not heard back from the Dr office for the Order #[id number] [facility] - [place], [place] - [address] [facility] [address] [place] [address] [place] [address] Phone: [phone] Fax: [phone]

## 2023-05-17 NOTE — Telephone Encounter (Signed)
 Rx sent in

## 2023-05-18 NOTE — Telephone Encounter (Signed)
 Pt.notified

## 2023-05-31 ENCOUNTER — Other Ambulatory Visit (INDEPENDENT_AMBULATORY_CARE_PROVIDER_SITE_OTHER): Payer: Self-pay

## 2023-05-31 ENCOUNTER — Encounter: Payer: Self-pay | Admitting: Orthopaedic Surgery

## 2023-05-31 ENCOUNTER — Ambulatory Visit: Admitting: Orthopaedic Surgery

## 2023-05-31 DIAGNOSIS — M79604 Pain in right leg: Secondary | ICD-10-CM

## 2023-05-31 DIAGNOSIS — M5441 Lumbago with sciatica, right side: Secondary | ICD-10-CM

## 2023-05-31 DIAGNOSIS — G8929 Other chronic pain: Secondary | ICD-10-CM

## 2023-05-31 NOTE — Progress Notes (Signed)
 The patient is a very pleasant and active 83 year old female who comes in for evaluation treatment of right hip pain.  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  She points to her sciatic region as a source of her pain and says it radiates all the way down the back of her leg into her foot and her Achilles.  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  She has not had a MRI of her back.  She denies any significant back pain but again she shows the sciatic region as a source of her pain and she said when she does place kinetic tape over this area does help.  On my exam she does have a positive straight leg raise on the right side.  Her right hip moves smoothly and fluidly.  When I have her lay on her side there is a little bit of pain over the tip of her trochanteric area but most of her pain seems to be sciatic and radiates down to the ankle and foot.  She does not walk with a significant Trendelenburg gait.  An AP and lateral lumbar spine shows degenerative changes at several levels with no acute findings and no significant malalignment.  An AP pelvis and lateral of the right hip shows spurring around both hip trochanteric areas on both sides.  The hip replacement on the right side is well-seated.  At this point we do need to obtain the MRI first of her lumbar spine to rule out nerve compression given the significant sciatica that radiates all the way down to her foot and ankle on the right side.  We talked about the potential for different type of therapy for her back and her hip or even a steroid injection of her right hip trochanteric area.  She had developed that the gluteus medius tear as a source of all of her pain but told her that that is likely would be only the source of pain at just the trochanteric area but because of the pain discomfort that radiates into the foot and ankle.  She agrees with trying an MRI to see  if we can determine the source of her sciatica.  Will see her back once we have this MRI.

## 2023-06-01 ENCOUNTER — Other Ambulatory Visit: Payer: Self-pay

## 2023-06-01 DIAGNOSIS — G8929 Other chronic pain: Secondary | ICD-10-CM

## 2023-06-01 DIAGNOSIS — M79604 Pain in right leg: Secondary | ICD-10-CM

## 2023-06-12 ENCOUNTER — Ambulatory Visit
Admission: RE | Admit: 2023-06-12 | Discharge: 2023-06-12 | Disposition: A | Discharge status: Home / Self Care | Source: Ambulatory Visit | Attending: Orthopaedic Surgery | Admitting: Orthopaedic Surgery

## 2023-06-12 DIAGNOSIS — M79604 Pain in right leg: Secondary | ICD-10-CM

## 2023-06-12 DIAGNOSIS — G8929 Other chronic pain: Secondary | ICD-10-CM

## 2023-06-12 DIAGNOSIS — M47816 Spondylosis without myelopathy or radiculopathy, lumbar region: Secondary | ICD-10-CM | POA: Diagnosis not present

## 2023-06-12 DIAGNOSIS — M4316 Spondylolisthesis, lumbar region: Secondary | ICD-10-CM | POA: Diagnosis not present

## 2023-06-12 DIAGNOSIS — M5126 Other intervertebral disc displacement, lumbar region: Secondary | ICD-10-CM | POA: Diagnosis not present

## 2023-06-19 ENCOUNTER — Other Ambulatory Visit: Payer: Medicare Other

## 2023-06-19 DIAGNOSIS — Z0000 Encounter for general adult medical examination without abnormal findings: Secondary | ICD-10-CM | POA: Diagnosis not present

## 2023-06-19 DIAGNOSIS — I4510 Unspecified right bundle-branch block: Secondary | ICD-10-CM | POA: Diagnosis not present

## 2023-06-19 DIAGNOSIS — E7800 Pure hypercholesterolemia, unspecified: Secondary | ICD-10-CM | POA: Diagnosis not present

## 2023-06-19 DIAGNOSIS — I10 Essential (primary) hypertension: Secondary | ICD-10-CM | POA: Diagnosis not present

## 2023-06-20 LAB — COMPLETE METABOLIC PANEL WITHOUT GFR
AG Ratio: 1.8 (calc) (ref 1.0–2.5)
ALT: 17 U/L (ref 6–29)
AST: 23 U/L (ref 10–35)
Albumin: 4.6 g/dL (ref 3.6–5.1)
Alkaline phosphatase (APISO): 75 U/L (ref 37–153)
BUN/Creatinine Ratio: 24 (calc) — ABNORMAL HIGH (ref 6–22)
BUN: 31 mg/dL — ABNORMAL HIGH (ref 7–25)
CO2: 28 mmol/L (ref 20–32)
Calcium: 10.1 mg/dL (ref 8.6–10.4)
Chloride: 102 mmol/L (ref 98–110)
Creat: 1.28 mg/dL — ABNORMAL HIGH (ref 0.60–0.95)
Globulin: 2.5 g/dL (ref 1.9–3.7)
Glucose, Bld: 92 mg/dL (ref 65–99)
Potassium: 3.9 mmol/L (ref 3.5–5.3)
Sodium: 140 mmol/L (ref 135–146)
Total Bilirubin: 0.4 mg/dL (ref 0.2–1.2)
Total Protein: 7.1 g/dL (ref 6.1–8.1)

## 2023-06-20 LAB — CBC WITH DIFFERENTIAL/PLATELET
Absolute Lymphocytes: 1094 {cells}/uL (ref 850–3900)
Absolute Monocytes: 410 {cells}/uL (ref 200–950)
Basophils Absolute: 51 {cells}/uL (ref 0–200)
Basophils Relative: 0.9 %
Eosinophils Absolute: 268 {cells}/uL (ref 15–500)
Eosinophils Relative: 4.7 %
HCT: 38.7 % (ref 35.0–45.0)
Hemoglobin: 12.5 g/dL (ref 11.7–15.5)
MCH: 28.2 pg (ref 27.0–33.0)
MCHC: 32.3 g/dL (ref 32.0–36.0)
MCV: 87.2 fL (ref 80.0–100.0)
MPV: 10.1 fL (ref 7.5–12.5)
Monocytes Relative: 7.2 %
Neutro Abs: 3876 {cells}/uL (ref 1500–7800)
Neutrophils Relative %: 68 %
Platelets: 311 10*3/uL (ref 140–400)
RBC: 4.44 10*6/uL (ref 3.80–5.10)
RDW: 13.7 % (ref 11.0–15.0)
Total Lymphocyte: 19.2 %
WBC: 5.7 10*3/uL (ref 3.8–10.8)

## 2023-06-20 LAB — TSH: TSH: 1.93 m[IU]/L (ref 0.40–4.50)

## 2023-06-20 LAB — LIPID PANEL
Cholesterol: 169 mg/dL (ref <200)
HDL: 57 mg/dL (ref >=50)
LDL Cholesterol (Calc): 91 mg/dL
Non-HDL Cholesterol (Calc): 112 mg/dL (ref <130)
Total CHOL/HDL Ratio: 3 (calc) (ref <5.0)
Triglycerides: 113 mg/dL (ref <150)

## 2023-06-21 ENCOUNTER — Ambulatory Visit: Admitting: Orthopaedic Surgery

## 2023-06-21 DIAGNOSIS — M5441 Lumbago with sciatica, right side: Secondary | ICD-10-CM

## 2023-06-21 DIAGNOSIS — G8929 Other chronic pain: Secondary | ICD-10-CM | POA: Diagnosis not present

## 2023-06-21 NOTE — Progress Notes (Signed)
 The patient is an 83 year old female that I am seeing in follow-up after having a MRI of her lumbar spine.  She was having radicular symptoms from the sciatic region going into her Achilles area on the right side.  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  She is still having the same symptoms.  They are not debilitating but they are concerning for her.  Years ago she was scheduled to have some spine surgery for a synovial cyst that then resolved.  On exam her right hip and right knee moves smoothly and fluidly as does her right foot.  She has just a mildly positive straight leg raise and some slightly decreased sensation in that leg but is not debilitating for her.  MRI of her lumbar spine does show a small anterolisthesis which is grade 1 between L4 and L5 and this could be causing some stenosis of the lateral recess at that level or even the level below that.  This could correlate with her symptoms.  This point I would like to send her to Dr. Daisey Dryer for a right sided L4-L5 versus L5-S1 ESI.  She agrees with this treatment plan as well.  We will work on getting that set up.

## 2023-06-21 NOTE — Progress Notes (Signed)
 Annual Wellness Visit   Patient Care Team: Adiba Fargnoli, Jaynie Meyers, MD as PCP - General (Internal Medicine) Verlena Glenn, MD as Consulting Physician (Nephrology) Amos Balint, Emerge (Specialist) Sheffield, Kelli R, PA-C as Physician Assistant (Dermatology)  Visit Date: 06/26/23   Chief Complaint  Patient presents with   Medicare Wellness   Annual Exam   Subjective:  Patient: Charlotte Duarte, Female DOB: [birth year], 83 y.o. MRN: [id number] Charlotte Duarte is a 83 y.o. Female who presents today for her Annual Wellness Visit. 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  Says she is having Right Back Pain evaluated and has been going through PT, saw a chiropractor, tried dry needling, and tried massages. Denies incident associated with back strain. Lumbar MRI on 06/21/2023 w/ Mild non-compressive disc bulges from T10-11 through L2-3. Mild facet hypertrophy at L2-3 but no significant encroachment. L4-5: Chronic facet arthropathy with 4 mm of anterolisthesis. Bulging of the disc. Stenosis of both lateral recesses that could possibly be symptomatic. Large synovial cyst seen in 2018 is not present currently. L5-S1: Disc bulge. Facet degeneration and hypertrophy. Stenosis of both subarticular lateral recesses that would have some potential to affect the S1 nerves. Slight worsening since 2018. L3-4: Disc bulge. Facet and ligamentous hypertrophy. Mild narrowing of the lateral  recesses and foramina but without definite neural compression. Similar appearance to the study of 2018. Also says she had imaging through [facility] showing a tear in her gluteus minimus and a tear in her hamstring.  History of Hypertension treated with Maxzide  35.5-25 mg daily. Blood Pressure: normotensive today at 130/80.   History of Hyperlipidemia treated with  Rosuvastatin  5 mg daily. 06/19/2023 Lipid Panel: WNL. 08/2021 Coronary Calcium  Score: 0  History of Impaired Glucose Tolerance with 06/16/2022 HgbA1c 6.2.   History of Left Renal Artery Stenosis; S/p Left Renal Artery PTCA & Stenting 2005 w/ replacement in 2007; Chronic Kidney Disease, stage III with 06/19/2023, compared to 06/2022: BUN 31, elevated from 30; Creatinine 1.28, decreased from 1.41. Followed by [facility], where she was last seen 12/20/2022.  History of GERD treated with Nexium 20 mg daily.  History of Gout treated with Allopurinol  100 mg daily.   History of Allergic Rhinitis treated with Allegra 180 mg daily and Veramyst nasal spray.   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      Labs 06/19/2023 CBC: WNL CMP, compared to 06/2022: ; otherwise WNL.   TSH: 1.93  Says she is no longer seeing her gynecologist and would like her pelvic exams/pap smears done through this office. Pelvic exam done today.    Mammogram 04/05/2023  normal  with repeat recommendation of 2026.  Colonoscopy 10/21/2022 found External/Small Internal Hemorrhoids ; Multiple Medium-mouthed Diverticula in the descending, sigmoid, and transverse colon; otherwise normal. Pathology found colonic mucosa w/ no significant pathologic changes, negative for lymphocytic/collagenous colitis, active colitis, or dysplasia.  Bone Density 04/05/2023  T-score 1/3 Left Distal Radius  - 0.2, normal.   Vaccine Counseling: Due for Covid-19 - deferred until autumn; UTD on Flu, Shingles 2/2, PNA, and Tdap. Past  Medical History:  Diagnosis Date  Allergy    Arthritis    Atrophic vaginitis    Cataract    Degenerative arthritis of hip 11/11/2011   GERD (gastroesophageal reflux disease)    H/O hiatal hernia    Hepatitis    hepatitis b  antibodies 1970's   Hyperlipidemia    Hypertension    Renal artery stenosis (HCC)    SUI (stress urinary incontinence, female)    Medical/Surgical History Narrative:   Allergic/Intolerant to:  Allergies  Allergen Reactions   Amoxicillin  Anaphylaxis   Keflex  [Cephalexin ] Hives and Itching    "Uncontrollable itching"    Augmentin  [Amoxicillin -Pot Clavulanate] Nausea And Vomiting    Has patient had a PCN reaction causing immediate rash, facial/tongue/throat swelling, SOB or lightheadedness with hypotension: No Has patient had a PCN reaction causing severe rash involving mucus membranes or skin necrosis: No Has patient had a PCN reaction that required hospitalization: No Has patient had a PCN reaction occurring within the last 10 years: No If all of the above answers are "NO", then may proceed with Cephalosporin use.    Oseltamivir  Nausea Only and Nausea And Vomiting   Tamiflu  [Oseltamivir  Phosphate] Nausea And Vomiting  2023 - Right Eye Surgery by Dr. Seward Dao on 3/05 for Vitreomacular Adhesion  2021 - Left Carpal Tunnel Release by Dr. Donzella Galley in December; had left thumb triggering that required A1 pulley release.   2013 - Lumbar Spinal Stenosis treated w/ epidural steroid injection. Right Hip Arthroplasty by Dr. Lucienne Ryder.   2008 - Bilateral Capsulectomy  1993 - Carpal Tunnel Release  1990/1995 - Arthroscopic Surgery, Both Knees in 1990 & 1995 by Dr. 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 1 w/ hx of Stroke/ Brain Aneurysm Social History   Social History Narrative   Has a 4-year college degree. Works as a Human resources officer. Married. Lacto-ovo vegetarian. Social alcohol consumption.    Review of Systems  Constitutional:  Negative for chills, fever, malaise/fatigue and weight loss.  HENT:  Negative for hearing loss, sinus pain and sore throat.   Respiratory:  Negative for cough, hemoptysis and shortness of breath.   Cardiovascular:  Negative for chest pain, palpitations, leg swelling and PND.  Gastrointestinal:  Negative for abdominal pain, constipation, diarrhea, heartburn, nausea and vomiting.  Genitourinary:  Negative for dysuria, frequency and urgency.  Musculoskeletal:  Positive for back pain (right, lower). Negative for myalgias and neck pain.  Skin:  Negative for itching and rash.  Neurological:  Negative for dizziness, tingling, seizures and headaches.  Endo/Heme/Allergies:  Negative for polydipsia.  Psychiatric/Behavioral:  Negative for depression. The patient is not nervous/anxious.     Objective:  Vitals: BP 130/80   Pulse 78   Ht 4' 11.5" (1.511 m)   Wt 149 lb (67.6 kg)   SpO2 98%   BMI 29.59 kg/m  Physical  Exam Vitals and nursing note reviewed.  Constitutional:      General: She is not in acute distress.    Appearance: Normal appearance. She is not ill-appearing or toxic-appearing.  HENT:     Head: Normocephalic and atraumatic.     Right Ear: Hearing, tympanic membrane, ear canal and external ear normal.     Left Ear: Hearing, tympanic membrane, ear canal and external ear normal.     Mouth/Throat:      Pharynx: Oropharynx is clear.  Eyes:     Extraocular Movements: Extraocular movements intact.     Pupils: Pupils are equal, round, and reactive to light.  Neck:     Thyroid : No thyroid  mass, thyromegaly or thyroid  tenderness.     Vascular: No carotid bruit.  Cardiovascular:     Rate and Rhythm: Normal rate and regular rhythm. No extrasystoles are present.    Pulses:          Dorsalis pedis pulses are 1+ on the right side and 1+ on the left side.       Posterior tibial pulses are 1+ on the right side and 1+ on the left side.     Heart sounds: Normal heart sounds. No murmur heard.    No friction rub. No gallop.  Pulmonary:     Effort: Pulmonary effort is normal.     Breath sounds: Normal breath sounds. No decreased breath sounds, wheezing, rhonchi or rales.  Chest:     Chest wall: No mass.  Breasts:    Right: Normal. No mass or tenderness.     Left: Normal. No mass or tenderness.  Abdominal:     Palpations: Abdomen is soft. There is no hepatomegaly, splenomegaly or mass.     Tenderness: There is no abdominal tenderness.     Hernia: No hernia is present.  Genitourinary:    Rectum: Guaiac result negative.  Musculoskeletal:     Cervical back: Normal range of motion.     Right lower leg: No edema.     Left lower leg: No edema.  Lymphadenopathy:     Cervical: No cervical adenopathy.     Upper Body:     Right upper body: No supraclavicular adenopathy.     Left upper body: No supraclavicular adenopathy.  Skin:    General: Skin is warm and dry.  Neurological:     General: No focal deficit present.     Mental Status: She is alert and oriented to person, place, and time. Mental status is at baseline.     Sensory: Sensation is intact.     Motor: Motor function is intact. No weakness.     Deep Tendon Reflexes: Reflexes are normal and symmetric.  Psychiatric:        Attention and Perception: Attention normal.        Mood and Affect: Mood normal.        Speech: Speech normal.         Behavior: Behavior normal.        Thought Content: Thought content normal.        Cognition and Memory: Cognition normal.        Judgment: Judgment normal.   Most Recent Functional Status Assessment:    06/26/2023    3:02 PM  In your present state of health, do you have any difficulty performing the following activities:  Hearing? 0  Vision? 0  Difficulty concentrating or making decisions? 0  Walking or climbing stairs? 1  Dressing or bathing? 1  Doing errands, shopping? 0  Preparing Food and eating ? N  Using the Toilet? N  In the past six months, have you accidently leaked urine? N  Do you have problems with loss of bowel control? N  Managing your Medications? N  Managing your Finances? N  Housekeeping or managing your Housekeeping? N   Most Recent Fall Risk Assessment:    06/26/2023    3:02 PM  Fall Risk   Falls in the past year? 0  Number falls in past yr: 0  Injury with Fall? 0  Risk for fall due to : Other (Comment)  Follow up Falls evaluation completed;Education provided;Falls prevention discussed   Most Recent Depression Screenings:    06/26/2023    3:02 PM 07/08/2022   12:43 PM  PHQ 2/9 Scores  PHQ - 2 Score 0 0   Most Recent Cognitive Screening:    06/26/2023    3:03 PM  6CIT Screen  What Year? 0 points  What month? 0 points  What time? 0 points  Count back from 20 0 points  Months in reverse 0 points  Repeat phrase 0 points  Total Score 0 points   Results:  Studies Obtained And Personally Reviewed By Me:  Mammogram 04/05/2023  normal  with repeat recommendation of 2026.  Colonoscopy 10/21/2022 found External/Small Internal Hemorrhoids ; Multiple Medium-mouthed Diverticula in the descending, sigmoid, and transverse colon; otherwise normal. Pathology found colonic mucosa w/ no significant pathologic changes, negative for lymphocytic/collagenous colitis, active colitis, or dysplasia.  Bone Density 04/05/2023  T-score 1/3 Left Distal Radius  - 0.2, normal.    Narrative & Impression  CLINICAL DATA:  Low back pain. Cauda equina syndrome suspected. Pain on the right extending into the leg.   EXAM: MRI LUMBAR SPINE WITHOUT CONTRAST   TECHNIQUE: Multiplanar, multisequence MR imaging of the lumbar spine was performed. No intravenous contrast was administered.   COMPARISON:  Radiography 05/31/2023.  MRI 11/09/2016.   FINDINGS: Segmentation:  5 lumbar type vertebral bodies.   Alignment:  Degenerative anterolisthesis at L4-5 of 4 mm.   Vertebrae: Benign appearing hemangioma within the T12 vertebral body.   Conus medullaris and cauda equina: Conus extends to the L1 level. Conus and cauda equina appear normal.   Paraspinal and other soft tissues: Chronic renal atrophy on the left.   Disc levels:   Mild non-compressive disc bulges from T10-11 through L2-3. Mild facet hypertrophy at L2-3 but no significant encroachment.   L3-4: Bulging of the disc. Bilateral facet and ligamentous hypertrophy. Mild narrowing of the lateral recesses and foramina but no definite neural compression. Similar appearance to the study of 2018.   L4-5: Chronic facet arthropathy with degenerative anterolisthesis of 4 mm. Mild bulging of the disc. Stenosis of both lateral recesses. Large synovial cyst seen in 2018 is not present currently. The lateral recess stenosis at this level could be symptomatic.   L5-S1: Bulging of the disc. Bilateral facet degeneration and hypertrophy. Stenosis of both subarticular lateral recesses would have some potential to affect the S1 nerves. Slight worsening since 2018.   IMPRESSION: 1. L4-5: Chronic facet arthropathy with 4 mm of anterolisthesis. Bulging of the disc. Stenosis of both lateral recesses that could possibly be symptomatic. Large synovial cyst seen in 2018 is not present currently. 2. L5-S1: Disc bulge. Facet degeneration and hypertrophy. Stenosis of both subarticular lateral recesses that would have some  potential to affect the S1 nerves. Slight worsening since 2018. 3. L3-4: Disc bulge. Facet and ligamentous hypertrophy. Mild  narrowing of the lateral recesses and foramina but without definite neural compression. Similar appearance to the study of 2018.   Labs:     Component Value Date/Time   NA 140 06/19/2023 0938   NA 140 05/27/2022 0000   K 3.9 06/19/2023 0938   CL 102 06/19/2023 0938   CO2 28 06/19/2023 0938   GLUCOSE 92 06/19/2023 0938   BUN 31 (H) 06/19/2023 0938   BUN 29 (A) 05/27/2022 0000   CREATININE 1.28 (H) 06/19/2023 0938   CALCIUM  10.1 06/19/2023 0938   PROT 7.1 06/19/2023 0938   ALBUMIN 4.3 05/27/2022 0000   AST 23 06/19/2023 0938   ALT 17 06/19/2023 0938   ALKPHOS 40 03/28/2015 0536   BILITOT 0.4 06/19/2023 0938   GFRNONAA 37 (L) 03/12/2020 1111   GFRAA 43 (L) 03/12/2020 1111    Lab Results  Component Value Date   WBC 5.7 06/19/2023   HGB 12.5 06/19/2023   HCT 38.7 06/19/2023   MCV 87.2 06/19/2023   PLT 311 06/19/2023   Lab Results  Component Value Date   CHOL 169 06/19/2023   HDL 57 06/19/2023   LDLCALC 91 06/19/2023   TRIG 113 06/19/2023   CHOLHDL 3.0 06/19/2023   Lab Results  Component Value Date   HGBA1C 6.2 (H) 06/16/2022    Lab Results  Component Value Date   TSH 1.93 06/19/2023    Results for orders placed or performed in visit on 06/26/23  IFOBT POC (occult bld, rslt in office)  Result Value Ref Range   IFOBT Negative    Assessment & Plan:   Orders Placed This Encounter  Procedures   IFOBT POC (occult bld, rslt in office)  No orders of the defined types were placed in this encounter. Other Labs Reviewed today: CBC: WNL CMP, compared to 06/2022: ; otherwise WNL.   TSH: 1.93  Right Back Pain has been evaluated and has been going through PT, saw a chiropractor, tried dry needling, and tried massages. Denies incident associated with back strain. Lumbar MRI on 06/21/2023 w/ Mild non-compressive disc bulges from T10-11 through L2-3.  Mild facet hypertrophy at L2-3 but no significant encroachment. L4-5: Chronic facet arthropathy with 4 mm of anterolisthesis. Bulging of the disc. Stenosis of both lateral recesses that could possibly be symptomatic. Large synovial cyst seen in 2018 is not present currently. L5-S1: Disc bulge. Facet degeneration and hypertrophy. Stenosis of both subarticular lateral recesses that would have some potential to affect the S1 nerves. Slight worsening since 2018. L3-4: Disc bulge. Facet and ligamentous hypertrophy. Mild narrowing of the lateral recesses and foramina but without definite neural compression. Similar appearance to the study of 2018. Also says she had imaging through [facility] showing a tear in her gluteus minimus and a tear in her hamstring.  Hypertension treated with Maxzide  35.5-25 mg daily. Blood Pressure: normotensive today at 130/80.   Hyperlipidemia treated with  Rosuvastatin  5 mg daily. 06/19/2023 Lipid Panel: WNL. 08/2021 Coronary Calcium  Score: 0  Impaired Glucose Tolerance with 06/16/2022 HgbA1c 6.2.   Left Renal Artery Stenosis; S/p Left Renal Artery PTCA & Stenting 2005 w/ replacement in 2007; Chronic Kidney Disease, stage III with : BUN 31, elevated from 30; Creatinine 1.28, decreased from 1.41 on 06/19/2023, compared to 06/2022. Followed by [facility], where she was last seen 12/20/2022.  GERD treated with Nexium 20 mg daily.  Gout treated with Allopurinol  100 mg daily.   Allergic Rhinitis treated with Allegra 180 mg daily and Veramyst nasal spray.  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       Pelvic exam done today.   Mammogram 04/05/2023  normal  with repeat recommendation of 2026.  Colonoscopy 10/21/2022 found External/Small Internal Hemorrhoids ; Multiple Medium-mouthed Diverticula in the descending, sigmoid, and transverse colon; otherwise normal. Pathology found colonic mucosa w/ no significant pathologic changes,  negative for lymphocytic/collagenous colitis, active colitis, or dysplasia.  Bone Density 04/05/2023  T-score 1/3 Left Distal Radius  - 0.2, normal.   Vaccine Counseling: Due for Covid-19 - deferred until autumn; UTD on Flu, Shingles 2/2, PNA, and Tdap.   Annual wellness visit done today including the all of the following: Reviewed patient's Family Medical History Reviewed and updated list of patient's medical providers Assessment of cognitive impairment was done Assessed patient's functional ability Established a written schedule for health screening services Health Risk Assessent Completed and Reviewed  Discussed health benefits of physical activity, and encouraged her to engage in regular exercise appropriate for her age and condition.    I,Emily Lagle,acting as a Neurosurgeon for Sylvan Evener, MD.,have documented all relevant documentation on the behalf of Sylvan Evener, MD,as directed by  Sylvan Evener, MD while in the presence of Sylvan Evener, MD.   I, Sylvan Evener, MD, have reviewed all documentation for this visit. The documentation on 07/05/23 for the exam, diagnosis, procedures, and orders are all accurate and complete.

## 2023-06-22 ENCOUNTER — Other Ambulatory Visit: Payer: Self-pay

## 2023-06-22 DIAGNOSIS — G8929 Other chronic pain: Secondary | ICD-10-CM

## 2023-06-23 ENCOUNTER — Telehealth: Payer: Self-pay | Admitting: Physical Medicine and Rehabilitation

## 2023-06-23 NOTE — Telephone Encounter (Signed)
 Patient called and waiting on a call to get scheduled for her lumbar injection. She is going out of town at the end of the month and will like to get in before she goes please. CB#[phone]

## 2023-06-26 ENCOUNTER — Encounter: Payer: Self-pay | Admitting: Internal Medicine

## 2023-06-26 ENCOUNTER — Ambulatory Visit (INDEPENDENT_AMBULATORY_CARE_PROVIDER_SITE_OTHER): Payer: Medicare Other | Admitting: Internal Medicine

## 2023-06-26 VITALS — BP 130/80 | HR 78 | Ht 59.5 in | Wt 149.0 lb

## 2023-06-26 DIAGNOSIS — Z1211 Encounter for screening for malignant neoplasm of colon: Secondary | ICD-10-CM

## 2023-06-26 DIAGNOSIS — Z905 Acquired absence of kidney: Secondary | ICD-10-CM | POA: Diagnosis not present

## 2023-06-26 DIAGNOSIS — Z0000 Encounter for general adult medical examination without abnormal findings: Secondary | ICD-10-CM | POA: Diagnosis not present

## 2023-06-26 DIAGNOSIS — Z8719 Personal history of other diseases of the digestive system: Secondary | ICD-10-CM

## 2023-06-26 DIAGNOSIS — I129 Hypertensive chronic kidney disease with stage 1 through stage 4 chronic kidney disease, or unspecified chronic kidney disease: Secondary | ICD-10-CM | POA: Diagnosis not present

## 2023-06-26 DIAGNOSIS — R3989 Other symptoms and signs involving the genitourinary system: Secondary | ICD-10-CM | POA: Diagnosis not present

## 2023-06-26 DIAGNOSIS — I701 Atherosclerosis of renal artery: Secondary | ICD-10-CM | POA: Diagnosis not present

## 2023-06-26 DIAGNOSIS — Z8709 Personal history of other diseases of the respiratory system: Secondary | ICD-10-CM

## 2023-06-26 DIAGNOSIS — Z96641 Presence of right artificial hip joint: Secondary | ICD-10-CM | POA: Diagnosis not present

## 2023-06-26 DIAGNOSIS — E7800 Pure hypercholesterolemia, unspecified: Secondary | ICD-10-CM | POA: Diagnosis not present

## 2023-06-26 DIAGNOSIS — N1832 Chronic kidney disease, stage 3b: Secondary | ICD-10-CM | POA: Diagnosis not present

## 2023-06-26 DIAGNOSIS — Z8679 Personal history of other diseases of the circulatory system: Secondary | ICD-10-CM

## 2023-06-26 DIAGNOSIS — M109 Gout, unspecified: Secondary | ICD-10-CM | POA: Diagnosis not present

## 2023-06-26 DIAGNOSIS — I4510 Unspecified right bundle-branch block: Secondary | ICD-10-CM

## 2023-06-26 DIAGNOSIS — Z124 Encounter for screening for malignant neoplasm of cervix: Secondary | ICD-10-CM | POA: Diagnosis not present

## 2023-06-26 DIAGNOSIS — I10 Essential (primary) hypertension: Secondary | ICD-10-CM | POA: Diagnosis not present

## 2023-06-26 LAB — IFOBT (OCCULT BLOOD): IFOBT: NEGATIVE

## 2023-06-27 ENCOUNTER — Other Ambulatory Visit (HOSPITAL_COMMUNITY)
Admission: RE | Admit: 2023-06-27 | Discharge: 2023-06-27 | Disposition: A | Discharge status: Home / Self Care | Source: Ambulatory Visit | Attending: Internal Medicine | Admitting: Internal Medicine

## 2023-06-27 DIAGNOSIS — Z124 Encounter for screening for malignant neoplasm of cervix: Secondary | ICD-10-CM | POA: Insufficient documentation

## 2023-06-28 LAB — CYTOLOGY - PAP: Diagnosis: NEGATIVE

## 2023-07-05 ENCOUNTER — Other Ambulatory Visit: Payer: Self-pay

## 2023-07-05 ENCOUNTER — Ambulatory Visit: Admitting: Physical Medicine and Rehabilitation

## 2023-07-05 VITALS — BP 133/71 | HR 64

## 2023-07-05 DIAGNOSIS — M5416 Radiculopathy, lumbar region: Secondary | ICD-10-CM

## 2023-07-05 MED ORDER — METHYLPREDNISOLONE ACETATE 40 MG/ML IJ SUSP
40.0000 mg | Freq: Once | INTRAMUSCULAR | Status: AC
  Administered 2023-07-05: 40 mg

## 2023-07-05 NOTE — Patient Instructions (Signed)

## 2023-07-05 NOTE — Progress Notes (Signed)
 Pain Scale   Average Pain 2 Patient advising her pain increases when standing and walking, patient advises she has pain trying to get out of Bed and patient advising ICE helps         +Driver, -BT, -Dye Allergies.

## 2023-07-05 NOTE — Patient Instructions (Signed)
 It was a pleasure to see you today. Pap done at patient request. Labs are stable. RTC in one year or as needed.

## 2023-07-12 NOTE — Procedures (Signed)
 Lumbar Epidural Steroid Injection - Interlaminar Approach with Fluoroscopic Guidance  Patient: Charlotte Duarte      Date of Birth: Oct 26, 1940 MRN: [id number] PCP: Sylvan Evener, MD      Visit Date: 07/05/2023   Universal Protocol:     Consent Given By: the patient  Position: PRONE  Additional Comments: Vital signs were monitored before and after the procedure. Patient was prepped and draped in the usual sterile fashion. The correct patient, procedure, and site was verified.   Injection Procedure Details:   Procedure diagnoses: Lumbar radiculopathy [M54.16]   Meds Administered:  Meds ordered this encounter  Medications   methylPREDNISolone  acetate (DEPO-MEDROL ) injection 40 mg     Laterality: Right  Location/Site:  L5-S1  Needle: 3.5 in., 20 ga. Tuohy  Needle Placement: Paramedian epidural  Findings:   -Comments: Excellent flow of contrast into the epidural space.  Procedure Details: Using a paramedian approach from the side mentioned above, the region overlying the inferior lamina was localized under fluoroscopic visualization and the soft tissues overlying this structure were infiltrated with 4 ml. of 1% Lidocaine  without Epinephrine. The Tuohy needle was inserted into the epidural space using a paramedian approach.   The epidural space was localized using loss of resistance along with counter oblique bi-planar fluoroscopic views.  After negative aspirate for air, blood, and CSF, a 2 ml. volume of Isovue-250 was injected into the epidural space and the flow of contrast was observed. Radiographs were obtained for documentation purposes.    The injectate was administered into the level noted above.   Additional Comments:  The patient tolerated the procedure well Dressing: 2 x 2 sterile gauze and Band-Aid    Post-procedure details: Patient was observed during the procedure. Post-procedure instructions were reviewed.  Patient left the clinic in stable condition.

## 2023-07-12 NOTE — Progress Notes (Signed)
 Charlotte Duarte - 83 y.o. female MRN [id number]  Date of birth: 1940-09-13  Office Visit Note: Visit Date: 07/05/2023 PCP: Sylvan Evener, MD Referred by: Sylvan Evener, MD  Subjective: Chief Complaint  Patient presents with   Lower Back - Pain   HPI:  Charlotte Duarte is a 83 y.o. female who comes in today at the request of Dr. Norberto Bear for planned Right L5-S1 Lumbar Interlaminar epidural steroid injection with fluoroscopic guidance.  The patient has failed conservative care including home exercise, medications, time and activity modification.  This injection will be diagnostic and hopefully therapeutic.  Please see requesting physician notes for further details and justification.   ROS Otherwise per HPI.  Assessment & Plan: Visit Diagnoses:    ICD-10-CM   1. Lumbar radiculopathy  M54.16 XR C-ARM NO REPORT    Epidural Steroid injection    methylPREDNISolone  acetate (DEPO-MEDROL ) injection 40 mg      Plan: No additional findings.   Meds & Orders:  Meds ordered this encounter  Medications   methylPREDNISolone  acetate (DEPO-MEDROL ) injection 40 mg    Orders Placed This Encounter  Procedures   XR C-ARM NO REPORT   Epidural Steroid injection    Follow-up: Return for visit to requesting provider as needed.   Procedures: No procedures performed  Lumbar Epidural Steroid Injection - Interlaminar Approach with Fluoroscopic Guidance  Patient: Charlotte Duarte      Date of Birth: 1940-11-22 MRN: [id number] PCP: Sylvan Evener, MD      Visit Date: 07/05/2023   Universal Protocol:     Consent Given By: the patient  Position: PRONE  Additional Comments: Vital signs were monitored before and after the procedure. Patient was prepped and draped in the usual sterile fashion. The correct patient, procedure, and site was verified.   Injection Procedure Details:   Procedure diagnoses: Lumbar radiculopathy [M54.16]   Meds Administered:  Meds ordered this encounter   Medications   methylPREDNISolone  acetate (DEPO-MEDROL ) injection 40 mg     Laterality: Right  Location/Site:  L5-S1  Needle: 3.5 in., 20 ga. Tuohy  Needle Placement: Paramedian epidural  Findings:   -Comments: Excellent flow of contrast into the epidural space.  Procedure Details: Using a paramedian approach from the side mentioned above, the region overlying the inferior lamina was localized under fluoroscopic visualization and the soft tissues overlying this structure were infiltrated with 4 ml. of 1% Lidocaine  without Epinephrine. The Tuohy needle was inserted into the epidural space using a paramedian approach.   The epidural space was localized using loss of resistance along with counter oblique bi-planar fluoroscopic views.  After negative aspirate for air, blood, and CSF, a 2 ml. volume of Isovue-250 was injected into the epidural space and the flow of contrast was observed. Radiographs were obtained for documentation purposes.    The injectate was administered into the level noted above.   Additional Comments:  The patient tolerated the procedure well Dressing: 2 x 2 sterile gauze and Band-Aid    Post-procedure details: Patient was observed during the procedure. Post-procedure instructions were reviewed.  Patient left the clinic in stable condition.   Clinical History: MRI LUMBAR SPINE WITHOUT CONTRAST   TECHNIQUE: Multiplanar, multisequence MR imaging of the lumbar spine was performed. No intravenous contrast was administered.   COMPARISON:  Radiography 05/31/2023.  MRI 11/09/2016.   FINDINGS: Segmentation:  5 lumbar type vertebral bodies.   Alignment:  Degenerative anterolisthesis at L4-5 of 4 mm.   Vertebrae: Benign appearing  hemangioma within the T12 vertebral body.   Conus medullaris and cauda equina: Conus extends to the L1 level. Conus and cauda equina appear normal.   Paraspinal and other soft tissues: Chronic renal atrophy on the left.    Disc levels:   Mild non-compressive disc bulges from T10-11 through L2-3. Mild facet hypertrophy at L2-3 but no significant encroachment.   L3-4: Bulging of the disc. Bilateral facet and ligamentous hypertrophy. Mild narrowing of the lateral recesses and foramina but no definite neural compression. Similar appearance to the study of 2018.   L4-5: Chronic facet arthropathy with degenerative anterolisthesis of 4 mm. Mild bulging of the disc. Stenosis of both lateral recesses. Large synovial cyst seen in 2018 is not present currently. The lateral recess stenosis at this level could be symptomatic.   L5-S1: Bulging of the disc. Bilateral facet degeneration and hypertrophy. Stenosis of both subarticular lateral recesses would have some potential to affect the S1 nerves. Slight worsening since 2018.   IMPRESSION: 1. L4-5: Chronic facet arthropathy with 4 mm of anterolisthesis. Bulging of the disc. Stenosis of both lateral recesses that could possibly be symptomatic. Large synovial cyst seen in 2018 is not present currently. 2. L5-S1: Disc bulge. Facet degeneration and hypertrophy. Stenosis of both subarticular lateral recesses that would have some potential to affect the S1 nerves. Slight worsening since 2018. 3. L3-4: Disc bulge. Facet and ligamentous hypertrophy. Mild narrowing of the lateral recesses and foramina but without definite neural compression. Similar appearance to the study of 2018.     Electronically Signed   By: Bettylou Brunner M.D.   On: 06/21/2023 15:35     Objective:  VS:  HT:    WT:   BMI:     BP:133/71  HR:64bpm  TEMP: ( )  RESP:  Physical Exam Vitals and nursing note reviewed.  Constitutional:      General: She is not in acute distress.    Appearance: Normal appearance. She is not ill-appearing.  HENT:     Head: Normocephalic and atraumatic.     Right Ear: External ear normal.     Left Ear: External ear normal.  Eyes:     Extraocular Movements:  Extraocular movements intact.  Cardiovascular:     Rate and Rhythm: Normal rate.     Pulses: Normal pulses.  Pulmonary:     Effort: Pulmonary effort is normal. No respiratory distress.  Abdominal:     General: There is no distension.     Palpations: Abdomen is soft.  Musculoskeletal:        General: Tenderness present.     Cervical back: Neck supple.     Right lower leg: No edema.     Left lower leg: No edema.     Comments: Patient has good distal strength with no pain over the greater trochanters.  No clonus or focal weakness.  Skin:    Findings: No erythema, lesion or rash.  Neurological:     General: No focal deficit present.     Mental Status: She is alert and oriented to person, place, and time.     Sensory: No sensory deficit.     Motor: No weakness or abnormal muscle tone.     Coordination: Coordination normal.  Psychiatric:        Mood and Affect: Mood normal.        Behavior: Behavior normal.      Imaging: No results found.

## 2023-07-24 ENCOUNTER — Telehealth: Payer: Self-pay | Admitting: Internal Medicine

## 2023-07-24 ENCOUNTER — Ambulatory Visit: Payer: Self-pay

## 2023-07-24 ENCOUNTER — Ambulatory Visit: Admitting: Internal Medicine

## 2023-07-24 ENCOUNTER — Encounter: Payer: Self-pay | Admitting: Internal Medicine

## 2023-07-24 DIAGNOSIS — R21 Rash and other nonspecific skin eruption: Secondary | ICD-10-CM

## 2023-07-24 NOTE — Telephone Encounter (Signed)
 Chief Complaint:  Possible Poison Ivy rash   Symptoms:  -Red, flat, rash on her Left forearm, left ear and neck  Onset  3 days ago   Patient denies  Fever, blisters, oozing, spreading to facial area, genitals being involved.   Disposition: [ ] ED /[ ] Urgent Care (no appt availability in office) / [ X]Appointment(In office/virtual)/ [ ]  [facility]/ [ ] Home Care/ [ ] Refused Recommended Disposition /[ ] [facility]/ [ ]  Follow-up with PCP   Additional Notes:  Virtual visit scheduled for patient appropriately.  Patient educated on pertinent s/s that would warrant emergent help/call 911/ ED/UC.  Patient verbalized understanding and agrees with plan . No additional questions/concerns noted during the time of the call.     Complete triage note below:         Copied from CRM [id number]. Topic: Clinical - Medication Question >> Jul 24, 2023  9:04 AM Tiffany S wrote: Reason for CRM: Patient is requesting call back patient stated she may have poision ivy and wanted to see if the provider will call something for her Reason for Disposition  MODERATE to SEVERE itching (e.g., interferes with work, school, sleep, or other activities)  Answer Assessment - Initial Assessment Questions 1. APPEARANCE of RASH: "Describe the rash."      -----red, flat.    2. LOCATION: "Where is the rash located?"  (e.g., face, genitals, hands, legs)   -----  Left Forearm, L. Ear, neck    3. SIZE: "How large is the rash?"      ----Large     4. ONSET: "When did the rash begin?"     -------- 3 days ago    5. ITCHING: "Does the rash itch?" If Yes, ask: "How bad is it?"   - MILD - doesn't interfere with normal activities   - MODERATE-SEVERE: interferes with work, school, sleep, or other activities     ----- Moderate itching   6. EXPOSURE:  "How were you exposed to the plant (poison ivy, poison oak, sumac)"  "When were you exposed?"        Unsure    7. PAST HISTORY: "Have you had  a poison ivy rash before?" If Yes, ask: "How bad was it?"    ---n/a  Protocols used: Poison Ivy - Oak - Sumac-A-AH

## 2023-07-24 NOTE — Telephone Encounter (Signed)
 I attempted to connect with patient who had booked a video visit with from [place]  where she is visiting. She has rash she believes poison ivy and would like treatment. Has at least 2 areas affected but not eyes. We have suggested she find an urgent care care in the area for treatment. Video will not let me connect today saying I am not licensed in [place] . Explained to patient and she will try to find an urgent care in the area. MJB, MD

## 2023-08-02 ENCOUNTER — Telehealth: Payer: Self-pay | Admitting: Orthopaedic Surgery

## 2023-08-02 ENCOUNTER — Other Ambulatory Visit: Payer: Self-pay

## 2023-08-02 DIAGNOSIS — G8929 Other chronic pain: Secondary | ICD-10-CM

## 2023-08-02 DIAGNOSIS — M79604 Pain in right leg: Secondary | ICD-10-CM

## 2023-08-02 DIAGNOSIS — M5416 Radiculopathy, lumbar region: Secondary | ICD-10-CM

## 2023-08-02 NOTE — Telephone Encounter (Signed)
 Patient called and said she wanted to let you know how she is doing. The injection did well for two weeks and she thinks she needs PT. [place]. CB#[phone]

## 2023-08-02 NOTE — Telephone Encounter (Signed)
 Referral placed.

## 2023-08-03 ENCOUNTER — Ambulatory Visit (INDEPENDENT_AMBULATORY_CARE_PROVIDER_SITE_OTHER): Admitting: Internal Medicine

## 2023-08-03 ENCOUNTER — Encounter: Payer: Self-pay | Admitting: Internal Medicine

## 2023-08-03 ENCOUNTER — Telehealth: Payer: Self-pay | Admitting: Orthopaedic Surgery

## 2023-08-03 VITALS — BP 110/80 | HR 80 | Ht 59.5 in

## 2023-08-03 DIAGNOSIS — L255 Unspecified contact dermatitis due to plants, except food: Secondary | ICD-10-CM

## 2023-08-03 DIAGNOSIS — S20461A Insect bite (nonvenomous) of right back wall of thorax, initial encounter: Secondary | ICD-10-CM

## 2023-08-03 DIAGNOSIS — W57XXXA Bitten or stung by nonvenomous insect and other nonvenomous arthropods, initial encounter: Secondary | ICD-10-CM | POA: Diagnosis not present

## 2023-08-03 MED ORDER — METHYLPREDNISOLONE 4 MG PO TABS
ORAL_TABLET | ORAL | 0 refills | Status: DC
Start: 2023-08-03 — End: 2023-10-13

## 2023-08-03 NOTE — Progress Notes (Signed)
 Patient Care Team: Sylvan Evener, MD as PCP - General (Internal Medicine) Verlena Glenn, MD as Consulting Physician (Nephrology) Amos Balint, Emerge (Specialist) Sheffield, Kelli R, PA-C as Physician Assistant (Dermatology)  Visit Date: 08/03/23  Subjective:   Chief Complaint  Patient presents with   [facility]    Patient has a rash on her left forearm, left hip and neck. Has had it for 10 days.    Patient ID:Charlotte Duarte, [place] DOB:07-19-1940,83 y.o. ZOX:[id number]   83 y.o.Female presents today for acute visit with Rash, Left Arm/Hip/Neck x10 days. Says that rash started 10 days ago on her left arm before moving to chest and left hip, has been itching.   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   Hyperlipidemia    Hypertension    Renal artery stenosis (HCC)    SUI (stress urinary incontinence, female)     Allergies  Allergen Reactions   Amoxicillin  Anaphylaxis   Keflex  [Cephalexin ] Hives and Itching    Uncontrollable itching    Augmentin  [Amoxicillin -Pot Clavulanate] Nausea And Vomiting    Has patient had a PCN reaction causing immediate rash, facial/tongue/throat swelling, SOB or lightheadedness with hypotension: No Has patient had a PCN reaction causing severe rash involving mucus membranes or skin necrosis: No Has patient had a PCN reaction that required hospitalization: No Has patient had a PCN reaction occurring within the last 10 years: No If all of the above answers are NO, then may proceed with Cephalosporin use.    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 Married. Lacto-ovo vegetarian. Social alcohol consumption.    Review of Systems  Skin:  Positive for itching and rash (multiple sites, striating).     Objective:  Vitals: BP 110/80   Pulse 80   Ht 4' 11.5 (1.511 m)   SpO2 98%   BMI 29.59 kg/m   Physical Exam Vitals and nursing note reviewed.  Constitutional:      General: She is not in acute distress.    Appearance: Normal appearance. She is not toxic-appearing.  HENT:     Head: Normocephalic and atraumatic.  Pulmonary:     Effort: Pulmonary effort is normal.   Skin:    General: Skin is warm and dry.     Comments: Left anterior thigh with striating erythematous confluent scaling rash  Left anterior arm with linear lesion red striating rash  Insect bite right upper back   Neurological:     Mental Status: She is alert and oriented to person, place, and time. Mental status is at baseline.   Psychiatric:        Mood and Affect: Mood normal.        Behavior: Behavior normal.        Thought Content: Thought content normal.  Judgment: Judgment normal.    Results:  Studies Obtained And Personally Reviewed By Me: Labs:     Component Value Date/Time   NA 140 06/19/2023 0938   NA 140 05/27/2022 0000   K 3.9 06/19/2023 0938   CL 102 06/19/2023 0938   CO2 28 06/19/2023 0938   GLUCOSE 92 06/19/2023 0938   BUN 31 (H) 06/19/2023 0938   BUN 29 (A) 05/27/2022 0000   CREATININE 1.28 (H) 06/19/2023 0938   CALCIUM  10.1 06/19/2023 0938   PROT 7.1 06/19/2023 0938   ALBUMIN 4.3 05/27/2022 0000   AST 23 06/19/2023 0938   ALT 17 06/19/2023 0938   ALKPHOS 40 03/28/2015 0536   BILITOT 0.4 06/19/2023 0938   GFRNONAA 37 (L) 03/12/2020 1111   GFRAA 43 (L) 03/12/2020 1111    Lab Results  Component Value Date   WBC 5.7 06/19/2023   HGB 12.5 06/19/2023   HCT  38.7 06/19/2023   MCV 87.2 06/19/2023   PLT 311 06/19/2023   Lab Results  Component Value Date   CHOL 169 06/19/2023   HDL 57 06/19/2023   LDLCALC 91 06/19/2023   TRIG 113 06/19/2023   CHOLHDL 3.0 06/19/2023   Lab Results  Component Value Date   HGBA1C 6.2 (H) 06/16/2022    Lab Results  Component Value Date   TSH 1.93 06/19/2023   Assessment & Plan:   Meds ordered this encounter  Medications   methylPREDNISolone  (MEDROL ) 4 MG tablet    Sig: Take in tapering course as directed 6-5-4-3-2-1    Dispense:  21 tablet    Refill:  0     Dx: Contact Dermatitis due to Plant;            Insect Bite (non-venomous), Right Upper Back: dermatitis likely poison ivy based of striating rash visualized on exam; single insect bite noted on right upper back on exam. Have prescribed Medrol  4 mg (#21 tabs) to take in tapering dose as directed 6-5-4-3-2-1. Insect bite appears to be resolving.     I,Emily Lagle,acting as a Neurosurgeon for Sylvan Evener, MD.,have documented all relevant documentation on the behalf of Sylvan Evener, MD,as directed by  Sylvan Evener, MD while in the presence of Sylvan Evener, MD.   I, Sylvan Evener, MD, have reviewed all documentation for this visit. The documentation on 08/06/23 for the exam, diagnosis, procedures, and orders are all accurate and complete.

## 2023-08-03 NOTE — Telephone Encounter (Signed)
 Pt is requesting to be sent to PT, somewhere close to where she resides in [place], [place].

## 2023-08-06 NOTE — Patient Instructions (Addendum)
 Insect bite appears to be resolving and requires no additional treatment. Take Medrol  in tapering course as directed  - Take Six 4 mg tabs by mouth on day 1 and decrease by one tab daily 6-5-4-3-2-1. Call if not better in 72 hours or sooner if worse,

## 2023-08-21 DIAGNOSIS — R2681 Unsteadiness on feet: Secondary | ICD-10-CM | POA: Diagnosis not present

## 2023-08-21 DIAGNOSIS — M6281 Muscle weakness (generalized): Secondary | ICD-10-CM | POA: Diagnosis not present

## 2023-08-21 DIAGNOSIS — M5441 Lumbago with sciatica, right side: Secondary | ICD-10-CM | POA: Diagnosis not present

## 2023-08-21 DIAGNOSIS — R293 Abnormal posture: Secondary | ICD-10-CM | POA: Diagnosis not present

## 2023-08-24 ENCOUNTER — Telehealth: Payer: Self-pay | Admitting: Physical Medicine and Rehabilitation

## 2023-08-24 DIAGNOSIS — M6281 Muscle weakness (generalized): Secondary | ICD-10-CM | POA: Diagnosis not present

## 2023-08-24 DIAGNOSIS — M5441 Lumbago with sciatica, right side: Secondary | ICD-10-CM | POA: Diagnosis not present

## 2023-08-24 DIAGNOSIS — M5416 Radiculopathy, lumbar region: Secondary | ICD-10-CM

## 2023-08-24 DIAGNOSIS — R2681 Unsteadiness on feet: Secondary | ICD-10-CM | POA: Diagnosis not present

## 2023-08-24 DIAGNOSIS — R293 Abnormal posture: Secondary | ICD-10-CM | POA: Diagnosis not present

## 2023-08-24 NOTE — Telephone Encounter (Signed)
 I put in referral for repeat Right L5-S1 interlam

## 2023-08-24 NOTE — Telephone Encounter (Signed)
 PT CALLED REQUESTING AN APPT WITH [facility]. LAST INJECTION 06/2023. Pt phone number is [phone]

## 2023-09-04 DIAGNOSIS — M5441 Lumbago with sciatica, right side: Secondary | ICD-10-CM | POA: Diagnosis not present

## 2023-09-04 DIAGNOSIS — M6281 Muscle weakness (generalized): Secondary | ICD-10-CM | POA: Diagnosis not present

## 2023-09-04 DIAGNOSIS — R2681 Unsteadiness on feet: Secondary | ICD-10-CM | POA: Diagnosis not present

## 2023-09-04 DIAGNOSIS — R293 Abnormal posture: Secondary | ICD-10-CM | POA: Diagnosis not present

## 2023-09-06 DIAGNOSIS — R2681 Unsteadiness on feet: Secondary | ICD-10-CM | POA: Diagnosis not present

## 2023-09-06 DIAGNOSIS — M5441 Lumbago with sciatica, right side: Secondary | ICD-10-CM | POA: Diagnosis not present

## 2023-09-06 DIAGNOSIS — M6281 Muscle weakness (generalized): Secondary | ICD-10-CM | POA: Diagnosis not present

## 2023-09-06 DIAGNOSIS — R293 Abnormal posture: Secondary | ICD-10-CM | POA: Diagnosis not present

## 2023-09-15 DIAGNOSIS — M5441 Lumbago with sciatica, right side: Secondary | ICD-10-CM | POA: Diagnosis not present

## 2023-09-15 DIAGNOSIS — R2681 Unsteadiness on feet: Secondary | ICD-10-CM | POA: Diagnosis not present

## 2023-09-15 DIAGNOSIS — M6281 Muscle weakness (generalized): Secondary | ICD-10-CM | POA: Diagnosis not present

## 2023-09-15 DIAGNOSIS — R293 Abnormal posture: Secondary | ICD-10-CM | POA: Diagnosis not present

## 2023-09-20 ENCOUNTER — Other Ambulatory Visit: Payer: Self-pay

## 2023-09-20 ENCOUNTER — Ambulatory Visit: Admitting: Physical Medicine and Rehabilitation

## 2023-09-20 VITALS — BP 146/85 | HR 74

## 2023-09-20 DIAGNOSIS — M5416 Radiculopathy, lumbar region: Secondary | ICD-10-CM

## 2023-09-20 MED ORDER — METHYLPREDNISOLONE ACETATE 40 MG/ML IJ SUSP
40.0000 mg | Freq: Once | INTRAMUSCULAR | Status: AC
  Administered 2023-09-20: 40 mg

## 2023-09-20 NOTE — Progress Notes (Unsigned)
 Pain Scale   Average Pain 4 Patient advising she has lower back pain radiating to her right leg, pain increases when walking & standing, and decreases when lying down and applying Heat         +Driver, -BT, -Dye Allergies.

## 2023-09-20 NOTE — Patient Instructions (Signed)

## 2023-09-21 ENCOUNTER — Ambulatory Visit: Admitting: Sports Medicine

## 2023-09-21 VITALS — BP 122/82 | Ht 60.0 in | Wt 145.0 lb

## 2023-09-21 DIAGNOSIS — M25579 Pain in unspecified ankle and joints of unspecified foot: Secondary | ICD-10-CM | POA: Insufficient documentation

## 2023-09-21 DIAGNOSIS — M25571 Pain in right ankle and joints of right foot: Secondary | ICD-10-CM

## 2023-09-21 NOTE — Progress Notes (Signed)
 Charlotte Duarte - 83 y.o. female MRN [id number]  Date of birth: 1940/03/10  Office Visit Note: Visit Date: 09/20/2023 PCP: Perri Ronal PARAS, MD Referred by: Perri Ronal PARAS, MD  Subjective: Chief Complaint  Patient presents with   Lower Back - Pain   HPI:  Charlotte Duarte is a 83 y.o. female who comes in today at the request of Duwaine Pouch, FNP for planned Right L5-S1 Lumbar Interlaminar epidural steroid injection with fluoroscopic guidance.  The patient has failed conservative care including home exercise, medications, time and activity modification.  This injection will be diagnostic and hopefully therapeutic.  Please see requesting physician notes for further details and justification.  Diagnostically she did get quite a bit of relief for almost a month and a half.  It was a actually really good relief functionally as well.  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  Prior facet joint cyst that we have seen her in the remote past has gone at this point.  We are going to repeat the injection 1 time just to see if we can get some more relief from that.  Alternatively could look at a facet block although I think this really is the issue is the lateral recess stenosis.  She will continue with home exercise program and activity.   ROS Otherwise per HPI.  Assessment & Plan: Visit Diagnoses:    ICD-10-CM   1. Lumbar radiculopathy  M54.16 XR C-ARM NO REPORT    Epidural Steroid injection    methylPREDNISolone  acetate (DEPO-MEDROL ) injection 40 mg      Plan: No additional findings.   Meds & Orders:  Meds ordered this encounter  Medications   methylPREDNISolone  acetate (DEPO-MEDROL ) injection 40 mg    Orders Placed This Encounter  Procedures   XR C-ARM NO REPORT   Epidural Steroid injection    Follow-up: Return for visit to requesting provider as needed.   Procedures: No procedures performed  Lumbar Epidural Steroid Injection - Interlaminar  Approach with Fluoroscopic Guidance  Patient: Charlotte Duarte      Date of Birth: 05-21-1940 MRN: [id number] PCP: Perri Ronal PARAS, MD      Visit Date: 09/20/2023   Universal Protocol:     Consent Given By: the patient  Position: PRONE  Additional Comments: Vital signs were monitored before and after the procedure. Patient was prepped and draped in the usual sterile fashion. The correct patient, procedure, and site was verified.   Injection Procedure Details:   Procedure diagnoses: Lumbar radiculopathy [M54.16]   Meds Administered:  Meds ordered this encounter  Medications   methylPREDNISolone  acetate (DEPO-MEDROL ) injection 40 mg     Laterality: Right  Location/Site:  L5-S1  Needle: 3.5 in., 20 ga. Tuohy  Needle Placement: Paramedian epidural  Findings:   -Comments: Excellent flow of contrast into the epidural space.  Procedure Details: Using a paramedian approach from the side mentioned above, the region overlying the inferior lamina was localized under fluoroscopic visualization and the soft tissues overlying this structure were infiltrated with 4 ml. of 1% Lidocaine  without Epinephrine. The Tuohy needle was inserted into the epidural space using a paramedian approach.   The epidural space was localized using loss of resistance along with counter oblique bi-planar fluoroscopic views.  After negative aspirate for air, blood, and CSF, a 2 ml. volume of Isovue-250 was injected into the epidural space and the flow of contrast was observed. Radiographs were obtained for documentation purposes.  The injectate was administered into the level noted above.   Additional Comments:  No complications occurred Dressing: 2 x 2 sterile gauze and Band-Aid    Post-procedure details: Patient was observed during the procedure. Post-procedure instructions were reviewed.  Patient left the clinic in stable condition.   Clinical History: MRI LUMBAR SPINE WITHOUT CONTRAST    TECHNIQUE: Multiplanar, multisequence MR imaging of the lumbar spine was performed. No intravenous contrast was administered.   COMPARISON:  Radiography 05/31/2023.  MRI 11/09/2016.   FINDINGS: Segmentation:  5 lumbar type vertebral bodies.   Alignment:  Degenerative anterolisthesis at L4-5 of 4 mm.   Vertebrae: Benign appearing hemangioma within the T12 vertebral body.   Conus medullaris and cauda equina: Conus extends to the L1 level. Conus and cauda equina appear normal.   Paraspinal and other soft tissues: Chronic renal atrophy on the left.   Disc levels:   Mild non-compressive disc bulges from T10-11 through L2-3. Mild facet hypertrophy at L2-3 but no significant encroachment.   L3-4: Bulging of the disc. Bilateral facet and ligamentous hypertrophy. Mild narrowing of the lateral recesses and foramina but no definite neural compression. Similar appearance to the study of 2018.   L4-5: Chronic facet arthropathy with degenerative anterolisthesis of 4 mm. Mild bulging of the disc. Stenosis of both lateral recesses. Large synovial cyst seen in 2018 is not present currently. The lateral recess stenosis at this level could be symptomatic.   L5-S1: Bulging of the disc. Bilateral facet degeneration and hypertrophy. Stenosis of both subarticular lateral recesses would have some potential to affect the S1 nerves. Slight worsening since 2018.   IMPRESSION: 1. L4-5: Chronic facet arthropathy with 4 mm of anterolisthesis. Bulging of the disc. Stenosis of both lateral recesses that could possibly be symptomatic. Large synovial cyst seen in 2018 is not present currently. 2. L5-S1: Disc bulge. Facet degeneration and hypertrophy. Stenosis of both subarticular lateral recesses that would have some potential to affect the S1 nerves. Slight worsening since 2018. 3. L3-4: Disc bulge. Facet and ligamentous hypertrophy. Mild narrowing of the lateral recesses and foramina but without  definite neural compression. Similar appearance to the study of 2018.     Electronically Signed   By: Oneil Officer M.D.   On: 06/21/2023 15:35     Objective:  VS:  HT:    WT:   BMI:     BP:(!) 146/85  HR:74bpm  TEMP: ( )  RESP:  Physical Exam Vitals and nursing note reviewed.  Constitutional:      General: She is not in acute distress.    Appearance: Normal appearance. She is not ill-appearing.  HENT:     Head: Normocephalic and atraumatic.     Right Ear: External ear normal.     Left Ear: External ear normal.  Eyes:     Extraocular Movements: Extraocular movements intact.  Cardiovascular:     Rate and Rhythm: Normal rate.     Pulses: Normal pulses.  Pulmonary:     Effort: Pulmonary effort is normal. No respiratory distress.  Abdominal:     General: There is no distension.     Palpations: Abdomen is soft.  Musculoskeletal:        General: Tenderness present.     Cervical back: Neck supple.     Right lower leg: No edema.     Left lower leg: No edema.     Comments: Patient has good distal strength with no pain over the greater trochanters.  No clonus or  focal weakness.  Skin:    Findings: No erythema, lesion or rash.  Neurological:     General: No focal deficit present.     Mental Status: She is alert and oriented to person, place, and time.     Sensory: No sensory deficit.     Motor: No weakness or abnormal muscle tone.     Coordination: Coordination normal.  Psychiatric:        Mood and Affect: Mood normal.        Behavior: Behavior normal.      Imaging: No results found.

## 2023-09-21 NOTE — Assessment & Plan Note (Signed)
 This primarily involves her right foot.  She has excellent Ortho care shoes with a soft orthotic.  I added a wedged three-quarter length insert to correct her excess pronation and gave her a arch strap to provide some additional support to the collapsed arch.  She felt good with this and walked with a lot less pronation.  She will continue this strategy if it is working well. If not we can go ahead and try a custom orthotic again and try to make it specifically to give better support to the right foot.

## 2023-09-21 NOTE — Procedures (Signed)
 Lumbar Epidural Steroid Injection - Interlaminar Approach with Fluoroscopic Guidance  Patient: Charlotte Duarte      Date of Birth: [birth year] MRN: [id number] PCP: Perri Ronal PARAS, MD      Visit Date: 09/20/2023   Universal Protocol:     Consent Given By: the patient  Position: PRONE  Additional Comments: Vital signs were monitored before and after the procedure. Patient was prepped and draped in the usual sterile fashion. The correct patient, procedure, and site was verified.   Injection Procedure Details:   Procedure diagnoses: Lumbar radiculopathy [M54.16]   Meds Administered:  Meds ordered this encounter  Medications   methylPREDNISolone  acetate (DEPO-MEDROL ) injection 40 mg     Laterality: Right  Location/Site:  L5-S1  Needle: 3.5 in., 20 ga. Tuohy  Needle Placement: Paramedian epidural  Findings:   -Comments: Excellent flow of contrast into the epidural space.  Procedure Details: Using a paramedian approach from the side mentioned above, the region overlying the inferior lamina was localized under fluoroscopic visualization and the soft tissues overlying this structure were infiltrated with 4 ml. of 1% Lidocaine  without Epinephrine. The Tuohy needle was inserted into the epidural space using a paramedian approach.   The epidural space was localized using loss of resistance along with counter oblique bi-planar fluoroscopic views.  After negative aspirate for air, blood, and CSF, a 2 ml. volume of Isovue-250 was injected into the epidural space and the flow of contrast was observed. Radiographs were obtained for documentation purposes.    The injectate was administered into the level noted above.   Additional Comments:  No complications occurred Dressing: 2 x 2 sterile gauze and Band-Aid    Post-procedure details: Patient was observed during the procedure. Post-procedure instructions were reviewed.  Patient left the clinic in stable condition.

## 2023-09-21 NOTE — Progress Notes (Signed)
 Chief complaint bilateral foot pain right worse than left  Patient is now 34 and over the past 2 years has noticed a steady decline in her ability to walk and do physical activities.  Prior to that time she had been a very active person. Issues include lumbar radiculopathy for which she just had an injection yesterday and sees significant improvements from these lumbar injections For the past 2 years she has had increasing pain in her feet when she tries to walk or stand too long Her right foot in particular feels like it pronates a lot and wears out the inside of her shoe She has found some Ortho care shoes that help and are very well cushioned In the past when she used orthotics they may have helped some but they were difficult to work with a lot of her shoes  Other history includes a hip replacement on the right that has worked pretty well  Physical exam  pleasant older lady no acute distress BP 122/82   Ht 5' (1.524 m)   Wt 145 lb (65.8 kg)   BMI 28.32 kg/m   Right foot shows marked pronation at standing starting at the rear foot with outward rotation of the distal foot There is complete loss of the longitudinal arch Standing on her toes reveals there is still some posterior tibial function The entire right foot is widened particularly at the plantar plate She has significantly shortened first metatarsal and meets the definitions for first metatarsal insufficiency  Left foot shows moderate loss of the longitudinal arch Transverse arch is flattened but not collapsed Pronation on the left is mild  Lying position reveals some shortening of the right leg by 1 to 2 cm partly due to alignment  Walking gait shows that she gets dynamic genu valgus and significant pronation primarily on the right

## 2023-09-25 DIAGNOSIS — H353122 Nonexudative age-related macular degeneration, left eye, intermediate dry stage: Secondary | ICD-10-CM | POA: Diagnosis not present

## 2023-09-25 DIAGNOSIS — H353210 Exudative age-related macular degeneration, right eye, stage unspecified: Secondary | ICD-10-CM | POA: Diagnosis not present

## 2023-09-27 DIAGNOSIS — R293 Abnormal posture: Secondary | ICD-10-CM | POA: Diagnosis not present

## 2023-09-27 DIAGNOSIS — R2681 Unsteadiness on feet: Secondary | ICD-10-CM | POA: Diagnosis not present

## 2023-09-27 DIAGNOSIS — M6281 Muscle weakness (generalized): Secondary | ICD-10-CM | POA: Diagnosis not present

## 2023-09-27 DIAGNOSIS — M5441 Lumbago with sciatica, right side: Secondary | ICD-10-CM | POA: Diagnosis not present

## 2023-10-02 DIAGNOSIS — R293 Abnormal posture: Secondary | ICD-10-CM | POA: Diagnosis not present

## 2023-10-02 DIAGNOSIS — M6281 Muscle weakness (generalized): Secondary | ICD-10-CM | POA: Diagnosis not present

## 2023-10-02 DIAGNOSIS — R2681 Unsteadiness on feet: Secondary | ICD-10-CM | POA: Diagnosis not present

## 2023-10-02 DIAGNOSIS — M5441 Lumbago with sciatica, right side: Secondary | ICD-10-CM | POA: Diagnosis not present

## 2023-10-04 DIAGNOSIS — M5441 Lumbago with sciatica, right side: Secondary | ICD-10-CM | POA: Diagnosis not present

## 2023-10-04 DIAGNOSIS — R2681 Unsteadiness on feet: Secondary | ICD-10-CM | POA: Diagnosis not present

## 2023-10-04 DIAGNOSIS — R293 Abnormal posture: Secondary | ICD-10-CM | POA: Diagnosis not present

## 2023-10-04 DIAGNOSIS — M6281 Muscle weakness (generalized): Secondary | ICD-10-CM | POA: Diagnosis not present

## 2023-10-10 DIAGNOSIS — R293 Abnormal posture: Secondary | ICD-10-CM | POA: Diagnosis not present

## 2023-10-10 DIAGNOSIS — M6281 Muscle weakness (generalized): Secondary | ICD-10-CM | POA: Diagnosis not present

## 2023-10-10 DIAGNOSIS — R2681 Unsteadiness on feet: Secondary | ICD-10-CM | POA: Diagnosis not present

## 2023-10-10 DIAGNOSIS — M5441 Lumbago with sciatica, right side: Secondary | ICD-10-CM | POA: Diagnosis not present

## 2023-10-11 DIAGNOSIS — H43813 Vitreous degeneration, bilateral: Secondary | ICD-10-CM | POA: Diagnosis not present

## 2023-10-12 ENCOUNTER — Telehealth: Payer: Self-pay

## 2023-10-12 NOTE — Telephone Encounter (Signed)
 Appt booked

## 2023-10-12 NOTE — Telephone Encounter (Signed)
 Copied from CRM #[id number]. Topic: Referral - Request for Referral >> Oct 12, 2023  2:06 PM Rosaria BRAVO wrote: Did the patient discuss referral with their provider in the last year? Yes.  (If No - schedule appointment) (If Yes - send message)  Appointment offered? Yes  Type of order/referral and detailed reason for visit: Seeking pain specialist for back pain. Has had two epidurals in the past 3 months  Preference of office, provider, location: Dr. Ronal Beard, pain specialist  [phone]  If referral order, have you been seen by this specialty before? No. (If Yes, this issue or another issue? When? Where?  Can we respond through MyChart? Yes

## 2023-10-13 ENCOUNTER — Ambulatory Visit (INDEPENDENT_AMBULATORY_CARE_PROVIDER_SITE_OTHER): Admitting: Internal Medicine

## 2023-10-13 ENCOUNTER — Encounter: Payer: Self-pay | Admitting: Internal Medicine

## 2023-10-13 VITALS — BP 122/84 | HR 84 | Ht 60.0 in | Wt 151.0 lb

## 2023-10-13 DIAGNOSIS — N1832 Chronic kidney disease, stage 3b: Secondary | ICD-10-CM | POA: Diagnosis not present

## 2023-10-13 DIAGNOSIS — M5416 Radiculopathy, lumbar region: Secondary | ICD-10-CM

## 2023-10-13 NOTE — Patient Instructions (Signed)
 Referral to [facility]. Will fax an order. Does not want pain med. Cannot take NSAIDS due to chronic kidney disease.

## 2023-10-13 NOTE — Progress Notes (Signed)
 Patient Care Team: Perri Charlotte PARAS, MD as PCP - General (Internal Medicine) Betsey Channel, MD as Consulting Physician (Nephrology) [facility], [facility] (Specialist) Porter Andrez SAUNDERS, PA-C (Inactive) as Physician Assistant (Dermatology)  Visit Date: 10/13/23  Subjective:   Chief Complaint  Patient presents with   Back Pain    Discuss referral     Vitals:   10/13/23 1131  BP: 122/84   Patient ID:Charlotte Duarte DOB:1940/07/17,83 y.o. FMW:[id number]   83 y.o.Female presents today for acute visit with Back Pain. Patient has a past medical history of CKD, stage III so she cannot tolerate NSAIDS.  She requests a referral to Dr. Cozetta at [facility] as for the past 5 years she has had right-sided back pain radiating down the back of her right leg to her foot with the current flare having been ongoing for 1 year. Goes on to describe that within the past year she has seen Dr. Vernetta in April, where she had a Lumbar MRI done on 4/21 that showed chronic facet arthropathy with 4 mm of anterolisthesis at L4-5, which was thought that both lateral recesses could possibly be symptomatic, as well as stenosis of both subarticular lateral recesses that would have some potential to affect the S1 nerves. She has had two epidural injections - 5/14 & 7/30 - that reportedly provided moderate relief, and has been seen at PT earlier this year without any improvement, however for the past 4 weeks has had twice weekly PT through [facility] doing balance training. She does mention that Dr. Ophelia Haddock has attributes her leg pain due to pronation of her right foot. She has tried sole inserts and strengthening exercises. Has not tried oral Prednisone .   Vaccine Counseling: discussed, she is agreeable to updating Influenza and Covid-19 in the next couple of months. 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   Hyperlipidemia    Hypertension    Renal artery stenosis (HCC)    SUI (stress urinary incontinence, female)     Allergies  Allergen Reactions   Amoxicillin  Anaphylaxis   Keflex  [Cephalexin ] Hives and Itching    Uncontrollable itching    Augmentin  [Amoxicillin -Pot Clavulanate] Nausea And Vomiting    Has patient had a PCN reaction causing immediate rash, facial/tongue/throat swelling, SOB or lightheadedness with hypotension: No Has patient had a PCN reaction causing severe rash involving mucus membranes or skin necrosis: No Has patient had a PCN reaction that required hospitalization: No Has patient had a PCN reaction occurring within the last 10 years: No If all of the above answers are NO, then may proceed with Cephalosporin use.    Oseltamivir  Nausea Only and Nausea And Vomiting   Tamiflu  [Oseltamivir  Phosphate] Nausea And Vomiting   Immunization History  Administered Date(s) Administered   Fluad Quad(high Dose 65+) 11/19/2020, 11/13/2021   Influenza, High Dose Seasonal PF 10/30/2022   Influenza,inj,Quad PF,6+ Mos 01/03/2013, 11/02/2017   Influenza,inj,Quad PF,6-35 Mos 12/14/2015   Influenza,inj,quad, With Preservative 11/21/2020   Influenza-Unspecified 11/24/2018, 12/25/2019, 11/19/2020   PFIZER(Purple Top)SARS-COV-2 Vaccination 03/13/2019, 04/01/2019, 10/27/2019   Pfizer(Comirnaty)Fall Seasonal Vaccine 12 years and older 10/30/2022   Pneumococcal Conjugate-13 06/11/2014   Pneumococcal Polysaccharide-23 01/22/2010, 12/17/2013   Respiratory Syncytial Virus Vaccine,Recomb Aduvanted(Arexvy) 12/22/2021  Tdap 11/30/2005, 09/09/2019   Unspecified SARS-COV-2 Vaccination 05/23/2019, 09/01/2020, 01/04/2021, 11/13/2021   Zoster Recombinant(Shingrix) 01/06/2021, 06/25/2021   Zoster, Live 01/06/2021   Past Surgical History:  Procedure Laterality Date    AUGMENTATION MAMMAPLASTY  1980    both breasts implants REMOVED IN 2005   BILATERAL CAPSULECTOMY  2008   CARPAL TUNNEL RELEASE  1995   right   CATARACT EXTRACTION  20 yrs ago   both eyes    EYE SURGERY     KNEE ARTHROSCOPY     BOTH KNEES   RENAL STINT     RENAL STINT LEFT RENAL ARTERY IN 2005.  RENAL STINT REPLACE 03/2005.   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 Married. Lacto-ovo vegetarian. Social alcohol consumption.    Review of Systems  Constitutional:  Negative for fever and malaise/fatigue.  HENT:  Negative for congestion.   Eyes:  Negative for blurred vision.  Respiratory:  Negative for cough and shortness of breath.   Cardiovascular:  Negative for chest pain, palpitations and leg swelling.  Gastrointestinal:  Negative for vomiting.  Musculoskeletal:  Negative for back pain.  Skin:  Negative for rash.  Neurological:  Negative for loss of consciousness and headaches.     Objective:  Vitals: BP 122/84   Pulse 84   Ht 5' (1.524 m)   Wt 151 lb (68.5 kg)   SpO2 98%   BMI 29.49 kg/m   Physical Exam Vitals and nursing note reviewed.  Constitutional:      General: She is not in acute distress.    Appearance: Normal appearance. She is not toxic-appearing.  HENT:     Head: Normocephalic and atraumatic.  Pulmonary:     Effort: Pulmonary effort is normal.  Skin:    General: Skin is warm and dry.  Neurological:     Mental Status: She is alert and oriented to person, place, and time. Mental status  is at baseline.     Motor: No weakness.     Deep Tendon Reflexes:     Reflex Scores:      Patellar reflexes are 2+ on the right side and 1+ on the left side. Psychiatric:        Mood and Affect: Mood normal.        Behavior: Behavior normal.        Thought Content: Thought content normal.        Judgment: Judgment normal.     Results:  Studies Obtained And Personally Reviewed By Me:  MRI LUMBAR SPINE WITHOUT CONTRAST 06/12/2023  COMPARISON:  Radiography 05/31/2023.  MRI 11/09/2016.   FINDINGS: Segmentation:  5 lumbar type vertebral bodies.   Alignment:  Degenerative anterolisthesis at L4-5 of 4 mm.   Vertebrae: Benign appearing hemangioma within the T12 vertebral body.   Conus medullaris and cauda equina: Conus extends to the L1 level. Conus and cauda equina appear normal.   Paraspinal and other soft tissues: Chronic renal atrophy on the left.  Disc levels:   Mild non-compressive disc bulges from T10-11 through L2-3. Mild facet hypertrophy at L2-3 but no significant encroachment.   L3-4: Bulging of the disc. Bilateral facet and ligamentous hypertrophy. Mild narrowing of the lateral recesses and foramina but no definite neural compression. Similar appearance to the study of 2018.   L4-5: Chronic facet arthropathy with degenerative anterolisthesis of 4 mm. Mild bulging of the disc. Stenosis of both lateral recesses. Large synovial cyst seen in 2018 is not present currently. The lateral recess stenosis at this level could be symptomatic.   L5-S1: Bulging of the disc. Bilateral facet degeneration and hypertrophy. Stenosis of both subarticular lateral recesses would have some potential to affect the S1 nerves. Slight worsening since 2018.   IMPRESSION: 1. L4-5: Chronic facet arthropathy with 4 mm of anterolisthesis. Bulging of the disc. Stenosis of both lateral recesses that could possibly be symptomatic. Large synovial cyst seen in 2018 is not present currently.  2.  L5-S1: Disc bulge. Facet degeneration and hypertrophy. Stenosis of both subarticular lateral recesses that would have some potential to affect the S1 nerves. Slight worsening since 2018.  3. L3-4: Disc bulge. Facet and ligamentous hypertrophy. Mild narrowing of the lateral recesses and foramina but without definite neural compression. Similar appearance to the study of 2018.     Labs:  CBC w/ Differential Lab Results  Component Value Date   WBC 5.7 06/19/2023   RBC 4.44 06/19/2023   HGB 12.5 06/19/2023   HCT 38.7 06/19/2023   PLT 311 06/19/2023   MCV 87.2 06/19/2023   MCH 28.2 06/19/2023   MCHC 32.3 06/19/2023   RDW 13.7 06/19/2023   MPV 10.1 06/19/2023   LYMPHSABS 1,387 07/08/2022   MONOABS 441 07/21/2016   BASOSABS 51 06/19/2023    Comprehensive Metabolic Panel Lab Results  Component Value Date   NA 140 06/19/2023   K 3.9 06/19/2023   CL 102 06/19/2023   CO2 28 06/19/2023   GLUCOSE 92 06/19/2023   BUN 31 (H) 06/19/2023   CREATININE 1.28 (H) 06/19/2023   CALCIUM  10.1 06/19/2023   PROT 7.1 06/19/2023   ALBUMIN 4.3 05/27/2022   AST 23 06/19/2023   ALT 17 06/19/2023   ALKPHOS 40 03/28/2015   BILITOT 0.4 06/19/2023   EGFR 48.0 12/25/2022   GFRNONAA 37 (L) 03/12/2020   Lipid Panel  Lab Results  Component Value Date   CHOL 169 06/19/2023   HDL 57 06/19/2023   LDLCALC 91 06/19/2023   TRIG 113 06/19/2023   A1c Lab Results  Component Value Date   HGBA1C 6.2 (H) 06/16/2022    TSH Lab Results  Component Value Date   TSH 1.93 06/19/2023   Assessment & Plan:   Back Pain: she requests a referral to Dr. Cozetta at [facility] as for the past 5 years she has had right-sided back pain radiating down the back of her right leg to her foot with the current flare having been ongoing for 1 year. Goes on to describe that within the past year she has seen Dr. Vernetta in April, where she had a Lumbar MRI done on 06/12/23 that showed chronic facet arthropathy with 4  mm of anterolisthesis at L4-5, which was thought that both lateral recesses could possibly be symptomatic, as well as stenosis of both subarticular lateral recesses that would have some potential to affect the S1 nerves. She has had two epidural injections - 5/14 & 7/30 - that reportedly provided moderate relief, and has been seen at  PT earlier this year without any improvement, however for the past 4 weeks has had twice weekly PT through [facility] doing balance training. She does mention that Dr. Ophelia Haddock has attributes her leg pain due to pronation of her right foot. She has tried sole inserts and strengthening exercises. Has not tried oral Prednisone .   Mutually agreed she will see [facility].  I Would like to try intensive PT before making chronic pain management   referral.   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  Vaccine Counseling: discussed, she is agreeable to updating Influenza and Covid-19 in the next couple of months.   I,Charlotte Duarte,acting as a Neurosurgeon for Charlotte JINNY Hailstone, MD.,have documented all relevant documentation on the behalf of Charlotte JINNY Hailstone, MD,as directed by  Charlotte JINNY Hailstone, MD while in the presence of Charlotte JINNY Hailstone, MD.  I, Charlotte JINNY Hailstone, MD, have reviewed all documentation for this visit. The documentation on 10/13/2023 for the exam, diagnosis, procedures, and orders are all accurate and complete.

## 2023-10-17 ENCOUNTER — Ambulatory Visit: Payer: Self-pay | Admitting: Internal Medicine

## 2023-10-17 ENCOUNTER — Ambulatory Visit: Payer: Self-pay

## 2023-10-17 NOTE — Telephone Encounter (Addendum)
 FYI Only or Action Required?: Action required by provider: No PCP availability, needs call back with further recommendations/earlier appt options, heading to UC in meantime but prefers PCP.  Patient was last seen in primary care on 10/13/2023 by Perri Ronal PARAS, MD.  Called Nurse Triage reporting Urticaria and Pruritis.  Symptoms began several days ago.  Interventions attempted: Rest, hydration, or home remedies.  Symptoms are: gradually worsening.  Triage Disposition: See Physician Within 24 Hours  Patient/caregiver understands and will follow disposition?: Yes     Reason for Disposition  [1] MODERATE-SEVERE hives (e.g.,hives interfere with normal activities or work) AND [2] not improved after taking antihistamine (e.g., cetirizine, fexofenadine, or loratadine ) > 24 hours  Answer Assessment - Initial Assessment Questions 1. APPEARANCE: What does the rash look like?      Individual spots like bites, thought might be chiggers but husband said no, doesn't look like poison ivy but can't figure out what else it is, he has no bites 2. LOCATION: Where is the rash located?      Shoulders, torso, groin area in bikini line and buttocks, not to vulva 3. NUMBER: How many hives are there?      numerous 4. SIZE: How big are the hives? (e.g., inches, cm, compare to coins) Do they all look the same or do they vary in shape and size?      Some a little swollen, most like little spots some like tip of pen 5. ONSET: When did the hives begin? (e.g., hours or days ago)      8/22, not really gone away 6. ITCHING: Does it itch? If Yes, ask: How bad is the itch?  (e.g., none, mild, moderate, severe)     Depends on time of day, 7/10 7. RECURRENT PROBLEM: Have you had hives before? If Yes, ask: When was the last time? and What happened that time?      Have had poison ivy before habitually but don't remember anything like this, recently had what Dr. Perri thought was poison ivy and  gave prednisone  and went away 8. TRIGGERS: Were you exposed to any new food, plant, cosmetic product or animal just before the hives began?     Did not feel bites One med for eyes tried for 2 days and side effects did not say anything about that, called ophthalmologist but said not anything they've seen, stopped drops right away No lake or other 9. OTHER SYMPTOMS: Do you have any other symptoms? (e.g., fever, tongue swelling, difficulty breathing, abdomen pain)     Denies No spreading or streaking redness   Advised pt be examined today for symptoms, no PCP availability, offered [facility] or [facility], pt opting to find UC closer to home. Sending message to PCP office for call back with further recommendations/earlier appt options. Advised call back if worsening symptoms.  Protocols used: Hives-A-AH

## 2023-10-17 NOTE — Telephone Encounter (Signed)
 This RN made first attempt to contact patient with no answer. A voicemail was left with call back number provided.           Copied from CRM [id number]. Topic: Clinical - Pink Word Triage >> Oct 17, 2023  8:49 AM Essie A wrote: Patient has had hives since 10/13/23. Located on shoulders, torso and groin area.  Tried Benedryl which helped the itching but made her drowsy.

## 2023-10-17 NOTE — Telephone Encounter (Signed)
 Called back no answer, will try again later. Please go to your nearest urgent care, Dr. Perri is out of the office today and tomorrow.

## 2023-10-17 NOTE — Telephone Encounter (Addendum)
 Second attempt for a triage nurse to contact pt, no answer, LVM for call back to PCP office. See other triage nurse note for 8/26.    Message from Essie A sent at 10/17/2023  8:49 AM EDT  Patient has had hives since 10/13/23. Located on shoulders, torso and groin area.  Tried Benedryl which helped the itching but made her drowsy.

## 2023-10-18 ENCOUNTER — Ambulatory Visit: Payer: Self-pay | Admitting: Internal Medicine

## 2023-10-18 ENCOUNTER — Ambulatory Visit (HOSPITAL_COMMUNITY)
Admission: RE | Admit: 2023-10-18 | Discharge: 2023-10-18 | Disposition: A | Discharge status: Home / Self Care | Source: Ambulatory Visit | Attending: Physician Assistant | Admitting: Physician Assistant

## 2023-10-18 ENCOUNTER — Encounter (HOSPITAL_COMMUNITY): Payer: Self-pay

## 2023-10-18 VITALS — BP 127/71 | HR 71 | Temp 98.5°F | Resp 18

## 2023-10-18 DIAGNOSIS — L239 Allergic contact dermatitis, unspecified cause: Secondary | ICD-10-CM | POA: Diagnosis not present

## 2023-10-18 DIAGNOSIS — L282 Other prurigo: Secondary | ICD-10-CM

## 2023-10-18 MED ORDER — PREDNISONE 10 MG (21) PO TBPK: ORAL_TABLET | ORAL | 0 refills | Status: DC

## 2023-10-18 NOTE — ED Triage Notes (Signed)
 Pt c/o itchy rash to back/shoulders/buttocks/torso since Friday. States feels like poison ivy. States used creams and benadryl  with no relief.

## 2023-10-18 NOTE — Telephone Encounter (Signed)
 FYI Only or Action Required?: FYI only for provider.  Patient was last seen in primary care on 10/13/2023 by Perri Ronal PARAS, MD.  Called Nurse Triage reporting Advice Only.  Symptoms began several days ago.  Interventions attempted: Other: see previous NT encounters 10/17/23.  Symptoms are: gradually worsening.  Triage Disposition: Information or Advice Only Call  Patient/caregiver understands and will follow disposition?: Yes          Reason for Disposition  [1] Follow-up call to recent contact AND [2] information only call, no triage required  Answer Assessment - Initial Assessment Questions Called patient to review any further needs or recommendations for continued rash, itching. Reviewed message from  A. Valencei, CMA, yesterday that PCP will not be available x 2 days and recommended to go to UC. Patient reports she already has appt for today at [facility].      1. REASON FOR CALL: What is the main reason for your call? or How can I best help you?     Patient called to report she is going to UC today for sx of rash , itching and medication request .  2. SYMPTOMS : Do you have any symptoms?      Possible poison ivy, rash, itching pustules between fingers and toes and rectal area. 3. OTHER QUESTIONS: Do you have any other questions?     no  Protocols used: Information Only Call - No Triage-A-AH

## 2023-10-18 NOTE — ED Provider Notes (Signed)
 [facility]    CSN: [id number] Arrival date & time: 10/18/23  1206      History   Chief Complaint Chief Complaint  Patient presents with   Appt-rash         HPI Charlotte Duarte is a 83 y.o. female.   Patient presents today with a 5-day history of intensely pruritic rash.  She reports that symptoms are similar previous episodes of poison ivy and she is concerned that she might been exposed while she was out gardening or by one of her pets as this has happened in the past.  She denies any additional exposures including to plants, insects, animals.  Denies any recent medication changes but did start taking a supplement at the recommendation of her retinologist for macular degeneration but discussed with him her current symptoms and he does not believe it is related to this supplement.  Denies history of glaucoma or diabetes.  In the past, she has required prednisone  to treat the symptoms and is requesting this medication if appropriate today.  She has been taking Benadryl , previously recommended fexofenadine, topical hydrocortisone and Benadryl  creams without improvement of symptoms.  She denies any fever, nausea, vomiting, shortness of breath, cough, swelling of her throat.    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   Hyperlipidemia    Hypertension    Renal artery stenosis (HCC)    SUI (stress urinary incontinence, female)     Patient Active Problem List   Diagnosis Date Noted   Pain in joint, ankle and foot 09/21/2023   Macular degeneration 04/22/2021   Stenosis of left renal artery (HCC) 04/22/2021   Vitreomacular adhesion of right eye 12/29/2020   Intermediate stage nonexudative age-related macular degeneration of both eyes 12/29/2020   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   Snores 08/10/2020   Intermediate stage nonexudative age-related macular degeneration of right eye 08/10/2020   Pseudophakia, both eyes 08/10/2020   Left posterior capsular opacification 08/10/2020   Lumbar spondylosis 06/03/2020   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/22/2019   Kidney disease 10/31/2018   Hiatal hernia 10/31/2018   Reduced libido 10/31/2018   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   Essential hypertension 03/29/2015   Spinal stenosis 09/20/2011   History of renal artery stenosis 09/20/2011   Allergic rhinitis 09/20/2011   GE reflux 02/21/2011   Atrophic vaginitis    SUI (stress urinary incontinence, female)    Hyperlipidemia 07/27/2010   RENAL DISEASE, CHRONIC, STAGE III 04/20/2009    Past Surgical History:  Procedure Laterality Date   AUGMENTATION MAMMAPLASTY  1980    both breasts implants REMOVED IN 2005   BILATERAL CAPSULECTOMY  2008   CARPAL TUNNEL RELEASE  1995   right   CATARACT EXTRACTION  20 yrs ago   both eyes    EYE SURGERY     KNEE ARTHROSCOPY     BOTH KNEES   RENAL STINT     RENAL STINT LEFT RENAL ARTERY IN 2005.  RENAL STINT REPLACE 03/2005.   TOTAL HIP ARTHROPLASTY  11/11/2011   Procedure: TOTAL HIP ARTHROPLASTY ANTERIOR APPROACH;  Surgeon: Lonni CINDERELLA Poli, MD;  Location: [facility];  Service: Orthopedics;  Laterality: Right;  Right Total Hip Arthroplasty  OB History     Gravida  1   Para      Term      Preterm      AB  1   Living         SAB      IAB      Ectopic      Multiple      Live Births               Home Medications    Prior to Admission medications   Medication Sig Start Date End Date Taking? Authorizing Provider  predniSONE  (STERAPRED UNI-PAK 21 TAB) 10 MG (21) TBPK tablet As directed 10/18/23  Yes Dayn Barich K, PA-C  allopurinol  (ZYLOPRIM ) 100 MG tablet TAKE 1 TABLET BY MOUTH DAILY 04/27/23   Perri Ronal PARAS, MD  aspirin  EC 81 MG tablet Take 81 mg by mouth  daily.    [provider]  esomeprazole (NEXIUM) 20 MG packet Take 20 mg by mouth daily before breakfast.    [provider]  fexofenadine (ALLEGRA) 180 MG tablet Take 180 mg by mouth daily. Reported on 04/09/2015    [provider]  Multiple Vitamins-Minerals (PRESERVISION AREDS 2 PO) Take by mouth.    [provider]  rosuvastatin  (CRESTOR ) 5 MG tablet TAKE 1 TABLET BY MOUTH DAILY 05/17/23   Perri Ronal PARAS, MD  triamterene -hydrochlorothiazide  (MAXZIDE ) 75-50 MG tablet TAKE 1 TABLET BY MOUTH  DAILY WITH BREAKFAST 10/15/18   Perri Ronal PARAS, MD    Family History Family History  Problem Relation Age of Onset   Mental illness Mother    Breast cancer Mother 66   Hypertension Mother    Heart disease Father    ALS Brother    Stroke Brother     Social History Social History   Tobacco Use   Smoking status: Former    Current packs/day: 0.00    Types: Cigarettes    Quit date: 07/27/1970    Years since quitting: 53.2   Smokeless tobacco: Never  Substance Use Topics   Alcohol use: Yes    Alcohol/week: 10.0 standard drinks of alcohol    Types: 10 Glasses of wine per week    Comment: 1-2 glasses of wine daily   Drug use: No     Allergies   Amoxicillin , Keflex  [cephalexin ], Augmentin  [amoxicillin -pot clavulanate], Oseltamivir , and Tamiflu  [oseltamivir  phosphate]   Review of Systems Review of Systems  Constitutional:  Positive for activity change. Negative for appetite change, fatigue and fever.  Respiratory:  Negative for shortness of breath.   Cardiovascular:  Negative for chest pain.  Gastrointestinal:  Negative for abdominal pain, diarrhea, nausea and vomiting.  Musculoskeletal:  Negative for arthralgias and myalgias.  Skin:  Positive for rash.  Neurological:  Negative for dizziness, light-headedness and headaches.     Physical Exam Triage Vital Signs ED Triage Vitals [10/18/23 1238]  Encounter Vitals Group     BP 127/71     Girls Systolic  BP Percentile      Girls Diastolic BP Percentile      Boys Systolic BP Percentile      Boys Diastolic BP Percentile      Pulse Rate 71     Resp 18     Temp 98.5 F (36.9 C)     Temp Source Oral     SpO2 95 %     Weight      Height      Head Circumference  Peak Flow      Pain Score 0     Pain Loc      Pain Education      Exclude from Growth Chart    No data found.  Updated Vital Signs BP 127/71 (BP Location: Left Arm)   Pulse 71   Temp 98.5 F (36.9 C) (Oral)   Resp 18   SpO2 95%   Visual Acuity Right Eye Distance:   Left Eye Distance:   Bilateral Distance:    Right Eye Near:   Left Eye Near:    Bilateral Near:     Physical Exam Vitals reviewed.  Constitutional:      General: She is awake. She is not in acute distress.    Appearance: Normal appearance. She is well-developed. She is not ill-appearing.     Comments: Very pleasant female stated age no acute distress sitting comfortable in exam room  HENT:     Head: Normocephalic and atraumatic.  Cardiovascular:     Rate and Rhythm: Normal rate and regular rhythm.     Heart sounds: Normal heart sounds, S1 normal and S2 normal. No murmur heard. Pulmonary:     Effort: Pulmonary effort is normal.     Breath sounds: Normal breath sounds. No wheezing, rhonchi or rales.     Comments: Clear to auscultation bilaterally Abdominal:     Palpations: Abdomen is soft.     Tenderness: There is no abdominal tenderness.  Skin:    Findings: Rash present. Rash is macular and papular.     Comments: Maculopapular rash with significant excoriation noted shoulder, back, abdomen, extremities.  Psychiatric:        Behavior: Behavior is cooperative.      UC Treatments / Results  Labs (all labs ordered are listed, but only abnormal results are displayed) Labs Reviewed - No data to display  EKG   Radiology No results found.  Procedures Procedures (including critical care time)  Medications Ordered in UC Medications  - No data to display  Initial Impression / Assessment and Plan / UC Course  I have reviewed the triage vital signs and the nursing notes.  Pertinent labs & imaging results that were available during my care of the patient were reviewed by me and considered in my medical decision making (see chart for details).     Patient is well-appearing, afebrile, nontoxic, nontachycardic.  Symptoms do appear consistent with a contact dermatitis however discussed that I am not sure what triggered this.  We will treat with prednisone  as this has been what has been effective in the past.  We discussed that she should not take NSAIDs with this medication due to risk of GI bleeding.  Recommended hypoallergenic soaps and detergents as well as avoiding any new exposures.  If her symptoms are not improving quickly within 3 to 5 days or if anything worsens and she has worsening rash, shortness of breath, nausea/vomiting, fever she needs to be seen emergently.  Strict return precautions given.  All questions answered to patient's satisfaction.   Final Clinical Impressions(s) / UC Diagnoses   Final diagnoses:  Pruritic rash  Allergic contact dermatitis, unspecified trigger     Discharge Instructions      Start prednisone  as prescribed.  Do not take NSAIDs with this medication including aspirin , ibuprofen/Advil, naproxen/Aleve.  You can use acetaminophen /Tylenol  as needed.  Use hypoallergenic soaps and detergents.  Avoid any new exposures.  Follow-up with your primary care next week if your symptoms have not resolved.  If anything worsens and you have spreading rash, fever, nausea, vomiting, shortness of breath you need to be seen immediately.     ED Prescriptions     Medication Sig Dispense Auth. Provider   predniSONE  (STERAPRED UNI-PAK 21 TAB) 10 MG (21) TBPK tablet As directed 21 tablet Narya Beavin K, PA-C      PDMP not reviewed this encounter.   Sherrell Rocky POUR, PA-C 10/18/23 1315

## 2023-10-18 NOTE — Discharge Instructions (Addendum)
 Start prednisone  as prescribed.  Do not take NSAIDs with this medication including aspirin , ibuprofen/Advil, naproxen/Aleve.  You can use acetaminophen /Tylenol  as needed.  Use hypoallergenic soaps and detergents.  Avoid any new exposures.  Follow-up with your primary care next week if your symptoms have not resolved.  If anything worsens and you have spreading rash, fever, nausea, vomiting, shortness of breath you need to be seen immediately.

## 2023-10-30 ENCOUNTER — Other Ambulatory Visit: Payer: Self-pay

## 2023-10-30 MED ORDER — COVID-19 MRNA VAC-TRIS(PFIZER) 30 MCG/0.3ML IM SUSY: 0.3000 mL | PREFILLED_SYRINGE | Freq: Once | INTRAMUSCULAR | 0 refills | Status: AC

## 2023-10-30 NOTE — Telephone Encounter (Signed)
 Copied from CRM [id number]. Topic: Clinical - Medication Question >> Oct 30, 2023 10:19 AM Kathrin PARAS wrote: Reason for CRM: Patient wants to get a prescription a for the covid and flu vaccine for  [facility] [address] [place], [place] [address] Phone: [phone]

## 2023-11-03 ENCOUNTER — Telehealth: Payer: Self-pay

## 2023-11-03 NOTE — Telephone Encounter (Signed)
 Last injection 08/2023 %80 relief/function ability Duration of relief/improvement--2 months Current pain score---3 Recent falls or injuries---None Same location and same pain as last time

## 2023-11-06 ENCOUNTER — Other Ambulatory Visit: Payer: Self-pay | Admitting: Physical Medicine and Rehabilitation

## 2023-11-06 DIAGNOSIS — M5416 Radiculopathy, lumbar region: Secondary | ICD-10-CM

## 2023-11-10 DIAGNOSIS — M545 Low back pain, unspecified: Secondary | ICD-10-CM | POA: Diagnosis not present

## 2023-11-10 DIAGNOSIS — M5416 Radiculopathy, lumbar region: Secondary | ICD-10-CM | POA: Diagnosis not present

## 2023-11-22 DIAGNOSIS — M545 Low back pain, unspecified: Secondary | ICD-10-CM | POA: Diagnosis not present

## 2023-11-22 DIAGNOSIS — M5416 Radiculopathy, lumbar region: Secondary | ICD-10-CM | POA: Diagnosis not present

## 2023-11-30 ENCOUNTER — Other Ambulatory Visit: Payer: Self-pay

## 2023-11-30 ENCOUNTER — Ambulatory Visit: Admitting: Physical Medicine and Rehabilitation

## 2023-11-30 VITALS — BP 127/83 | HR 84

## 2023-11-30 DIAGNOSIS — M5416 Radiculopathy, lumbar region: Secondary | ICD-10-CM

## 2023-11-30 MED ORDER — METHYLPREDNISOLONE ACETATE 80 MG/ML IJ SUSP
40.0000 mg | Freq: Once | INTRAMUSCULAR | Status: AC
  Administered 2023-11-30: 40 mg

## 2023-11-30 NOTE — Progress Notes (Signed)
 Charlotte Duarte - 83 y.o. female MRN [id number]  Date of birth: 1940/10/24  Office Visit Note: Visit Date: 11/30/2023 PCP: Perri Ronal PARAS, MD Referred by: Perri Ronal PARAS, MD  Subjective: Chief Complaint  Patient presents with   Lower Back - Pain   HPI:  Charlotte Duarte is a 83 y.o. female who comes in today for planned repeat Right L5-S1  Lumbar Interlaminar epidural steroid injection with fluoroscopic guidance.  The patient has failed conservative care including home exercise, medications, time and activity modification.  This injection will be diagnostic and hopefully therapeutic.  Please see requesting physician notes for further details and justification. Patient received more than 50% pain relief from prior injection.  She mainly has mild canal but moderate lateral recess stenosis at L4-5 and L5-S1. Suggest right L5 transforaminal injection depending on relief with injection.  Referring: Duwaine Pouch, FNP   ROS Otherwise per HPI.  Assessment & Plan: Visit Diagnoses:    ICD-10-CM   1. Lumbar radiculopathy  M54.16 XR C-ARM NO REPORT    Epidural Steroid injection    methylPREDNISolone  acetate (DEPO-MEDROL ) injection 40 mg      Plan: No additional findings.   Meds & Orders:  Meds ordered this encounter  Medications   methylPREDNISolone  acetate (DEPO-MEDROL ) injection 40 mg    Orders Placed This Encounter  Procedures   XR C-ARM NO REPORT   Epidural Steroid injection    Follow-up: Return for visit to requesting provider as needed.   Procedures: No procedures performed      Clinical History: MRI LUMBAR SPINE WITHOUT CONTRAST   TECHNIQUE: Multiplanar, multisequence MR imaging of the lumbar spine was performed. No intravenous contrast was administered.   COMPARISON:  Radiography 05/31/2023.  MRI 11/09/2016.   FINDINGS: Segmentation:  5 lumbar type vertebral bodies.   Alignment:  Degenerative anterolisthesis at L4-5 of 4 mm.   Vertebrae: Benign appearing hemangioma  within the T12 vertebral body.   Conus medullaris and cauda equina: Conus extends to the L1 level. Conus and cauda equina appear normal.   Paraspinal and other soft tissues: Chronic renal atrophy on the left.   Disc levels:   Mild non-compressive disc bulges from T10-11 through L2-3. Mild facet hypertrophy at L2-3 but no significant encroachment.   L3-4: Bulging of the disc. Bilateral facet and ligamentous hypertrophy. Mild narrowing of the lateral recesses and foramina but no definite neural compression. Similar appearance to the study of 2018.   L4-5: Chronic facet arthropathy with degenerative anterolisthesis of 4 mm. Mild bulging of the disc. Stenosis of both lateral recesses. Large synovial cyst seen in 2018 is not present currently. The lateral recess stenosis at this level could be symptomatic.   L5-S1: Bulging of the disc. Bilateral facet degeneration and hypertrophy. Stenosis of both subarticular lateral recesses would have some potential to affect the S1 nerves. Slight worsening since 2018.   IMPRESSION: 1. L4-5: Chronic facet arthropathy with 4 mm of anterolisthesis. Bulging of the disc. Stenosis of both lateral recesses that could possibly be symptomatic. Large synovial cyst seen in 2018 is not present currently. 2. L5-S1: Disc bulge. Facet degeneration and hypertrophy. Stenosis of both subarticular lateral recesses that would have some potential to affect the S1 nerves. Slight worsening since 2018. 3. L3-4: Disc bulge. Facet and ligamentous hypertrophy. Mild narrowing of the lateral recesses and foramina but without definite neural compression. Similar appearance to the study of 2018.     Electronically Signed   By: Oneil Cristal HERO.D.  On: 06/21/2023 15:35     Objective:  VS:  HT:    WT:   BMI:     BP:127/83  HR:84bpm  TEMP: ( )  RESP:  Physical Exam Vitals and nursing note reviewed.  Constitutional:      General: She is not in acute distress.     Appearance: Normal appearance. She is not ill-appearing.  HENT:     Head: Normocephalic and atraumatic.     Right Ear: External ear normal.     Left Ear: External ear normal.  Eyes:     Extraocular Movements: Extraocular movements intact.  Cardiovascular:     Rate and Rhythm: Normal rate.     Pulses: Normal pulses.  Pulmonary:     Effort: Pulmonary effort is normal. No respiratory distress.  Abdominal:     General: There is no distension.     Palpations: Abdomen is soft.  Musculoskeletal:        General: Tenderness present.     Cervical back: Neck supple.     Right lower leg: No edema.     Left lower leg: No edema.     Comments: Patient has good distal strength with no pain over the greater trochanters.  No clonus or focal weakness.  Skin:    Findings: No erythema, lesion or rash.  Neurological:     General: No focal deficit present.     Mental Status: She is alert and oriented to person, place, and time.     Sensory: No sensory deficit.     Motor: No weakness or abnormal muscle tone.     Coordination: Coordination normal.  Psychiatric:        Mood and Affect: Mood normal.        Behavior: Behavior normal.      Imaging: No results found.

## 2023-11-30 NOTE — Progress Notes (Signed)
 Pain Scale   Average Pain 3 Patient advising she ha chronic lower back pain radiating to right leg, pain increases when standing and decreases when resting.        +Driver, -BT, -Dye Allergies.

## 2023-12-19 DIAGNOSIS — M5416 Radiculopathy, lumbar region: Secondary | ICD-10-CM | POA: Diagnosis not present

## 2023-12-19 DIAGNOSIS — M545 Low back pain, unspecified: Secondary | ICD-10-CM | POA: Diagnosis not present

## 2023-12-25 ENCOUNTER — Encounter: Payer: Self-pay | Admitting: Radiology

## 2024-02-13 ENCOUNTER — Ambulatory Visit: Payer: Self-pay

## 2024-02-13 ENCOUNTER — Ambulatory Visit: Admitting: Family Medicine

## 2024-02-13 ENCOUNTER — Encounter: Payer: Self-pay | Admitting: Family Medicine

## 2024-02-13 VITALS — BP 124/74 | HR 81 | Wt 153.2 lb

## 2024-02-13 DIAGNOSIS — R052 Subacute cough: Secondary | ICD-10-CM | POA: Diagnosis not present

## 2024-02-13 MED ORDER — PREDNISONE 10 MG (21) PO TBPK: ORAL_TABLET | ORAL | 0 refills | Status: AC

## 2024-02-13 MED ORDER — DOXYCYCLINE HYCLATE 100 MG PO TABS: 100.0000 mg | ORAL_TABLET | Freq: Two times a day (BID) | ORAL | 0 refills | Status: AC

## 2024-02-13 NOTE — Telephone Encounter (Signed)
 FYI Only or Action Required?: FYI only for provider: appointment scheduled on 02/13/24.  Patient was last seen in primary care on 10/13/2023 by Perri Ronal PARAS, MD.  Called Nurse Triage reporting Wheezing and Cough.  Symptoms began 3 weeks ago.  Interventions attempted: OTC medications: Alka Seltzer Plus.  Symptoms are: gradually improving.  Triage Disposition: See PCP When Office is Open (Within 3 Days)  Patient/caregiver understands and will follow disposition?: Yes             Copied from CRM #[id number]. Topic: Clinical - Red Word Triage >> Feb 13, 2024  8:47 AM Emylou G wrote: [facility] that prompted transfer to Nurse Triage:  productive cough - especially when she moves / wheezing.. Reason for Disposition  [1] Nasal discharge AND [2] present > 10 days  Answer Assessment - Initial Assessment Questions 1. ONSET: When did the cough begin?      3 weeks ago.  2. SEVERITY: How bad is the cough today?      Occasional, improving cough but has not resolved. She states before it was persistent.  3. SPUTUM: Describe the color of your sputum (e.g., none, dry cough; clear, white, yellow, green)     Yellow.  4. HEMOPTYSIS: Are you coughing up any blood? If so ask: How much? (e.g., flecks, streaks, tablespoons, etc.)     No.  5. DIFFICULTY BREATHING: Are you having difficulty breathing? If Yes, ask: How bad is it? (e.g., mild, moderate, severe)      No.  6. FEVER: Do you have a fever? If Yes, ask: What is your temperature, how was it measured, and when did it start?     No.  7. CARDIAC HISTORY: Do you have any history of heart disease? (e.g., heart attack, congestive heart failure)      No.  8. LUNG HISTORY: Do you have any history of lung disease?  (e.g., pulmonary embolus, asthma, emphysema)     No.  9. PE RISK FACTORS: Do you have a history of blood clots? (or: recent major surgery, recent prolonged travel, bedridden)     No.  10. OTHER  SYMPTOMS: Do you have any other symptoms? (e.g., runny nose, wheezing, chest pain)       Wheezing when changing positions, slight nasal congestion. No chest pain, fever, difficulty breathing, earaches, runny nose.  11. PREGNANCY: Is there any chance you are pregnant? When was your last menstrual period?       N/A.  12. TRAVEL: Have you traveled out of the country in the last month? (e.g., travel history, exposures)       No.  Negative COVID and flu test 2 weeks ago. Treated at home with Alka Seltzer Plus.  Protocols used: Cough - Chronic-A-AH

## 2024-02-13 NOTE — Progress Notes (Signed)
" ° °  Name: Charlotte Duarte   Date of Visit: 02/13/2024   Date of last visit with me: Visit date not found   CHIEF COMPLAINT:  Chief Complaint  Patient presents with   Acute Visit    Cough, intermittent wheezing x 3 weeks.       HPI:  Discussed the use of AI scribe software for clinical note transcription with the patient, who gave verbal consent to proceed.  History of Present Illness   Charlotte Duarte is an 83 year old female who presents with a persistent cough for three weeks.  She has experienced a persistent cough for the past three weeks. Initially, during the first week, she felt unwell and spent most of her time in bed. No fever was noted during this period. Her husband had similar symptoms before she did.  She occasionally feels short of breath and has experienced wheezing, particularly while in bed.  She has a known allergy to amoxicillin , which causes nausea, and a severe reaction to Keflex , which has previously required hospitalization. She prefers to avoid Augmentin  due to these past experiences.         OBJECTIVE:       10/13/2023   11:35 AM  Depression screen PHQ 2/9  Decreased Interest 0  Down, Depressed, Hopeless 0  PHQ - 2 Score 0     BP Readings from Last 3 Encounters:  02/13/24 124/74  11/30/23 127/83  10/18/23 127/71    BP 124/74   Pulse 81   Wt 153 lb 3.2 oz (69.5 kg)   SpO2 97%   BMI 29.92 kg/m    Physical Exam   CHEST: No wheezing      Physical Exam Constitutional:      Appearance: Normal appearance.  Cardiovascular:     Rate and Rhythm: Normal rate and regular rhythm.     Pulses: Normal pulses.     Heart sounds: Normal heart sounds.  Pulmonary:     Effort: Pulmonary effort is normal. No respiratory distress.     Breath sounds: Normal breath sounds. No wheezing.  Abdominal:     General: Abdomen is flat.     Palpations: Abdomen is soft.  Neurological:     General: No focal deficit present.     Mental Status: She is alert and  oriented to person, place, and time. Mental status is at baseline.     ASSESSMENT/PLAN:   Assessment & Plan Subacute cough    Assessment and Plan    Subacute cough Persisting cough for three weeks, likely viral origin with possible bacterial superinfection. No pneumonia signs. Occasional wheezing noted. - Prescribed doxycycline  for 5 days due to allergies to amoxicillin  and Keflex . Discussed side effects including diarrhea and tendon weakness risk. - Advised against running or strenuous exercise while on doxycycline . - Prescribed steroid pack for use if symptoms persist post-antibiotics. - Instructed to start steroids if wheezing persists three days after starting antibiotics.         Pamella Samons A. Vita MD [facility] "

## 2024-02-14 ENCOUNTER — Telehealth: Payer: Self-pay | Admitting: Physical Medicine and Rehabilitation

## 2024-02-14 NOTE — Telephone Encounter (Signed)
 Pt called saying that they want to make an apt for a epidural injection. Call back number is [phone].

## 2024-02-19 ENCOUNTER — Other Ambulatory Visit: Payer: Self-pay | Admitting: Physical Medicine and Rehabilitation

## 2024-02-19 DIAGNOSIS — M5416 Radiculopathy, lumbar region: Secondary | ICD-10-CM

## 2024-02-23 ENCOUNTER — Telehealth: Payer: Self-pay | Admitting: Physical Medicine and Rehabilitation

## 2024-02-23 NOTE — Telephone Encounter (Signed)
 Pt checking the status of the injection. Please call to advise

## 2024-03-06 ENCOUNTER — Other Ambulatory Visit: Payer: Self-pay

## 2024-03-06 ENCOUNTER — Ambulatory Visit: Admitting: Physical Medicine and Rehabilitation

## 2024-03-06 VITALS — BP 121/70 | HR 91

## 2024-03-06 DIAGNOSIS — M5416 Radiculopathy, lumbar region: Secondary | ICD-10-CM

## 2024-03-06 MED ORDER — METHYLPREDNISOLONE ACETATE 40 MG/ML IJ SUSP
40.0000 mg | Freq: Once | INTRAMUSCULAR | Status: AC
  Administered 2024-03-06: 40 mg

## 2024-03-06 NOTE — Progress Notes (Signed)
 "  Charlotte Duarte - 84 y.o. female MRN [id number]  Date of birth: Aug 09, 1940  Office Visit Note: Visit Date: 03/06/2024 PCP: Perri Ronal PARAS, MD Referred by: Perri Ronal PARAS, MD  Subjective: Chief Complaint  Patient presents with   Lower Back - Pain   HPI:  Charlotte Duarte is a 84 y.o. female who comes in today for planned repeat Right L5-S1  Lumbar Interlaminar epidural steroid injection with fluoroscopic guidance.  The patient has failed conservative care including home exercise, medications, time and activity modification.  This injection will be diagnostic and hopefully therapeutic.  Please see requesting physician notes for further details and justification. Patient received more than 50% pain relief from prior injection. She asked about RFA and we had a discussion about the criteria we consider. Also depending on results may try S1 transforaminal injection.  Referring: Duwaine Pouch, FNP   ROS Otherwise per HPI.  Assessment & Plan: Visit Diagnoses:    ICD-10-CM   1. Lumbar radiculopathy  M54.16 XR C-ARM NO REPORT    Epidural Steroid injection    methylPREDNISolone  acetate (DEPO-MEDROL ) injection 40 mg      Plan: No additional findings.   Meds & Orders:  Meds ordered this encounter  Medications   methylPREDNISolone  acetate (DEPO-MEDROL ) injection 40 mg    Orders Placed This Encounter  Procedures   XR C-ARM NO REPORT   Epidural Steroid injection    Follow-up: Return for visit to requesting provider as needed.   Procedures: No procedures performed  Lumbar Epidural Steroid Injection - Interlaminar Approach with Fluoroscopic Guidance  Patient: Charlotte Duarte      Date of Birth: 12-Aug-1940 MRN: [id number] PCP: Perri Ronal PARAS, MD      Visit Date: 03/06/2024   Universal Protocol:     Consent Given By: the patient  Position: PRONE  Additional Comments: Vital signs were monitored before and after the procedure. Patient was prepped and draped in the usual sterile  fashion. The correct patient, procedure, and site was verified.   Injection Procedure Details:   Procedure diagnoses: Lumbar radiculopathy [M54.16]   Meds Administered:  Meds ordered this encounter  Medications   methylPREDNISolone  acetate (DEPO-MEDROL ) injection 40 mg     Laterality: Right  Location/Site:  L5-S1  Needle: 3.5 in., 20 ga. Tuohy  Needle Placement: Paramedian epidural  Findings:   -Comments: Excellent flow of contrast into the epidural space.  Procedure Details: Using a paramedian approach from the side mentioned above, the region overlying the inferior lamina was localized under fluoroscopic visualization and the soft tissues overlying this structure were infiltrated with 4 ml. of 1% Lidocaine  without Epinephrine. The Tuohy needle was inserted into the epidural space using a paramedian approach.   The epidural space was localized using loss of resistance along with counter oblique bi-planar fluoroscopic views.  After negative aspirate for air, blood, and CSF, a 2 ml. volume of Isovue-250 was injected into the epidural space and the flow of contrast was observed. Radiographs were obtained for documentation purposes.    The injectate was administered into the level noted above.   Additional Comments:  The patient tolerated the procedure well Dressing: 2 x 2 sterile gauze and Band-Aid    Post-procedure details: Patient was observed during the procedure. Post-procedure instructions were reviewed.  Patient left the clinic in stable condition.   Clinical History: MRI LUMBAR SPINE WITHOUT CONTRAST   TECHNIQUE: Multiplanar, multisequence MR imaging of the lumbar spine was performed. No intravenous contrast was administered.  COMPARISON:  Radiography 05/31/2023.  MRI 11/09/2016.   FINDINGS: Segmentation:  5 lumbar type vertebral bodies.   Alignment:  Degenerative anterolisthesis at L4-5 of 4 mm.   Vertebrae: Benign appearing hemangioma within the T12  vertebral body.   Conus medullaris and cauda equina: Conus extends to the L1 level. Conus and cauda equina appear normal.   Paraspinal and other soft tissues: Chronic renal atrophy on the left.   Disc levels:   Mild non-compressive disc bulges from T10-11 through L2-3. Mild facet hypertrophy at L2-3 but no significant encroachment.   L3-4: Bulging of the disc. Bilateral facet and ligamentous hypertrophy. Mild narrowing of the lateral recesses and foramina but no definite neural compression. Similar appearance to the study of 2018.   L4-5: Chronic facet arthropathy with degenerative anterolisthesis of 4 mm. Mild bulging of the disc. Stenosis of both lateral recesses. Large synovial cyst seen in 2018 is not present currently. The lateral recess stenosis at this level could be symptomatic.   L5-S1: Bulging of the disc. Bilateral facet degeneration and hypertrophy. Stenosis of both subarticular lateral recesses would have some potential to affect the S1 nerves. Slight worsening since 2018.   IMPRESSION: 1. L4-5: Chronic facet arthropathy with 4 mm of anterolisthesis. Bulging of the disc. Stenosis of both lateral recesses that could possibly be symptomatic. Large synovial cyst seen in 2018 is not present currently. 2. L5-S1: Disc bulge. Facet degeneration and hypertrophy. Stenosis of both subarticular lateral recesses that would have some potential to affect the S1 nerves. Slight worsening since 2018. 3. L3-4: Disc bulge. Facet and ligamentous hypertrophy. Mild narrowing of the lateral recesses and foramina but without definite neural compression. Similar appearance to the study of 2018.     Electronically Signed   By: Oneil Officer M.D.   On: 06/21/2023 15:35     Objective:  VS:  HT:    WT:   BMI:     BP:121/70  HR:91bpm  TEMP: ( )  RESP:  Physical Exam Vitals and nursing note reviewed.  Constitutional:      General: She is not in acute distress.    Appearance:  Normal appearance. She is not ill-appearing.  HENT:     Head: Normocephalic and atraumatic.     Right Ear: External ear normal.     Left Ear: External ear normal.  Eyes:     Extraocular Movements: Extraocular movements intact.  Cardiovascular:     Rate and Rhythm: Normal rate.     Pulses: Normal pulses.  Pulmonary:     Effort: Pulmonary effort is normal. No respiratory distress.  Abdominal:     General: There is no distension.     Palpations: Abdomen is soft.  Musculoskeletal:        General: Tenderness present.     Cervical back: Neck supple.     Right lower leg: No edema.     Left lower leg: No edema.     Comments: Patient has good distal strength with no pain over the greater trochanters.  No clonus or focal weakness.  Skin:    Findings: No erythema, lesion or rash.  Neurological:     General: No focal deficit present.     Mental Status: She is alert and oriented to person, place, and time.     Sensory: No sensory deficit.     Motor: No weakness or abnormal muscle tone.     Coordination: Coordination normal.  Psychiatric:        Mood and Affect:  Mood normal.        Behavior: Behavior normal.      Imaging: XR C-ARM NO REPORT Result Date: 03/06/2024 Please see Notes tab for imaging impression.  "

## 2024-03-06 NOTE — Progress Notes (Signed)
 Patient is here to have Right L5-S1 interlaminar epidural steroid injection. Takes Tylenol  for pain.    Pain Scale   Average Pain 4     +Driver, -BT, -Dye Allergies.

## 2024-03-06 NOTE — Procedures (Signed)
 Lumbar Epidural Steroid Injection - Interlaminar Approach with Fluoroscopic Guidance  Patient: Charlotte Duarte      Date of Birth: 10/30/1940 MRN: [id number] PCP: Perri Ronal PARAS, MD      Visit Date: 03/06/2024   Universal Protocol:     Consent Given By: the patient  Position: PRONE  Additional Comments: Vital signs were monitored before and after the procedure. Patient was prepped and draped in the usual sterile fashion. The correct patient, procedure, and site was verified.   Injection Procedure Details:   Procedure diagnoses: Lumbar radiculopathy [M54.16]   Meds Administered:  Meds ordered this encounter  Medications   methylPREDNISolone  acetate (DEPO-MEDROL ) injection 40 mg     Laterality: Right  Location/Site:  L5-S1  Needle: 3.5 in., 20 ga. Tuohy  Needle Placement: Paramedian epidural  Findings:   -Comments: Excellent flow of contrast into the epidural space.  Procedure Details: Using a paramedian approach from the side mentioned above, the region overlying the inferior lamina was localized under fluoroscopic visualization and the soft tissues overlying this structure were infiltrated with 4 ml. of 1% Lidocaine  without Epinephrine. The Tuohy needle was inserted into the epidural space using a paramedian approach.   The epidural space was localized using loss of resistance along with counter oblique bi-planar fluoroscopic views.  After negative aspirate for air, blood, and CSF, a 2 ml. volume of Isovue-250 was injected into the epidural space and the flow of contrast was observed. Radiographs were obtained for documentation purposes.    The injectate was administered into the level noted above.   Additional Comments:  The patient tolerated the procedure well Dressing: 2 x 2 sterile gauze and Band-Aid    Post-procedure details: Patient was observed during the procedure. Post-procedure instructions were reviewed.  Patient left the clinic in stable condition.

## 2024-06-27 ENCOUNTER — Ambulatory Visit: Admitting: Internal Medicine
